# Patient Record
Sex: Male | Born: 1950
Health system: Southern US, Community
[De-identification: ages and names within clinical notes are randomized; demographics above are authoritative.]

## PROBLEM LIST (undated history)

## (undated) DIAGNOSIS — D759 Disease of blood and blood-forming organs, unspecified: Secondary | ICD-10-CM

## (undated) DIAGNOSIS — R200 Anesthesia of skin: Secondary | ICD-10-CM

## (undated) DIAGNOSIS — I2510 Atherosclerotic heart disease of native coronary artery without angina pectoris: Secondary | ICD-10-CM

## (undated) DIAGNOSIS — D696 Thrombocytopenia, unspecified: Secondary | ICD-10-CM

## (undated) DIAGNOSIS — L509 Urticaria, unspecified: Secondary | ICD-10-CM

## (undated) DIAGNOSIS — R339 Retention of urine, unspecified: Secondary | ICD-10-CM

## (undated) DIAGNOSIS — H269 Unspecified cataract: Secondary | ICD-10-CM

## (undated) DIAGNOSIS — R58 Hemorrhage, not elsewhere classified: Secondary | ICD-10-CM

## (undated) DIAGNOSIS — Z87438 Personal history of other diseases of male genital organs: Secondary | ICD-10-CM

## (undated) DIAGNOSIS — E785 Hyperlipidemia, unspecified: Secondary | ICD-10-CM

## (undated) DIAGNOSIS — K635 Polyp of colon: Secondary | ICD-10-CM

## (undated) DIAGNOSIS — F909 Attention-deficit hyperactivity disorder, unspecified type: Secondary | ICD-10-CM

## (undated) DIAGNOSIS — R0600 Dyspnea, unspecified: Secondary | ICD-10-CM

## (undated) DIAGNOSIS — G709 Myoneural disorder, unspecified: Secondary | ICD-10-CM

## (undated) DIAGNOSIS — Z87442 Personal history of urinary calculi: Secondary | ICD-10-CM

## (undated) DIAGNOSIS — N189 Chronic kidney disease, unspecified: Secondary | ICD-10-CM

## (undated) DIAGNOSIS — Z86718 Personal history of other venous thrombosis and embolism: Secondary | ICD-10-CM

## (undated) DIAGNOSIS — H5440 Blindness, one eye, unspecified eye: Secondary | ICD-10-CM

## (undated) DIAGNOSIS — T887XXA Unspecified adverse effect of drug or medicament, initial encounter: Secondary | ICD-10-CM

## (undated) DIAGNOSIS — T4145XA Adverse effect of unspecified anesthetic, initial encounter: Secondary | ICD-10-CM

## (undated) DIAGNOSIS — I10 Essential (primary) hypertension: Secondary | ICD-10-CM

## (undated) DIAGNOSIS — I499 Cardiac arrhythmia, unspecified: Secondary | ICD-10-CM

## (undated) DIAGNOSIS — T8859XA Other complications of anesthesia, initial encounter: Secondary | ICD-10-CM

## (undated) DIAGNOSIS — M1990 Unspecified osteoarthritis, unspecified site: Secondary | ICD-10-CM

## (undated) HISTORY — DX: Polyp of colon: K63.5

## (undated) HISTORY — PX: LAPAROTOMY: SHX154

## (undated) HISTORY — DX: Blindness, one eye, unspecified eye: H54.40

## (undated) HISTORY — DX: Thrombocytopenia, unspecified: D69.6

## (undated) HISTORY — DX: Anesthesia of skin: R20.0

## (undated) HISTORY — DX: Essential (primary) hypertension: I10

## (undated) HISTORY — DX: Urticaria, unspecified: L50.9

## (undated) HISTORY — DX: Unspecified adverse effect of drug or medicament, initial encounter: T88.7XXA

## (undated) HISTORY — DX: Hemorrhage, not elsewhere classified: R58

## (undated) HISTORY — DX: Other complications of anesthesia, initial encounter: T88.59XA

## (undated) HISTORY — PX: OTHER SURGICAL HISTORY: SHX169

## (undated) HISTORY — PX: COLONOSCOPY: SHX174

## (undated) HISTORY — DX: Hyperlipidemia, unspecified: E78.5

## (undated) HISTORY — DX: Personal history of urinary calculi: Z87.442

## (undated) HISTORY — DX: Unspecified osteoarthritis, unspecified site: M19.90

## (undated) HISTORY — DX: Personal history of other diseases of male genital organs: Z87.438

## (undated) HISTORY — DX: Adverse effect of unspecified anesthetic, initial encounter: T41.45XA

## (undated) HISTORY — DX: Myoneural disorder, unspecified: G70.9

## (undated) HISTORY — DX: Retention of urine, unspecified: R33.9

## (undated) HISTORY — DX: Unspecified cataract: H26.9

## (undated) HISTORY — DX: Chronic kidney disease, unspecified: N18.9

---
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
## 1982-03-02 HISTORY — PX: OTHER SURGICAL HISTORY: SHX169

## 2000-12-01 ENCOUNTER — Encounter: Admission: RE | Admit: 2000-12-01 | Discharge: 2000-12-01 | Payer: Self-pay | Admitting: Family Medicine

## 2000-12-01 ENCOUNTER — Encounter: Payer: Self-pay | Admitting: Family Medicine

## 2003-02-07 ENCOUNTER — Encounter: Payer: Self-pay | Admitting: Internal Medicine

## 2005-02-20 ENCOUNTER — Ambulatory Visit: Payer: Self-pay | Admitting: Internal Medicine

## 2005-02-27 ENCOUNTER — Ambulatory Visit: Payer: Self-pay | Admitting: Internal Medicine

## 2005-08-12 ENCOUNTER — Ambulatory Visit: Payer: Self-pay | Admitting: Internal Medicine

## 2005-12-10 ENCOUNTER — Ambulatory Visit: Payer: Self-pay | Admitting: Internal Medicine

## 2005-12-10 LAB — CONVERTED CEMR LAB
ALT: 32 units/L (ref 0–40)
AST: 31 units/L (ref 0–37)
Albumin: 4.2 g/dL (ref 3.5–5.2)
Alkaline Phosphatase: 91 units/L (ref 39–117)
BUN: 10 mg/dL (ref 6–23)
Basophils Absolute: 0 10*3/uL (ref 0.0–0.1)
Basophils Relative: 0.3 % (ref 0.0–1.0)
CO2: 30 meq/L (ref 19–32)
Calcium: 9.5 mg/dL (ref 8.4–10.5)
Chloride: 108 meq/L (ref 96–112)
Chol/HDL Ratio, serum: 4.9
Cholesterol: 177 mg/dL (ref 0–200)
Creatinine, Ser: 1.1 mg/dL (ref 0.4–1.5)
Eosinophil percent: 3.7 % (ref 0.0–5.0)
GFR calc non Af Amer: 74 mL/min
Glomerular Filtration Rate, Af Am: 90 mL/min/{1.73_m2}
Glucose, Bld: 97 mg/dL (ref 70–99)
HCT: 42.2 % (ref 39.0–52.0)
HDL: 35.8 mg/dL — ABNORMAL LOW (ref >39.0)
Hemoglobin: 14.4 g/dL (ref 13.0–17.0)
LDL Cholesterol: 125 mg/dL — ABNORMAL HIGH (ref 0–99)
Lymphocytes Relative: 27.1 % (ref 12.0–46.0)
MCHC: 34.2 g/dL (ref 30.0–36.0)
MCV: 91 fL (ref 78.0–100.0)
Monocytes Absolute: 0.3 10*3/uL (ref 0.2–0.7)
Monocytes Relative: 7.5 % (ref 3.0–11.0)
Neutro Abs: 2.5 10*3/uL (ref 1.4–7.7)
Neutrophils Relative %: 61.4 % (ref 43.0–77.0)
PSA: 0.41 ng/mL (ref 0.10–4.00)
Platelets: 145 10*3/uL — ABNORMAL LOW (ref 150–400)
Potassium: 4.6 meq/L (ref 3.5–5.1)
RBC: 4.63 M/uL (ref 4.22–5.81)
RDW: 12.6 % (ref 11.5–14.6)
Sodium: 143 meq/L (ref 135–145)
TSH: 1.68 microintl units/mL (ref 0.35–5.50)
Total Bilirubin: 0.7 mg/dL (ref 0.3–1.2)
Total Protein: 7.4 g/dL (ref 6.0–8.3)
Triglyceride fasting, serum: 79 mg/dL (ref 0–149)
VLDL: 16 mg/dL (ref 0–40)
WBC: 4.1 10*3/uL — ABNORMAL LOW (ref 4.5–10.5)

## 2005-12-16 ENCOUNTER — Ambulatory Visit: Payer: Self-pay | Admitting: Internal Medicine

## 2005-12-30 ENCOUNTER — Ambulatory Visit: Payer: Self-pay | Admitting: Gastroenterology

## 2006-01-04 ENCOUNTER — Ambulatory Visit: Payer: Self-pay | Admitting: Internal Medicine

## 2006-01-11 ENCOUNTER — Ambulatory Visit: Payer: Self-pay | Admitting: Gastroenterology

## 2006-01-11 ENCOUNTER — Encounter: Payer: Self-pay | Admitting: Internal Medicine

## 2006-02-24 ENCOUNTER — Ambulatory Visit: Payer: Self-pay | Admitting: Internal Medicine

## 2006-02-24 ENCOUNTER — Encounter: Admission: RE | Admit: 2006-02-24 | Discharge: 2006-02-24 | Payer: Self-pay | Admitting: Internal Medicine

## 2007-02-03 ENCOUNTER — Ambulatory Visit: Payer: Self-pay | Admitting: Internal Medicine

## 2007-02-03 DIAGNOSIS — I10 Essential (primary) hypertension: Secondary | ICD-10-CM | POA: Insufficient documentation

## 2007-02-03 DIAGNOSIS — Z87442 Personal history of urinary calculi: Secondary | ICD-10-CM

## 2007-07-04 ENCOUNTER — Ambulatory Visit: Payer: Self-pay | Admitting: Internal Medicine

## 2007-07-04 DIAGNOSIS — T887XXA Unspecified adverse effect of drug or medicament, initial encounter: Secondary | ICD-10-CM | POA: Insufficient documentation

## 2007-07-04 LAB — CONVERTED CEMR LAB
Bilirubin Urine: NEGATIVE
Blood in Urine, dipstick: NEGATIVE
Glucose, Urine, Semiquant: NEGATIVE
Ketones, urine, test strip: NEGATIVE
Nitrite: NEGATIVE
Protein, U semiquant: 30
Specific Gravity, Urine: >=1.03
Urobilinogen, UA: NEGATIVE
WBC Urine, dipstick: NEGATIVE
pH: 5

## 2007-09-14 ENCOUNTER — Ambulatory Visit: Payer: Self-pay | Admitting: Internal Medicine

## 2007-09-14 LAB — CONVERTED CEMR LAB
ALT: 26 units/L (ref 0–53)
AST: 26 units/L (ref 0–37)
Albumin: 3.9 g/dL (ref 3.5–5.2)
Alkaline Phosphatase: 82 units/L (ref 39–117)
BUN: 11 mg/dL (ref 6–23)
Basophils Absolute: 0 10*3/uL (ref 0.0–0.1)
Basophils Relative: 0.5 % (ref 0.0–1.0)
Bilirubin, Direct: 0.1 mg/dL (ref 0.0–0.3)
CO2: 30 meq/L (ref 19–32)
Calcium: 9.3 mg/dL (ref 8.4–10.5)
Chloride: 108 meq/L (ref 96–112)
Cholesterol: 178 mg/dL (ref 0–200)
Creatinine, Ser: 1.1 mg/dL (ref 0.4–1.5)
Eosinophils Absolute: 0.2 10*3/uL (ref 0.0–0.7)
Eosinophils Relative: 4.6 % (ref 0.0–5.0)
GFR calc Af Amer: 89 mL/min
GFR calc non Af Amer: 74 mL/min
Glucose, Bld: 103 mg/dL — ABNORMAL HIGH (ref 70–99)
HCT: 40.1 % (ref 39.0–52.0)
HDL: 34.5 mg/dL — ABNORMAL LOW (ref >39.0)
Hemoglobin: 13.9 g/dL (ref 13.0–17.0)
LDL Cholesterol: 124 mg/dL — ABNORMAL HIGH (ref 0–99)
Lymphocytes Relative: 24.7 % (ref 12.0–46.0)
MCHC: 34.7 g/dL (ref 30.0–36.0)
MCV: 90.8 fL (ref 78.0–100.0)
Monocytes Absolute: 0.4 10*3/uL (ref 0.1–1.0)
Monocytes Relative: 8.1 % (ref 3.0–12.0)
Neutro Abs: 2.9 10*3/uL (ref 1.4–7.7)
Neutrophils Relative %: 62.1 % (ref 43.0–77.0)
PSA: 0.29 ng/mL (ref 0.10–4.00)
Platelets: 139 10*3/uL — ABNORMAL LOW (ref 150–400)
Potassium: 4.3 meq/L (ref 3.5–5.1)
RBC: 4.42 M/uL (ref 4.22–5.81)
RDW: 13 % (ref 11.5–14.6)
Sodium: 144 meq/L (ref 135–145)
TSH: 1.95 microintl units/mL (ref 0.35–5.50)
Total Bilirubin: 1 mg/dL (ref 0.3–1.2)
Total CHOL/HDL Ratio: 5.2
Total Protein: 7.1 g/dL (ref 6.0–8.3)
Triglycerides: 97 mg/dL (ref 0–149)
VLDL: 19 mg/dL (ref 0–40)
WBC: 4.7 10*3/uL (ref 4.5–10.5)

## 2007-09-20 ENCOUNTER — Ambulatory Visit: Payer: Self-pay | Admitting: Internal Medicine

## 2007-09-20 DIAGNOSIS — E785 Hyperlipidemia, unspecified: Secondary | ICD-10-CM

## 2007-09-20 DIAGNOSIS — N400 Enlarged prostate without lower urinary tract symptoms: Secondary | ICD-10-CM

## 2008-03-16 ENCOUNTER — Ambulatory Visit: Payer: Self-pay | Admitting: Internal Medicine

## 2008-04-16 ENCOUNTER — Ambulatory Visit: Payer: Self-pay | Admitting: Internal Medicine

## 2008-04-16 DIAGNOSIS — R079 Chest pain, unspecified: Secondary | ICD-10-CM

## 2008-04-20 ENCOUNTER — Encounter: Payer: Self-pay | Admitting: Internal Medicine

## 2008-04-20 ENCOUNTER — Ambulatory Visit: Payer: Self-pay

## 2008-04-30 ENCOUNTER — Ambulatory Visit: Payer: Self-pay | Admitting: Internal Medicine

## 2008-08-09 ENCOUNTER — Ambulatory Visit: Payer: Self-pay | Admitting: Family Medicine

## 2008-08-09 DIAGNOSIS — R339 Retention of urine, unspecified: Secondary | ICD-10-CM

## 2008-08-09 DIAGNOSIS — L509 Urticaria, unspecified: Secondary | ICD-10-CM | POA: Insufficient documentation

## 2008-08-09 LAB — CONVERTED CEMR LAB
Bilirubin Urine: NEGATIVE
Blood in Urine, dipstick: NEGATIVE
Glucose, Urine, Semiquant: NEGATIVE
Ketones, urine, test strip: NEGATIVE
Nitrite: NEGATIVE
Protein, U semiquant: NEGATIVE
Specific Gravity, Urine: >=1.03
Urobilinogen, UA: 0.2
WBC Urine, dipstick: NEGATIVE
pH: 5

## 2008-08-30 ENCOUNTER — Ambulatory Visit: Payer: Self-pay | Admitting: Internal Medicine

## 2008-08-30 DIAGNOSIS — R109 Unspecified abdominal pain: Secondary | ICD-10-CM | POA: Insufficient documentation

## 2008-10-01 ENCOUNTER — Encounter: Payer: Self-pay | Admitting: Internal Medicine

## 2008-10-02 ENCOUNTER — Ambulatory Visit: Payer: Self-pay | Admitting: Cardiology

## 2008-10-16 ENCOUNTER — Encounter: Payer: Self-pay | Admitting: Cardiology

## 2008-10-16 ENCOUNTER — Encounter: Payer: Self-pay | Admitting: Internal Medicine

## 2008-10-23 ENCOUNTER — Telehealth: Payer: Self-pay | Admitting: Cardiology

## 2009-01-02 ENCOUNTER — Ambulatory Visit: Payer: Self-pay | Admitting: Cardiology

## 2009-01-30 ENCOUNTER — Encounter (INDEPENDENT_AMBULATORY_CARE_PROVIDER_SITE_OTHER): Payer: Self-pay | Admitting: *Deleted

## 2009-01-31 ENCOUNTER — Encounter: Payer: Self-pay | Admitting: Cardiology

## 2009-03-07 ENCOUNTER — Telehealth: Payer: Self-pay | Admitting: Cardiology

## 2009-03-08 ENCOUNTER — Encounter: Payer: Self-pay | Admitting: Cardiology

## 2009-04-18 ENCOUNTER — Telehealth: Payer: Self-pay | Admitting: Cardiology

## 2009-09-17 ENCOUNTER — Encounter: Payer: Self-pay | Admitting: Internal Medicine

## 2009-10-02 ENCOUNTER — Encounter: Payer: Self-pay | Admitting: Internal Medicine

## 2010-01-22 ENCOUNTER — Encounter: Payer: Self-pay | Admitting: Internal Medicine

## 2010-02-05 ENCOUNTER — Encounter: Payer: Self-pay | Admitting: Internal Medicine

## 2010-03-30 LAB — CONVERTED CEMR LAB
CRP, High Sensitivity: 2 (ref 0.00–5.00)
Cholesterol: 188 mg/dL (ref 0–200)
HDL: 41 mg/dL (ref >39.00)
LDL Cholesterol: 127 mg/dL — ABNORMAL HIGH (ref 0–99)
Total CHOL/HDL Ratio: 5
Triglycerides: 100 mg/dL (ref 0.0–149.0)
Troponin I: 0 ng/mL (ref <0.06)
VLDL: 20 mg/dL (ref 0.0–40.0)

## 2010-04-01 NOTE — Letter (Signed)
Summary: [facility]   Imported By: Maryln Gottron 09/24/2009 10:14:57  _____________________________________________________________________  External Attachment:    Type:   Image     Comment:   External Document

## 2010-04-01 NOTE — Progress Notes (Signed)
Summary: side effect from lipitor - rash  Phone Note Call from Patient Call back at Home Phone [phone] Call back at [phone]   Caller: Spouse Reason for Call: Talk to Nurse Details for Reason: Per pt wife calling, hubsand having side effect from medication , rash all over his body. pt on lipitor now. was on zocor before. inform wife i would send an urgent message around to nurse.  Initial call taken by: Lorne Skeens,  April 18, 2009 9:14 AM  Follow-up for Phone Call        I spoke with the pt's wife and the pt started Lipitor and has now developed a rash.  This rash is similar to what the pt developed while taking Zocor.  The pt did also have swelling around his eyes with this rash and the wife has notified the Allergist.  I told the pt's wife to stop Lipitor.    The pt also has follow-up labs (Lipid and Liver) on 05/14/09 and the pt's wife was wondering if he still needs this since he has had another reaction.  I will forward this message to Dr Shirlee Latch for review. Follow-up by: Julieta Gutting, RN, BSN,  April 18, 2009 9:50 AM     Appended Document: side effect from lipitor - rash Stop Lipitor.  Does not need lipids/LFTs now.  Has high lipoprotein(a) so CV risk is higher and would be good to be able to use statin.  Would have him ask his allergist if there is a statin that would be safe to take after rashes with Zocor and Lipitor and would ask if desensitization would be an option. [name]   Appended Document: side effect from lipitor - rash LMVM  Appended Document: side effect from lipitor - rash discussed with wife--pt off Lipitor and rash is improving--pt also stopped Norvasc on his own--pt to restart Norvasc and call back if rash does not continue to improve--pt will discuss other statin treatment with allergist--reviewed with Dr Shirlee Latch   Clinical Lists Changes  Medications: Removed medication of LIPITOR 10 MG TABS (ATORVASTATIN CALCIUM) one tablet daily

## 2010-04-01 NOTE — Progress Notes (Signed)
Summary: rash  Phone Note Call from Patient Call back at Home Phone [phone]   Caller: Patient Reason for Call: Talk to Nurse Summary of Call: pt believes he is having a reaction to meds.... rash on chest, legs and neck Initial call taken by: Migdalia Dk,  March 07, 2009 9:13 AM  Follow-up for Phone Call        spoke with pt, he has developed a rash that started "sunday. he is not taking any new meds or has not started any new soaps.  the rash is raised, red and itching. he states he had the same type of reaction in the past from lisinopril and it took several months for it to occur. will discuss with dr cooper DOD Debra Mathis, RN  March 07, 2009 9:34 AM   Additional Follow-up for Phone Call Additional follow up Details #1::        reviewed chart. pt has been on zocor, norvasc, and ASA for some time and is on no new meds. He will likely need to discontinue one medication at a time to find the culprit. Will forward to Dr McLean for review as this doesn't seem like an urgent issue. Additional Follow-up by: Michael David Cooper, MD,  March 07, 2009 4:14 PM     Appended Document: rash Have him hold Zocor for 5 days to see if the rash goes away.  Please call him after 5 days.  If rash does not resolve will restart Zocor and hold Norvasc  Appended Document: rash Pt. aware and verbalizes understanding.  Appended Document: rash talked with patient--pt feels rash is getting better although it hasn't completely resolved--I will follow-up with patient in 2 days to see if rash continues to improve  Appended Document: rash--Zocor talked with patient--rash almost completely resolved--no Zocor since January 6--pt asking if he should restart Zocor and see what happens--will forward to Dr McLean for review  Appended Document: rash Try taking Lipitor 10 mg daily instead.   Appended Document: rash talked with patient--he will start Lipitor 10mg daily--L/L profile  05-14-09   Clinical Lists Changes  Medications: Added new medication of LIPITOR 10 MG TABS (ATORVASTATIN CALCIUM) one tablet daily - Signed Rx of LIPITOR 10 MG TABS (ATORVASTATIN CALCIUM) one tablet daily;  #30 x 3;  Signed;  Entered by: Anne Lankford, RN, BSN;  Authorized by: Dalton McLean, MD;  Method [id number] [address], [place], [place]  [address], Ph: [phone] or [phone], Fax: [phone]    Prescriptions: LIPITOR 10 MG TABS (ATORVASTATIN CALCIUM) one tablet daily  #30 x 3   Entered by:   Anne Lankford, RN, BSN   Authorized by:   Dalton McLean, MD   Signed by:   Anne Lankford, RN, BSN on 03/18/2009   Method used:   [facility]        [id number]       [address]       [place], [place]  [address]       Ph: [phone] or [phone]       Fax: [phone]   RxID:   [id number]    Appended Document: rash    Clinical Lists Changes  Allergies: Added new allergy or adverse reaction of ZOCOR

## 2010-04-01 NOTE — Letter (Signed)
Summary: [facility]   Imported By: Maryln Gottron 10/21/2009 12:28:15  _____________________________________________________________________  External Attachment:    Type:   Image     Comment:   External Document

## 2010-04-01 NOTE — Letter (Signed)
Summary: [facility]   Imported By: Maryln Gottron 01/31/2010 12:30:39  _____________________________________________________________________  External Attachment:    Type:   Image     Comment:   External Document

## 2010-04-01 NOTE — Miscellaneous (Signed)
  Clinical Lists Changes  Medications: Removed medication of ZOCOR 40 MG TABS (SIMVASTATIN) 1 daily

## 2010-04-01 NOTE — Letter (Signed)
Summary: [facility]   Imported By: Kassie Mends 03/19/2009 08:57:08  _____________________________________________________________________  External Attachment:    Type:   Image     Comment:   External Document

## 2010-04-03 NOTE — Letter (Signed)
Summary: [facility]   Imported By: Lennie Odor 02/18/2010 15:31:58  _____________________________________________________________________  External Attachment:    Type:   Image     Comment:   External Document

## 2010-04-18 ENCOUNTER — Telehealth: Payer: Self-pay | Admitting: Cardiology

## 2010-04-23 NOTE — Progress Notes (Signed)
Summary: lab set up prior to appt  Phone Note Call from Patient Call back at Home Phone [phone]   Caller: [phone] Reason for Call: Talk to Nurse, Lab or Test Results Summary of Call: pt need lab work set up prior to office visit. Initial call taken by: Lorne Skeens,  April 18, 2010 9:24 AM  Follow-up for Phone Call        I discussed with pt-he has not had cholesterol done in the last 6 months--pt will come for lipid profile 05/12/10 prior to appt with Dr Shirlee Latch  05/15/10--pt states he cannot take STATINS

## 2010-05-12 ENCOUNTER — Encounter: Payer: Self-pay | Admitting: Cardiology

## 2010-05-12 ENCOUNTER — Other Ambulatory Visit: Payer: Self-pay | Admitting: Cardiology

## 2010-05-12 ENCOUNTER — Other Ambulatory Visit (INDEPENDENT_AMBULATORY_CARE_PROVIDER_SITE_OTHER): Payer: 59

## 2010-05-12 DIAGNOSIS — I10 Essential (primary) hypertension: Secondary | ICD-10-CM

## 2010-05-12 DIAGNOSIS — E785 Hyperlipidemia, unspecified: Secondary | ICD-10-CM

## 2010-05-12 LAB — LIPID PANEL: Total CHOL/HDL Ratio: 5

## 2010-05-15 ENCOUNTER — Encounter: Payer: Self-pay | Admitting: Cardiology

## 2010-05-15 ENCOUNTER — Ambulatory Visit (INDEPENDENT_AMBULATORY_CARE_PROVIDER_SITE_OTHER): Payer: 59 | Admitting: Cardiology

## 2010-05-15 DIAGNOSIS — I10 Essential (primary) hypertension: Secondary | ICD-10-CM

## 2010-05-15 DIAGNOSIS — E7800 Pure hypercholesterolemia, unspecified: Secondary | ICD-10-CM

## 2010-05-20 NOTE — Assessment & Plan Note (Signed)
Summary: F2y. per pt wife call. gd   Visit Type:  Follow-up Primary Provider:  Dr. Amador Cunas  CC:  Sob.  History of Present Illness: 60 yo with HTN and family history of premature CAD returns for evaluation.  He had a rash associated with both atorvastatin and simvastatin.  He went to see an allergist for the rash and it resolved with a steroid inection.  He was told not to take statins.   In general, he is doing fairly well symptomatically.  No exertional chest pain.  He gets short of breath if he carries a load up a flight of steps but does fine when he climbs steps empty-handed.  No formal exercise but does a lot of yardwork.  BP at home is still running high, usually in the 140s/90s.  Patient also reports a distressed mood.  He has had problems with this for a number of  years.  Finally, he has been undergoing a rheumatological workup that has suggested that he has psoriatic arthritis  Labs (8/10): lipoprotein (a) 65, LLD 127, HDL 41 Labs (3/12): LDL 117, HDL 39  ECG: NSR, borderline LVH  Current Medications (verified): 1)  Norvasc 5 Mg Tabs (Amlodipine Besylate) .[name].. 1 Daily 2)  Aspirin Low Dose 81 Mg Tabs (Aspirin) .... Once Daily 3)  Fish Oil 1000 Mg Caps (Omega-3 Fatty Acids) .... Take 1 Capsule By Mouth Once A Day 4)  Coq10 100 Mg Caps (Coenzyme Q10) .... Take 1 Capsule By Mouth Once A Day 5)  Methotrexate 2.5 Mg Tabs (Methotrexate Sodium) .... 6 Tablets Once A Week 6)  Folic Acid 1 Mg Tabs (Folic Acid) .... Take 1 Tablet By Mouth Once A Day  Allergies: 1)  ! Penicillin G Sodium (Penicillin G Sodium) 2)  ! * Bystolic 3)  ! Lisinopril 4)  ! * Doxazosin 5)  ! Zocor  Past History:  Past Medical History: 1.  Hypertension: rash with nebivolol, depressed/ED with lisinopril 2.  Nephrolithiasis, hx of 1986 3.  blind  right eye, secondary to traumatic cataract 4.  history of  thrombocytopenia 5.  Hyperlipidemia with elevated lipoprotein (a): Rash with simvastatin and  atorvastatin.  6.  BPH 7.  ETT-myoview (2/10): 11'01", EF 58%, no ischemia/infarction 8. Psoriatic arthritis  Family History: Reviewed history from 09/24/2008 and no changes required. father died age 44, status post CABG at age 83.  History tobacco use, and diabetes Mother died age 43, COPD maternal aunt died at 7, CAD one brother  Social History: Reviewed history from 10/02/2008 and no changes required. Married twins age 60 Regular exercise-no Works as Chief Technology Officer.   Review of Systems       All systems reviewed and negative except as per HPI.   Vital Signs:  Patient profile:   60 year old male Height:      72 inches Weight:      230 pounds BMI:     31.31 Pulse rate:   55 / minute Pulse rhythm:   regular Resp:     18 per minute BP sitting:   140 / 96  (left arm) Cuff size:   large  Vitals Entered By: Vikki Ports (May 15, 2010 8:47 AM)  Physical Exam  General:  Well developed, well nourished, in no acute distress. Neck:  Neck supple, no JVD. No masses, thyromegaly or abnormal cervical nodes. Lungs:  Clear bilaterally to auscultation and percussion. Heart:  Non-displaced PMI, chest non-tender; regular rate and rhythm, S1, S2 without murmurs, rubs.  +S4.  Carotid upstroke normal, no bruit.  Pedals normal pulses. No edema, no varicosities. Abdomen:  Bowel sounds positive; abdomen soft and non-tender without masses, organomegaly, or hernias noted. No hepatosplenomegaly. Extremities:  No clubbing or cyanosis. Neurologic:  Alert and oriented x 3. Psych:  Normal affect.   Impression & Recommendations:  Problem # 1:  HYPERTENSION (ICD-401.9) BP still too high.  I will have him increase amlodipine to 10 mg daily.    Problem # 2:  HYPERLIPIDEMIA (ICD-272.4) Given his strong family history of premature CAD and his high lipoprotein(a), I think that we should aim for LDL < 100 in this patient.  He had severe rashes with both Lipitor and Zocor and was told by his  allergist not to take statins anymore.  I will instead start him on Niaspan at 500 mg daily.  If he tolerates this, increase to 1000 mg daily.  Check lipids/LFTs in 2 months.   Patient Instructions: 1)  Your physician has recommended you make the following change in your medication:  2)  Increase Amlodipine to 10mg  daily. 3)  Start Niaspan 500mg  at bedtime 30 minutes after you take aspirin-if you tolerate this dose in  1 week increase to 1000mg  (two 500mg  tablets) at bedtime after you take aspirin.  4)  Your physician recommends that you return for a FASTING lipid profile in 2 months---272.0  401.9 5)  Your physician wants you to follow-up in: 1 year with Dr Shirlee Latch.[facility] 2013)   You will receive a reminder letter in the mail two months in advance. If you don't receive a letter, please call our office to schedule the follow-up appointment. Prescriptions: NIASPAN 500 MG CR-TABS (NIACIN (ANTIHYPERLIPIDEMIC)) one at bedtime  30 minutes after you take aspirin-in 1 week increase to two tablets at bedtime 30 minutes after you take aspirin  #60 x 6   Entered by:   Katina Dung, RN, BSN   Authorized by:   Marca Ancona, MD   Signed by:   Katina Dung, RN, BSN on 05/15/2010   Method used:   [facility]  [place] [address] [facility]       [address]       [place], [place]  [address]       Ph: [phone] or [phone]       Fax: [phone]   RxID:   [id number] AMLODIPINE BESYLATE 10 MG TABS (AMLODIPINE BESYLATE) one daily  #30 x 6   Entered by:   Katina Dung, RN, BSN   Authorized by:   Marca Ancona, MD   Signed by:   Katina Dung, RN, BSN on 05/15/2010   Method used:   [facility]  [place] [address]       [address]       [place], [place]  [address]       Ph: [phone] or [phone]       Fax: [phone]   RxID:   [id number]

## 2010-06-01 ENCOUNTER — Other Ambulatory Visit: Payer: Self-pay | Admitting: Cardiology

## 2010-07-14 ENCOUNTER — Encounter: Payer: Self-pay | Admitting: Cardiology

## 2010-07-14 ENCOUNTER — Encounter: Payer: Self-pay | Admitting: *Deleted

## 2010-07-16 ENCOUNTER — Other Ambulatory Visit (INDEPENDENT_AMBULATORY_CARE_PROVIDER_SITE_OTHER): Payer: 59 | Admitting: *Deleted

## 2010-07-16 DIAGNOSIS — E7800 Pure hypercholesterolemia, unspecified: Secondary | ICD-10-CM

## 2010-07-16 DIAGNOSIS — I10 Essential (primary) hypertension: Secondary | ICD-10-CM

## 2010-07-16 LAB — LIPID PANEL
LDL Cholesterol: 84 mg/dL (ref 0–99)
Total CHOL/HDL Ratio: 3
Triglycerides: 85 mg/dL (ref 0.0–149.0)
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
## 2011-07-13 ENCOUNTER — Telehealth: Payer: Self-pay | Admitting: Cardiology

## 2011-07-13 MED ORDER — AMLODIPINE BESYLATE 10 MG PO TABS: 10.0000 mg | ORAL_TABLET | Freq: Every day | ORAL | Status: DC

## 2011-07-13 NOTE — Telephone Encounter (Signed)
Pt needs refill on amlodipine 10 mg, uses [facility] [place]

## 2011-07-13 NOTE — Telephone Encounter (Signed)
Sent to [facility] [place]--pt needs office visit before additional refills

## 2011-08-18 ENCOUNTER — Encounter: Payer: Self-pay | Admitting: *Deleted

## 2011-08-27 ENCOUNTER — Ambulatory Visit (INDEPENDENT_AMBULATORY_CARE_PROVIDER_SITE_OTHER): Payer: 59 | Admitting: Cardiology

## 2011-08-27 ENCOUNTER — Encounter: Payer: Self-pay | Admitting: Cardiology

## 2011-08-27 VITALS — BP 124/80 | HR 52 | Ht 72.0 in | Wt 228.8 lb

## 2011-08-27 DIAGNOSIS — R5383 Other fatigue: Secondary | ICD-10-CM

## 2011-08-27 DIAGNOSIS — R5381 Other malaise: Secondary | ICD-10-CM

## 2011-08-27 DIAGNOSIS — R0602 Shortness of breath: Secondary | ICD-10-CM

## 2011-08-27 DIAGNOSIS — I10 Essential (primary) hypertension: Secondary | ICD-10-CM

## 2011-08-27 DIAGNOSIS — E785 Hyperlipidemia, unspecified: Secondary | ICD-10-CM

## 2011-08-27 MED ORDER — LISINOPRIL-HYDROCHLOROTHIAZIDE 20-12.5 MG PO TABS: 1.0000 | ORAL_TABLET | Freq: Every day | ORAL | Status: DC

## 2011-08-27 NOTE — Patient Instructions (Signed)
Your physician has requested that you have an exercise stress myoview. For further information please visit [email or web address] Please follow instruction sheet, as given.  Your physician wants you to follow-up in: 1 year with Dr. Shirlee Latch.  You will receive a reminder letter in the mail two months in advance. If you don't  receive a letter, please call our office to schedule the follow-up appointment.  Start taking Red Yeast Rice, can be purchased at [facility].  Stop taking Amlodipine.  Start Lisinopril/HCTZ 20/12.5mg  daily.  Your physician recommends that you return for lab work in: 10 days (09/07/2011) for:  Lipids/bmet/tsh  Thurston Hole will call you in about 1 week to follow up on blood pressure.

## 2011-08-28 DIAGNOSIS — R0609 Other forms of dyspnea: Secondary | ICD-10-CM | POA: Insufficient documentation

## 2011-08-28 DIAGNOSIS — R5383 Other fatigue: Secondary | ICD-10-CM | POA: Insufficient documentation

## 2011-08-28 NOTE — Progress Notes (Signed)
Patient ID: Willie Dominguez, male   DOB: February 01, 1951, 61 y.o.   MRN: [id number] PCP: Dr. Amador Cunas  61 yo with HTN and family history of premature CAD returns for evaluation.  Since I last saw him, he had to stop Niaspan due to an allergic reaction.  He also has not been able to take statins.  BP has been under control on amlodipine but he has developed peripheral edema that is at times painfully tight.  He has generalized fatigue.  He is short of breath walking up hills or up inclines.  His wife is worried that this seems to be becoming worse.  He gets pain under the left armpit on occasion when he is working in his yard.  No chest pain.    Labs (8/10): lipoprotein (a) 65, LLD 127, HDL 41  Labs (3/12): LDL 117, HDL 39  Labs (5/12): LDL 84, HDL 47  Allergies:  1) ! Penicillin G Sodium (Penicillin G Sodium)  2) ! * Bystolic  3) ! Lisinopril  4) ! * Doxazosin  5) ! Zocor   Past Medical History:  1. Hypertension: rash with nebivolol 2. Nephrolithiasis, hx of 1986  3. blind right eye, secondary to traumatic cataract  4. history of thrombocytopenia  5. Hyperlipidemia with elevated lipoprotein (a): Rash with simvastatin and atorvastatin. Unable to tolerate Niaspan.  6. BPH  7. ETT-myoview (2/10): 11'01", EF 58%, no ischemia/infarction  8. Psoriatic arthritis   Family History:  father died age 22, status post CABG at age 67. History tobacco use, and diabetes  Mother died age 37, COPD  maternal aunt died at 29, CAD  one brother   Social History:  Married  twins Regular exercise-no  Works as Chief Technology Officer.   Review of Systems  All systems reviewed and negative except as per HPI.

## 2011-08-28 NOTE — Assessment & Plan Note (Signed)
BP well-controlled with amlodipine but has developed lower extremity edema.  Stop amlodipine, try lisinopril/HCTZ 20/12.5 daily.  Needs BP check and BMET in 2 wks.

## 2011-08-28 NOTE — Assessment & Plan Note (Signed)
Will check lipids.  Patient has not tolerated Niaspan or several statins.  He does have a strong family history of CAD and elevated Lp(a).  He asks today about red yeast rice extract.  I think this would be reasonable to try.

## 2011-08-28 NOTE — Assessment & Plan Note (Signed)
Will additionally get CBC and TSH.

## 2011-08-28 NOTE — Assessment & Plan Note (Signed)
Somewhat increased exertional dyspnea over the last few months.  The patient's wife has noticed and is concerned.  Also, gets pain under left armpit with exertion.  Given his risk factors, I will arrange for ETT-myoview to assess for evidence of significant coronary disease.  He should continue ASA 81 mg daily.

## 2011-09-08 ENCOUNTER — Ambulatory Visit (HOSPITAL_COMMUNITY): Payer: 59 | Attending: Cardiology | Admitting: Radiology

## 2011-09-08 ENCOUNTER — Other Ambulatory Visit (INDEPENDENT_AMBULATORY_CARE_PROVIDER_SITE_OTHER): Payer: 59

## 2011-09-08 VITALS — BP 123/82 | Ht 72.0 in | Wt 225.0 lb

## 2011-09-08 DIAGNOSIS — R0989 Other specified symptoms and signs involving the circulatory and respiratory systems: Secondary | ICD-10-CM | POA: Insufficient documentation

## 2011-09-08 DIAGNOSIS — R5381 Other malaise: Secondary | ICD-10-CM | POA: Insufficient documentation

## 2011-09-08 DIAGNOSIS — Z8249 Family history of ischemic heart disease and other diseases of the circulatory system: Secondary | ICD-10-CM | POA: Insufficient documentation

## 2011-09-08 DIAGNOSIS — R0602 Shortness of breath: Secondary | ICD-10-CM

## 2011-09-08 DIAGNOSIS — M79609 Pain in unspecified limb: Secondary | ICD-10-CM | POA: Insufficient documentation

## 2011-09-08 DIAGNOSIS — E785 Hyperlipidemia, unspecified: Secondary | ICD-10-CM

## 2011-09-08 DIAGNOSIS — I10 Essential (primary) hypertension: Secondary | ICD-10-CM | POA: Insufficient documentation

## 2011-09-08 DIAGNOSIS — R110 Nausea: Secondary | ICD-10-CM | POA: Insufficient documentation

## 2011-09-08 DIAGNOSIS — R079 Chest pain, unspecified: Secondary | ICD-10-CM

## 2011-09-08 DIAGNOSIS — R0609 Other forms of dyspnea: Secondary | ICD-10-CM | POA: Insufficient documentation

## 2011-09-08 DIAGNOSIS — R0789 Other chest pain: Secondary | ICD-10-CM | POA: Insufficient documentation

## 2011-09-08 LAB — BASIC METABOLIC PANEL
BUN: 19 mg/dL (ref 6–23)
GFR: 71.67 mL/min (ref >60.00)
Potassium: 4.9 mEq/L (ref 3.5–5.1)
Sodium: 140 mEq/L (ref 135–145)

## 2011-09-08 LAB — LIPID PANEL
LDL Cholesterol: 116 mg/dL — ABNORMAL HIGH (ref 0–99)
Total CHOL/HDL Ratio: 4
VLDL: 15.8 mg/dL (ref 0.0–40.0)

## 2011-09-08 LAB — TSH: TSH: 2.88 u[IU]/mL (ref 0.35–5.50)

## 2011-09-08 MED ORDER — TECHNETIUM TC 99M TETROFOSMIN IV KIT
30.0000 | PACK | Freq: Once | INTRAVENOUS | Status: AC | PRN
  Administered 2011-09-08: 30 via INTRAVENOUS

## 2011-09-08 MED ORDER — TECHNETIUM TC 99M TETROFOSMIN IV KIT
10.0000 | PACK | Freq: Once | INTRAVENOUS | Status: AC | PRN
  Administered 2011-09-08: 10 via INTRAVENOUS

## 2011-09-08 NOTE — Progress Notes (Addendum)
[facility] [place] SITE 3 NUCLEAR MED [address] [place] [address] [phone]  Cardiology Nuclear Med Study  Willie Dominguez is a 61 y.o. male     MRN : [id number]     DOB: September 09, 1950  Procedure Date: 09/08/2011  Nuclear Med Background Indication for Stress Test:  Evaluation for Ischemia History:  '10 MPS:EF=58%,no ischemia/scar Cardiac Risk Factors: Family History - CAD, Hypertension and Lipids  Symptoms:  Chest Pain/Tightness at Rest/ with Exertion (last date of chest discomfort month ago),Left arm pit pain/under ribs with exertion, DOE, Fatigue and Nausea   Nuclear Pre-Procedure Caffeine/Decaff Intake:  None > 12 hrs NPO After: 10:00pm   Lungs:  clear  IV 0.9% NS with Angio Cath:  20g  IV Site: R Antecubital x 1, tolerated well IV Started by:  Irean Hong, RN  Chest Size (in):  44 Cup Size: n/a  Height: 6' (1.829 m)  Weight:  225 lb (102.059 kg)  BMI:  Body mass index is 30.52 kg/(m^2). Tech Comments:  n/a    Nuclear Med Study 1 or 2 day study: 1 day  Stress Test Type:  Stress  Reading MD: Marca Ancona, MD  Order Authorizing Provider:  Marca Ancona, MD  Resting Radionuclide: Technetium 55m Tetrofosmin  Resting Radionuclide Dose: 11.0 mCi   Stress Radionuclide:  Technetium 51m Tetrofosmin  Stress Radionuclide Dose: 32.4 mCi           Stress Protocol Rest HR: 49 Stress HR: 148  Rest BP: 123/82 Stress BP: 167/66  Exercise Time (min): 10:30 METS: 12.30   Predicted Max HR: 160 bpm % Max HR: 92.5 bpm Rate Pressure Product: 95284   Dose of Adenosine (mg):  n/a Dose of Lexiscan: n/a mg  Dose of Atropine (mg): n/a Dose of Dobutamine: n/a mcg/kg/min (at max HR)  Stress Test Technologist: Cathlyn Parsons, RN  Nuclear Technologist:  Domenic Polite, CNMT     Rest Procedure:  Myocardial perfusion imaging was performed at rest 45 minutes following the intravenous administration of Technetium 79m Tetrofosmin. Rest ECG: Sinus Bradycardia  Stress  Procedure:  The patient performed treadmill exercise using a Bruce  Protocol for 10:30 minutes. The patient stopped due to fatigue and target HR achieved. Patient had feeling in his chest which he described as a sensation;not pain. Patient became dizzy and had tunnel vision in recovery.   There were no significant ST-T wave changes. Patient had occasional PVC's. Technetium 76m Tetrofosmin was injected at peak exercise and myocardial perfusion imaging was performed after a brief delay. Stress ECG: No significant change from baseline ECG  QPS Raw Data Images:  Normal; no motion artifact; normal heart/lung ratio. Stress Images:  Normal homogeneous uptake in all areas of the myocardium. Rest Images:  Normal homogeneous uptake in all areas of the myocardium. Subtraction (SDS):  There is no evidence of scar or ischemia. Transient Ischemic Dilatation (Normal <1.22):  0.84 Lung/Heart Ratio (Normal <0.45):  0.32  Quantitative Gated Spect Images QGS EDV:  111 ml QGS ESV:  46 ml  Impression Exercise Capacity:  Excellent exercise capacity. BP Response:  Normal blood pressure response. Clinical Symptoms:  Mild chest discomfort and dyspnea.  ECG Impression:  No significant changes.  Comparison with Prior Nuclear Study: No significant change from previous study  Overall Impression:  Normal stress nuclear study.  LV Ejection Fraction: 59%.  LV Wall Motion:  NL LV Function; NL Wall Motion  Marca Ancona 09/08/2011  Normal study.  Please inform patient.   Willie Dominguez [employer]  09/10/2011       

## 2011-09-10 ENCOUNTER — Telehealth: Payer: Self-pay | Admitting: *Deleted

## 2011-09-10 NOTE — Progress Notes (Signed)
LMTCB at home # / mobile # not working

## 2011-09-10 NOTE — Telephone Encounter (Signed)
HYPERTENSION - Marca Ancona, MD 08/28/2011 12:09 AM Signed  BP well-controlled with amlodipine but has developed lower extremity edema. Stop amlodipine, try lisinopril/HCTZ 20/12.5 daily. Needs BP check and BMET in 2 wks.    09/10/11 LMTCB home / mobile # not working.

## 2011-09-10 NOTE — Progress Notes (Signed)
LMTCB-home #

## 2011-09-10 NOTE — Telephone Encounter (Signed)
Pt rtn call, pls call [phone]

## 2011-09-10 NOTE — Telephone Encounter (Signed)
LMTCB

## 2011-09-10 NOTE — Telephone Encounter (Signed)
LMTCB-home #

## 2011-09-10 NOTE — Progress Notes (Signed)
LMTCB

## 2011-09-11 ENCOUNTER — Other Ambulatory Visit: Payer: Self-pay | Admitting: *Deleted

## 2011-09-11 NOTE — Telephone Encounter (Signed)
Fu call °Patient returning your call °

## 2011-09-11 NOTE — Progress Notes (Signed)
Pt.notified

## 2011-09-11 NOTE — Telephone Encounter (Signed)
Spoke with pt about recent test results 

## 2011-09-18 ENCOUNTER — Telehealth: Payer: Self-pay | Admitting: *Deleted

## 2011-09-18 NOTE — Telephone Encounter (Signed)
09/18/11 spoke with pt. Recent BP readings: 08/29/11  130/74 54  08/30/11 125/70 53  08/31/11 128/80 53 09/01/11 132/77 55 09/02/11 121/68 59 09/04/11 131/86 56 09/05/11 137/74 53 09/06/11 125/68 62 09/07/11 143/88 53 09/09/11 127/80 65 09/11/11 152/57 57 I will forward to Dr Shirlee Latch for review.   HYPERTENSION - Marca Ancona, MD 08/28/2011 12:09 AM Signed  BP well-controlled with amlodipine but has developed lower extremity edema. Stop amlodipine, try lisinopril/HCTZ 20/12.5 daily. Needs BP check and BMET in 2 wks

## 2011-09-21 NOTE — Telephone Encounter (Signed)
BPs ok 

## 2011-09-23 NOTE — Telephone Encounter (Signed)
Spoke with pt

## 2011-10-11 ENCOUNTER — Other Ambulatory Visit: Payer: Self-pay | Admitting: Cardiology

## 2011-10-12 ENCOUNTER — Other Ambulatory Visit: Payer: Self-pay | Admitting: *Deleted

## 2012-02-11 ENCOUNTER — Ambulatory Visit (INDEPENDENT_AMBULATORY_CARE_PROVIDER_SITE_OTHER): Payer: 59 | Admitting: Internal Medicine

## 2012-02-11 ENCOUNTER — Encounter: Payer: Self-pay | Admitting: Internal Medicine

## 2012-02-11 VITALS — BP 138/80 | HR 68 | Temp 97.8°F | Resp 18 | Ht 71.0 in | Wt 228.0 lb

## 2012-02-11 DIAGNOSIS — Z0000 Encounter for general adult medical examination without abnormal findings: Secondary | ICD-10-CM

## 2012-02-11 MED ORDER — LISINOPRIL-HYDROCHLOROTHIAZIDE 20-12.5 MG PO TABS: 1.0000 | ORAL_TABLET | Freq: Every day | ORAL | Status: DC

## 2012-02-11 MED ORDER — LOSARTAN POTASSIUM 100 MG PO TABS: 100.0000 mg | ORAL_TABLET | Freq: Every day | ORAL | Status: DC

## 2012-02-11 NOTE — Progress Notes (Signed)
Subjective:    Patient ID: Willie Dominguez Roles, male    DOB: Jul 07, 1950, 61 y.o.   MRN: [id number]  HPI  61 year old patient who is seen today for a health maintenance examination. He has been seen by cardiology early this year and did have a negative nuclear stress test performed. He has treated hypertension. He has been on his present medication for about 6 months and over this time he has had some mild the numbness involving the toes. He has seen urology due to history of kidney stones. Today he has done fairly well. He does have a history of mild dyslipidemia but has been intolerant to statins. Generally done well today. He did have a colonoscopy in 2007. He does have a history of psoriatic arthritis and has seen rheumatology recently  Past Medical History  Diagnosis Date  . Unspecified essential hypertension   . Personal history of urinary calculi   . 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 Thrombocytopenia   . Other and unspecified hyperlipidemia      with elevated lipoprotein (a)  . History of BPH   . Arthritis   . H/O benign prostatic hypertrophy     mild  . Unspecified adverse effect of unspecified drug, medicinal and biological substance   . Retention of urine, unspecified   . Urticaria, unspecified     History   Social History  . Marital Status: Married    Spouse Name: N/A    Number of Children: 2  . Years of Education: N/A   Occupational History  . Chief Technology Officer    Social History Main Topics  . Smoking status: Never Smoker   . Smokeless tobacco: Not on file  . Alcohol Use: No  . Drug Use: No  . Sexually Active: Not on file   Other Topics Concern  . Not on file   Social History Narrative   Marriedtwins age 7Regular exercise-no    Past Surgical History  Procedure Date  . Laparotomy   . Inguinal herniorrhaphy  age 56   . Colonoscopy november 2007     Family History  Problem Relation Age of Onset  . COPD Mother   . Coronary artery  disease Maternal Aunt   . Diabetes Father   . Other Father     CABG    Allergies  Allergen Reactions  . Methotrexate Derivatives Other (See Comments)    Mouth ulcers  . Bystolic (Nebivolol Hcl)   . Doxazosin     REACTION: aggrivated prostate, weakend stream, bladder pain and irritation  . Lisinopril     REACTION: Intolerance.  Depressive mood, fatigue  . Penicillins     REACTION: rash  . Simvastatin     REACTION: rash  . Statins     Breaks out in full body rash with itching and heat    Current Outpatient Prescriptions on File Prior to Visit  Medication Sig Dispense Refill  . aspirin 81 MG tablet Take 81 mg by mouth daily.        . Coenzyme Q10 (COQ-10) 100 MG CAPS 1 tab po qd       . lisinopril-hydrochlorothiazide (PRINZIDE,ZESTORETIC) 20-12.5 MG per tablet Take 1 tablet by mouth daily.  30 tablet  11  . Omega-3 Fatty Acids (FISH OIL) 1000 MG CAPS 1 tab po qd       . Red Yeast Rice Extract 600 MG CAPS Two daily        BP 138/80  Pulse  68  Temp 97.8 F (36.6 C) (Oral)  Resp 18  Ht 5\' 11"  (1.803 m)  Wt 228 lb (103.42 kg)  BMI 31.80 kg/m2  SpO2 99%       Review of Systems  Constitutional: Negative for fever, chills, activity change, appetite change and fatigue.  HENT: Negative for hearing loss, ear pain, congestion, rhinorrhea, sneezing, mouth sores, trouble swallowing, neck pain, neck stiffness, dental problem, voice change, sinus pressure and tinnitus.   Eyes: Negative for photophobia, pain, redness and visual disturbance.  Respiratory: Negative for apnea, cough, choking, chest tightness, shortness of breath and wheezing.   Cardiovascular: Negative for chest pain, palpitations and leg swelling.  Gastrointestinal: Negative for nausea, vomiting, abdominal pain, diarrhea, constipation, blood in stool, abdominal distention, anal bleeding and rectal pain.  Genitourinary: Negative for dysuria, urgency, frequency, hematuria, flank pain, decreased urine volume, discharge,  penile swelling, scrotal swelling, difficulty urinating, genital sores and testicular pain.  Musculoskeletal: Negative for myalgias, back pain, joint swelling, arthralgias and gait problem.  Skin: Negative for color change, rash and wound.  Neurological: Positive for numbness. Negative for dizziness, tremors, seizures, syncope, facial asymmetry, speech difficulty, weakness, light-headedness and headaches.  Hematological: Negative for adenopathy. Does not bruise/bleed easily.  Psychiatric/Behavioral: Negative for suicidal ideas, hallucinations, behavioral problems, confusion, sleep disturbance, self-injury, dysphoric mood, decreased concentration and agitation. The patient is not nervous/anxious.        Objective:   Physical Exam  Constitutional: He appears well-developed and well-nourished.  HENT:  Head: Normocephalic and atraumatic.  Right Ear: External ear normal.  Left Ear: External ear normal.  Nose: Nose normal.  Mouth/Throat: Oropharynx is clear and moist.  Eyes: Conjunctivae normal and EOM are normal. Pupils are equal, round, and reactive to light. No scleral icterus.  Neck: Normal range of motion. Neck supple. No JVD present. No thyromegaly present.  Cardiovascular: Regular rhythm, normal heart sounds and intact distal pulses.  Exam reveals no gallop and no friction rub.   No murmur heard. Pulmonary/Chest: Effort normal and breath sounds normal. He exhibits no tenderness.  Abdominal: Soft. Bowel sounds are normal. He exhibits no distension and no mass. There is no tenderness.  Genitourinary: Prostate normal and penis normal.  Musculoskeletal: Normal range of motion. He exhibits no edema and no tenderness.  Lymphadenopathy:    He has no cervical adenopathy.  Neurological: He is alert. He has normal reflexes. No cranial nerve deficit. Coordination normal.  Skin: Skin is warm and dry. No rash noted.  Psychiatric: He has a normal mood and affect. His behavior is normal.           Assessment & Plan:   Preventive health examination Nonspecific paresthesias. Patient feels this is related to his present blood pressure medication. We'll substitute we'll start him for 30 days to see if there is any effect Hypertension well controlled Home blood pressure readings recommended recheck here in one year or as needed

## 2012-02-11 NOTE — Patient Instructions (Signed)

## 2013-02-14 ENCOUNTER — Other Ambulatory Visit: Payer: Self-pay | Admitting: Internal Medicine

## 2013-04-17 ENCOUNTER — Ambulatory Visit (INDEPENDENT_AMBULATORY_CARE_PROVIDER_SITE_OTHER): Payer: 59 | Admitting: Neurology

## 2013-04-17 ENCOUNTER — Encounter: Payer: Self-pay | Admitting: Neurology

## 2013-04-17 VITALS — BP 136/88 | HR 63 | Ht 72.0 in | Wt 259.0 lb

## 2013-04-17 DIAGNOSIS — G609 Hereditary and idiopathic neuropathy, unspecified: Secondary | ICD-10-CM | POA: Insufficient documentation

## 2013-04-17 NOTE — Progress Notes (Signed)
PATIENT: Willie Dominguez DOB: [birth year]  HISTORICAL  Willie Dominguez is a 63 years old right-handed Caucasian male, referred by his primary care physician Dr. Laveda Abbe for evaluation of bilateral feet paresthesia  He had past medical history of hypertension, about a year and half ago, in the summer of 2013, he began to notice bilateral toes numbness tingling, gradually worse over the past 18 months, becomes constant more pronounced numbness, and also very bothersome sometimes, burning pain, wearing socks, shoes, become irritable,  Over the past couple weeks, he also redescribe easily losing his footing, lost balance easily,   he denies significant low back pain, he denie bowel bladder incontinence,  He denies bilateral hands paresthesia, no neck pain,  Reported recent laboratory evaluation showed normal CMP, folic acid low vitamin D, and low vitamin B12, was put on by mouth supplement,  REVIEW OF SYSTEMS: Full 14 system review of systems performed and notable only for right eye blindness weight gain, ringing in ears  ALLERGIES: Allergies  Allergen Reactions  . Methotrexate Derivatives Other (See Comments)    Mouth ulcers  . Bystolic [Nebivolol Hcl]   . Doxazosin     REACTION: aggrivated prostate, weakend stream, bladder pain and irritation  . Lisinopril     REACTION: Intolerance.  Depressive mood, fatigue  . Penicillins     REACTION: rash  . Simvastatin     REACTION: rash  . Statins     Breaks out in full body rash with itching and heat    HOME MEDICATIONS: Current Outpatient Prescriptions on File Prior to Visit  Medication Sig Dispense Refill  . aspirin 81 MG tablet Take 81 mg by mouth daily.        . Coenzyme Q10 (COQ-10) 100 MG CAPS 1 tab po qd       . Omega-3 Fatty Acids (FISH OIL) 1000 MG CAPS 1 tab po qd       . Red Yeast Rice Extract 600 MG CAPS Two daily      . lisinopril-hydrochlorothiazide (PRINZIDE,ZESTORETIC) 20-12.5 MG per tablet Take 1 tablet  by mouth daily.  30 tablet  11   No current facility-administered medications on file prior to visit.    PAST MEDICAL HISTORY: Past Medical History  Diagnosis Date  . Unspecified essential hypertension   . Personal history of urinary calculi   . 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 Thrombocytopenia   . Other and unspecified hyperlipidemia      with elevated lipoprotein (a)  . History of BPH   . Arthritis   . H/O benign prostatic hypertrophy     mild  . Unspecified adverse effect of unspecified drug, medicinal and biological substance   . Retention of urine, unspecified   . Urticaria, unspecified   . High blood pressure   . Numbness of toes     PAST SURGICAL HISTORY: Past Surgical History  Procedure Laterality Date  . Laparotomy    . Inguinal herniorrhaphy  age 22    . Colonoscopy november 2007      FAMILY HISTORY: Family History  Problem Relation Age of Onset  . COPD Mother   . Coronary artery disease Maternal Aunt   . Diabetes Father   . Other Father     CABG  . Lung cancer Mother   . Heart Problems Father     SOCIAL HISTORY:  History   Social History  . Marital Status: Married    Spouse  Name: Willie Dominguez    Number of Children: 2  . Years of Education: college   Occupational History  . Corporate treasurer    Social History Main Topics  . Smoking status: Never Smoker   . Smokeless tobacco: Never Used  . Alcohol Use: No  . Drug Use: No  . Sexual Activity: Not on file   Other Topics Concern  . Not on file   Social History Narrative   Married lives at home with his wife (Willie Dominguez)    self employed.   College education   Right handed   Caffeine three cokes daily                    PHYSICAL EXAM   Filed Vitals:   04/17/13 0832  BP: 136/88  Pulse: 63  Height: 6' (1.829 m)  Weight: 259 lb (117.482 kg)    Not recorded    Body mass index is 35.12 kg/(m^2).   Generalized: In no acute distress  Neck: Supple, no  carotid bruits   Cardiac: Regular rate rhythm  Pulmonary: Clear to auscultation bilaterally  Musculoskeletal: No deformity  Neurological examination  Mentation: Alert oriented to time, place, history taking, and causual conversation  Cranial nerve II-XII: Left pupil was round reactive to light. Right eye was blind since childhood. Extraocular movements were full.  Visual field were full on confrontational test. Bilateral fundi were sharp.  Facial sensation and strength were normal. Hearing was intact to finger rubbing bilaterally. Uvula tongue midline.  Head turning and shoulder shrug and were normal and symmetric.Tongue protrusion into cheek strength was normal.  Motor: Normal tone, bulk and strength.  Sensory: length dependent decreased light touch and pinprick in the midfoot level, preserved vibratory sensation lasting for 3-4 seconds, preserved proprioception  .  Coordination: Normal finger to nose, heel-to-shin bilaterally there was no truncal ataxia  Gait: Rising up from seated position without assistance, normal stance, without trunk ataxia, moderate stride, good arm swing, smooth turning, able to perform tiptoe, and heel walking without difficulty.   Romberg signs: Negative  Deep tendon reflexes: Brachioradialis 2/2, biceps 2/2, triceps 2/2, patellar 2/2, Achilles 2/2, plantar responses were flexor bilaterally.   DIAGNOSTIC DATA (LABS, IMAGING, TESTING) - I reviewed patient records, labs, notes, testing and imaging myself where available.  Lab Results  Component Value Date   WBC 4.7 09/14/2007   HGB 13.9 09/14/2007   HCT 40.1 09/14/2007   MCV 90.8 09/14/2007   PLT 139* 09/14/2007      Component Value Date/Time   NA 140 09/08/2011 0828   K 4.9 09/08/2011 0828   CL 104 09/08/2011 0828   CO2 28 09/08/2011 0828   GLUCOSE 105* 09/08/2011 0828   GLUCOSE 97 12/10/2005 1120   BUN 19 09/08/2011 0828   CREATININE 1.1 09/08/2011 0828   CALCIUM 9.1 09/08/2011 0828   PROT 7.1 09/14/2007 0909    ALBUMIN 3.9 09/14/2007 0909   AST 26 09/14/2007 0909   ALT 26 09/14/2007 0909   ALKPHOS 82 09/14/2007 0909   BILITOT 1.0 09/14/2007 0909   GFRNONAA 74 09/14/2007 0909   GFRAA 89 09/14/2007 0909   Lab Results  Component Value Date   CHOL 174 09/08/2011   HDL 42.10 09/08/2011   LDLCALC 116* 09/08/2011   TRIG 79.0 09/08/2011   CHOLHDL 4 09/08/2011   No results found for this basename: HGBA1C   No results found for this basename: VITAMINB12   Lab Results  Component Value Date   TSH 2.88  09/08/2011    ASSESSMENT AND PLAN  Willie Dominguez is a 63 y.o. male   presenting with 1 and half years history of gradual onset bilateral toes numbness, there is mild length dependent sensory changes, with reported a history of vitamin B12 deficiency,  1.  most consistent with peripheral neuropathy, likely due to B12 deficiency, will also laboratory evaluation to look for possible other etiology 2. EMG nerve conduction study       Willie Dominguez, M.D. Ph.D.  [facility] [address] [place], [place] [address] [phone]

## 2013-04-18 LAB — SPECIMEN STATUS REPORT

## 2013-04-20 LAB — HGB A1C W/O EAG: Hgb A1c MFr Bld: 5.7 % — ABNORMAL HIGH (ref 4.8–5.6)

## 2013-04-20 LAB — COPPER, SERUM: COPPER: 109 ug/dL (ref 72–166)

## 2013-04-20 LAB — IFE AND PE, SERUM
ALPHA2 GLOB SERPL ELPH-MCNC: 0.7 g/dL (ref 0.4–1.2)
Albumin SerPl Elph-Mcnc: 3.8 g/dL (ref 3.2–5.6)
Albumin/Glob SerPl: 1.4 (ref 0.7–2.0)
Alpha 1: 0.2 g/dL (ref 0.1–0.4)
B-Globulin SerPl Elph-Mcnc: 1 g/dL (ref 0.6–1.3)
GAMMA GLOB SERPL ELPH-MCNC: 1 g/dL (ref 0.5–1.6)
GLOBULIN, TOTAL: 2.8 g/dL (ref 2.0–4.5)
IGM (IMMUNOGLOBULIN M), SRM: 182 mg/dL (ref 40–230)
IgA/Immunoglobulin A, Serum: 217 mg/dL (ref 91–414)
IgG (Immunoglobin G), Serum: 1002 mg/dL (ref 700–1600)
Total Protein: 6.6 g/dL (ref 6.0–8.5)

## 2013-04-20 LAB — RPR: RPR: NONREACTIVE

## 2013-04-20 LAB — C-REACTIVE PROTEIN: CRP: 1.6 mg/L (ref 0.0–4.9)

## 2013-04-20 LAB — VITAMIN B12: VITAMIN B 12: 317 pg/mL (ref 211–946)

## 2013-04-20 LAB — THYROID PANEL WITH TSH
FREE THYROXINE INDEX: 1.6 (ref 1.2–4.9)
T3 Uptake Ratio: 25 % (ref 24–39)
T4, Total: 6.2 ug/dL (ref 4.5–12.0)
TSH: 2.94 u[IU]/mL (ref 0.450–4.500)

## 2013-04-20 LAB — METHYLMALONIC ACID, SERUM: METHYLMALONIC ACID: 151 nmol/L (ref 0–378)

## 2013-04-20 LAB — VITAMIN B1

## 2013-04-20 LAB — HOMOCYSTEINE: Homocysteine: 13.1 umol/L (ref 0.0–15.0)

## 2013-04-28 NOTE — Progress Notes (Signed)
Quick Note:  Shared with patient normal lab results per Dr Rhea Belton Findings, he verbalized understanding ______

## 2013-05-02 ENCOUNTER — Ambulatory Visit (INDEPENDENT_AMBULATORY_CARE_PROVIDER_SITE_OTHER): Payer: 59 | Admitting: Neurology

## 2013-05-02 ENCOUNTER — Encounter (INDEPENDENT_AMBULATORY_CARE_PROVIDER_SITE_OTHER): Payer: Self-pay

## 2013-05-02 DIAGNOSIS — R202 Paresthesia of skin: Secondary | ICD-10-CM

## 2013-05-02 DIAGNOSIS — G609 Hereditary and idiopathic neuropathy, unspecified: Secondary | ICD-10-CM

## 2013-05-02 DIAGNOSIS — R209 Unspecified disturbances of skin sensation: Secondary | ICD-10-CM

## 2013-05-02 DIAGNOSIS — Z0289 Encounter for other administrative examinations: Secondary | ICD-10-CM

## 2013-05-05 ENCOUNTER — Encounter: Payer: Self-pay | Admitting: Neurology

## 2013-05-05 ENCOUNTER — Ambulatory Visit (INDEPENDENT_AMBULATORY_CARE_PROVIDER_SITE_OTHER): Payer: 59 | Admitting: Neurology

## 2013-05-05 VITALS — BP 120/71 | HR 65 | Ht 73.0 in | Wt 230.0 lb

## 2013-05-05 DIAGNOSIS — R202 Paresthesia of skin: Secondary | ICD-10-CM

## 2013-05-05 DIAGNOSIS — R209 Unspecified disturbances of skin sensation: Secondary | ICD-10-CM

## 2013-05-05 NOTE — Progress Notes (Signed)
  Patient was in right lateral recombinant position. Sterile technique. 1% lidocaine with epinephrine was used for local anesthesia. Punctuated skin biopsy was performed. 3 mm skin sample were obtained at at left foot, above left extensor digitorum brevis, and left lateral calf, 10 cm above lateral malleolus. Left lateral thigh, 20 cm below left superior iliac spine  Patient tolerated the procedure well.  The wound was covered with neosporin antibiotic cream and bandage.

## 2013-05-08 NOTE — Procedures (Signed)
   NCS (NERVE CONDUCTION STUDY) WITH EMG (ELECTROMYOGRAPHY) REPORT   STUDY DATE: March 9th 2015 PATIENT NAME: Willie Dominguez DOB: 1950/05/14 MRN: [id number]    TECHNOLOGIST: Laretta Alstrom  ELECTROMYOGRAPHER: Marcial Pacas M.D.  CLINICAL INFORMATION:  63 years old male, presenting with bilateral feet paresthesia for 18 months  FINDINGS: NERVE CONDUCTION STUDY: Bilateral peroneal sensory responses  were normal.  Bilateral peroneal to EDB, and tibial motor responses were normal. Bilateral tibial H. reflexes were normal and symmetric    NEEDLE ELECTROMYOGRAPHY: Selected needle examination was performed at right lower extremity muscles, right lumbosacral paraspinal muscles.   Needle examination of right tibialis anterior, tibialis posterior, peroneal longus, medial gastrocnemius, vastus lateralis was normal  There was no spontaneous activity at right lumbosacral paraspinal muscles, right L4, L5, S1  IMPRESSION:   This is a normal study. There is no electrodiagnostic evidence of large fiber peripheral neuropathy, or right lumbosacral radiculopathy    INTERPRETING PHYSICIAN:   Marcial Pacas M.D. Ph.D. [facility] [address], [place] [employer], [place] [address] [phone]

## 2013-07-26 ENCOUNTER — Telehealth: Payer: Self-pay | Admitting: Neurology

## 2013-07-26 NOTE — Telephone Encounter (Signed)
Patient calling to get Biopsy results.

## 2013-07-27 NOTE — Telephone Encounter (Signed)
Called and spoke with patient and he said that someone had called him with the results but was not available at the time, did not see any results from [facility] in [place]. Called  with Janett Billow at [facility] and asked to fax results to our office, stated that she would fax right over. Waiting for fax report. Biopsy results in and will give to Dr Krista Blue to review. Contacted patient to inform.

## 2013-07-27 NOTE — Telephone Encounter (Signed)
I have called Mr. Romney, with out reach him, Butch Penny please call him, skin biopsy is most consistent with small fiber neuropathy.

## 2013-08-03 NOTE — Telephone Encounter (Signed)
I have called him again, explained to the skin biopsy result, there was evidence of length dependent small fiber  neuropathy, most likely related to his abnormal glucose, A1c 5.7,  I have suggested contacting his primary care physician, for better glucose control, also diet, losing weight, exercise, callback office for worsening symptoms,

## 2013-08-03 NOTE — Telephone Encounter (Signed)
Spoke to patient and relayed skin biopsy most consistent with small fiber neuropathy, per Dr.Yan.    The patient would like the doctor to call him for better understanding, also about his side effects with the Gabapentin, and also has some concerns about possible MS dx.  Please call home number [phone].

## 2013-08-07 ENCOUNTER — Encounter: Payer: Self-pay | Admitting: Neurology

## 2013-08-14 ENCOUNTER — Other Ambulatory Visit: Payer: Self-pay | Admitting: Internal Medicine

## 2013-11-17 ENCOUNTER — Telehealth: Payer: Self-pay | Admitting: Internal Medicine

## 2013-11-17 NOTE — Telephone Encounter (Signed)
Pt's wife is aware. She will talk to pt and see if he wants to schedule back with Dr. Raliegh Ip

## 2013-11-17 NOTE — Telephone Encounter (Signed)
Sorry but I am too full to take him, thanks

## 2013-11-17 NOTE — Telephone Encounter (Signed)
ok 

## 2013-11-17 NOTE — Telephone Encounter (Signed)
Pt would like to transfer from Dr. Raliegh Ip to Dr. Sarajane Jews.  Please advise if okay.  Pt's spouse states pt does not want to see Dr. Raliegh Ip anymore.

## 2013-11-20 ENCOUNTER — Encounter: Payer: Self-pay | Admitting: Internal Medicine

## 2013-11-20 ENCOUNTER — Ambulatory Visit (INDEPENDENT_AMBULATORY_CARE_PROVIDER_SITE_OTHER): Payer: 59 | Admitting: Internal Medicine

## 2013-11-20 ENCOUNTER — Telehealth: Payer: Self-pay | Admitting: Internal Medicine

## 2013-11-20 VITALS — BP 130/90 | HR 59 | Temp 97.7°F | Resp 20 | Ht 73.0 in | Wt 240.0 lb

## 2013-11-20 DIAGNOSIS — G609 Hereditary and idiopathic neuropathy, unspecified: Secondary | ICD-10-CM

## 2013-11-20 DIAGNOSIS — Z23 Encounter for immunization: Secondary | ICD-10-CM

## 2013-11-20 DIAGNOSIS — E785 Hyperlipidemia, unspecified: Secondary | ICD-10-CM

## 2013-11-20 DIAGNOSIS — I10 Essential (primary) hypertension: Secondary | ICD-10-CM

## 2013-11-20 LAB — HEMOGLOBIN A1C: Hgb A1c MFr Bld: 5.4 % (ref 4.6–6.5)

## 2013-11-20 MED ORDER — LOSARTAN POTASSIUM 100 MG PO TABS: 100.0000 mg | ORAL_TABLET | Freq: Every day | ORAL | Status: DC

## 2013-11-20 NOTE — Patient Instructions (Addendum)
It is important that you exercise regularly, at least 20 minutes 3 to 4 times per week.  If you develop chest pain or shortness of breath seek  medical attention.  You need to lose weight.  Consider a lower calorie diet and regular exercise.  Return in 6 months for follow-up Diabetes and Exercise Exercising regularly is important. It is not just about losing weight. It has many health benefits, such as:  Improving your overall fitness, flexibility, and endurance.  Increasing your bone density.  Helping with weight control.  Decreasing your body fat.  Increasing your muscle strength.  Reducing stress and tension.  Improving your overall health. People with diabetes who exercise gain additional benefits because exercise:  Reduces appetite.  Improves the body's use of blood sugar (glucose).  Helps lower or control blood glucose.  Decreases blood pressure.  Helps control blood lipids (such as cholesterol and triglycerides).  Improves the body's use of the hormone insulin by:  Increasing the body's insulin sensitivity.  Reducing the body's insulin needs.  Decreases the risk for heart disease because exercising:  Lowers cholesterol and triglycerides levels.  Increases the levels of good cholesterol (such as high-density lipoproteins [HDL]) in the body.  Lowers blood glucose levels. YOUR ACTIVITY PLAN  Choose an activity that you enjoy and set realistic goals. Your health care provider or diabetes educator can help you make an activity plan that works for you. Exercise regularly as directed by your health care provider. This includes:  Performing resistance training twice a week such as push-ups, sit-ups, lifting weights, or using resistance bands.  Performing 150 minutes of cardio exercises each week such as walking, running, or playing sports.  Staying active and spending no more than 90 minutes at one time being inactive. Even short bursts of exercise are good for  you. Three 10-minute sessions spread throughout the day are just as beneficial as a single 30-minute session. Some exercise ideas include:  Taking the dog for a walk.  Taking the stairs instead of the elevator.  Dancing to your favorite song.  Doing an exercise video.  Doing your favorite exercise with a friend. RECOMMENDATIONS FOR EXERCISING WITH TYPE 1 OR TYPE 2 DIABETES   Check your blood glucose before exercising. If blood glucose levels are greater than 240 mg/dL, check for urine ketones. Do not exercise if ketones are present.  Avoid injecting insulin into areas of the body that are going to be exercised. For example, avoid injecting insulin into:  The arms when playing tennis.  The legs when jogging.  Keep a record of:  Food intake before and after you exercise.  Expected peak times of insulin action.  Blood glucose levels before and after you exercise.  The type and amount of exercise you have done.  Review your records with your health care provider. Your health care provider will help you to develop guidelines for adjusting food intake and insulin amounts before and after exercising.  If you take insulin or oral hypoglycemic agents, watch for signs and symptoms of hypoglycemia. They include:  Dizziness.  Shaking.  Sweating.  Chills.  Confusion.  Drink plenty of water while you exercise to prevent dehydration or heat stroke. Body water is lost during exercise and must be replaced.  Talk to your health care provider before starting an exercise program to make sure it is safe for you. Remember, almost any type of activity is better than none. Document Released: 05/09/2003 Document Revised: 07/03/2013 Document Reviewed: 07/26/2012 [employer] Patient Information  2015 [employer]. This information is not intended to replace advice given to you by your health care provider. Make sure you discuss any questions you have with your health care provider. Diabetes  [place] and Food It is important for you to manage your blood sugar (glucose) level. Your blood glucose level can be greatly affected by what you eat. Eating healthier foods in the appropriate amounts throughout the day at about the same time each day will help you control your blood glucose level. It can also help slow or prevent worsening of your diabetes mellitus. Healthy eating may even help you improve the level of your blood pressure and reach or maintain a healthy weight.  HOW CAN FOOD AFFECT ME? Carbohydrates Carbohydrates affect your blood glucose level more than any other type of food. Your dietitian will help you determine how many carbohydrates to eat at each meal and teach you how to count carbohydrates. Counting carbohydrates is important to keep your blood glucose at a healthy level, especially if you are using insulin or taking certain medicines for diabetes mellitus. Alcohol Alcohol can cause sudden decreases in blood glucose (hypoglycemia), especially if you use insulin or take certain medicines for diabetes mellitus. Hypoglycemia can be a life-threatening condition. Symptoms of hypoglycemia (sleepiness, dizziness, and disorientation) are similar to symptoms of having too much alcohol.  If your health care provider has given you approval to drink alcohol, do so in moderation and use the following guidelines:  Women should not have more than one drink per day, and men should not have more than two drinks per day. One drink is equal to:  12 oz of beer.  5 oz of wine.  1 oz of hard liquor.  Do not drink on an empty stomach.  Keep yourself hydrated. Have water, diet soda, or unsweetened iced tea.  Regular soda, juice, and other mixers might contain a lot of carbohydrates and should be counted. WHAT FOODS ARE NOT RECOMMENDED? As you make food choices, it is important to remember that all foods are not the same. Some foods have fewer nutrients per serving than other foods, even  though they might have the same number of calories or carbohydrates. It is difficult to get your body what it needs when you eat foods with fewer nutrients. Examples of foods that you should avoid that are high in calories and carbohydrates but low in nutrients include:  Trans fats (most processed foods list trans fats on the Nutrition Facts label).  Regular soda.  Juice.  Candy.  Sweets, such as cake, pie, doughnuts, and cookies.  Fried foods. WHAT FOODS CAN I EAT? Have nutrient-rich foods, which will nourish your body and keep you healthy. The food you should eat also will depend on several factors, including:  The calories you need.  The medicines you take.  Your weight.  Your blood glucose level.  Your blood pressure level.  Your cholesterol level. You also should eat a variety of foods, including:  Protein, such as meat, poultry, fish, tofu, nuts, and seeds (lean animal proteins are best).  Fruits.  Vegetables.  Dairy products, such as milk, cheese, and yogurt (low fat is best).  Breads, grains, pasta, cereal, rice, and beans.  Fats such as olive oil, trans fat-free margarine, canola oil, avocado, and olives. DOES EVERYONE WITH DIABETES MELLITUS HAVE THE SAME MEAL PLAN? Because every person with diabetes mellitus is different, there is not one meal plan that works for everyone. It is very important that you meet  with a dietitian who will help you create a meal plan that is just right for you. Document Released: 11/13/2004 Document Revised: 02/21/2013 Document Reviewed: 01/13/2013 [facility] Patient Information 2015 [place], [place]. This information is not intended to replace advice given to you by your health care provider. Make sure you discuss any questions you have with your health care provider. Diabetes, Eating Away From Home Sometimes, you might eat in a restaurant or have meals that are prepared by someone else. You can enjoy eating out. However, the portions in  restaurants may be much larger than needed. Listed below are some ideas to help you choose foods that will keep your blood glucose (sugar) in better control.  TIPS FOR EATING OUT  Know your meal plan and how many carbohydrate servings you should have at each meal. You may wish to carry a copy of your meal plan in your purse or wallet. Learn the foods included in each food group.  Make a list of restaurants near you that offer healthy choices. Take a copy of the carry-out menus to see what they offer. Then, you can plan what you will order ahead of time.  Become familiar with serving sizes by practicing them at home using measuring cups and spoons. Once you learn to recognize portion sizes, you will be able to correctly estimate the amount of total carbohydrate you are allowed to eat at the restaurant. Ask for a takeout box if the portion is more than you should have. When your food comes, leave the amount you should have on the plate, and put the rest in the takeout box before you start eating.  Plan ahead if your mealtime will be different from usual. Check with your caregiver to find out how to time meals and medicine if you are taking insulin.  Avoid high-fat foods, such as fried foods, cream sauces, high-fat salad dressings, or any added butter or margarine.  Do not be afraid to ask questions. Ask your server about the portion size, cooking methods, ingredients and if items can be substituted. Restaurants do not list all available items on the menu. You can ask for your main entree to be prepared using skim milk, oil instead of butter or margarine, and without gravy or sauces. Ask your waiter or waitress to serve salad dressings, gravy, sauces, margarine, and sour cream on the side. You can then add the amount your meal plan suggests.  Add more vegetables whenever possible.  Avoid items that are labeled "jumbo," "giant," "deluxe," or "supersized."  You may want to split an entre with someone  and order an extra side salad.  Watch for hidden calories in foods like croutons, bacon, or cheese.  Ask your server to take away the bread basket or chips from your table.  Order a dinner salad as an appetizer. You can eat most foods served in a restaurant. Some foods are better choices than others. Breads and Starches  Recommended: All kinds of bread (wheat, rye, white, oatmeal, [place], [place], raisin), hard or soft dinner rolls, frankfurter or hamburger buns, small bagels, small corn or whole-wheat flour tortillas.  Avoid: Frosted or glazed breads, butter rolls, egg or cheese breads, croissants, sweet rolls, pastries, coffee cake, glazed or frosted doughnuts, muffins. Crackers  Recommended: Animal crackers, graham, rye, saltine, oyster, and matzoth crackers. Bread sticks, melba toast, rusks, pretzels, popcorn (without fat), zwieback toast.  Avoid: High-fat snack crackers or chips. Buttered popcorn. Cereals  Recommended: Hot and cold cereals. Whole grains such as oatmeal  or shredded wheat are good choices.  Avoid: Sugar-coated or granola type cereals. Potatoes/Pasta/Rice/Beans  Recommended: Order baked, boiled, or mashed potatoes, rice or noodles without added fat, whole beans. Order gravies, butter, margarine, or sauces on the side so you can control the amount you add.  Avoid: Hash browns or fried potatoes. Potatoes, pasta, or rice prepared with cream or cheese sauce. Potato or pasta salads prepared with large amounts of dressing. Fried beans or fried rice. Vegetables  Recommended: Order steamed, baked, boiled, or stewed vegetables without sauces or extra fat. Ask that sauce be served on the side. If vegetables are not listed on the menu, ask what is available.  Avoid: Vegetables prepared with cream, butter, or cheese sauce. Fried vegetables. Salad Bars  Recommended: Many of the vegetables at a salad bar are considered "free." Use lemon juice, vinegar, or low-calorie salad  dressing (fewer than 20 calories per serving) as "free" dressings for your salad. Look for salad bar ingredients that have no added fat or sugar such as tomatoes, lettuce, cucumbers, broccoli, carrots, onions, and mushrooms.  Avoid: Prepared salads with large amounts of dressing, such as coleslaw, caesar salad, macaroni salad, bean salad, or carrot salad. Fruit  Recommended: Eat fresh fruit or fresh fruit salad without added dressing. A salad bar often offers fresh fruit choices, but canned fruit at a restaurant is usually packed in sugar or syrup.  Avoid: Sweetened canned or frozen fruits, plain or sweetened fruit juice. Fruit salads with dressing, sour cream, or sugar added to them. Meat and Meat Substitutes  Recommended: Order broiled, baked, roasted, or grilled meat, poultry, or fish. Trim off all visible fat. Do not eat the skin of poultry. The size stated on the menu is the raw weight. Meat shrinks by  in cooking (for example, 4 oz raw equals 3 oz cooked meat).  Avoid: Deep-fat fried meat, poultry, or fish. Breaded meats. Eggs  Recommended: Order soft, hard-cooked, poached, or scrambled eggs. Omelets may be okay, depending on what ingredients are added. Egg substitutes are also a good choice.  Avoid: Fried eggs, eggs prepared with cream or cheese sauce. Milk  Recommended: Order low-fat or fat-free milk according to your meal plan. Plain, nonfat yogurt or flavored yogurt with no sugar added may be used as a substitute for milk. Soy milk may also be used.  Avoid: Milk shakes or sweetened milk beverages. Soups and Combination Foods  Recommended: Clear broth or consomm are "free" foods and may be used as an appetizer. Broth-based soups with fat removed count as a starch serving and are preferred over cream soups. Soups made with beans or split peas may be eaten but count as a starch.  Avoid: Fatty soups, soup made with cream, cheese soup. Combination foods prepared with excessive  amounts of fat or with cream or cheese sauces. Desserts and Sweets  Recommended: Ask for fresh fruit. Sponge or angel food cake without icing, ice milk, no sugar added ice cream, sherbet, or frozen yogurt may fit into your meal plan occasionally.  Avoid: Pastries, puddings, pies, cakes with icing, custard, gelatin desserts. Fats and Oils  Recommended: Choose healthy fats such as olive oil, canola oil, or tub margarine, reduced fat or fat-free sour cream, cream cheese, avocado, or nuts.  Avoid: Any fats in excess of your allowed portion. Deep-fried foods or any food with a large amount of fat. Note: Ask for all fats to be served on the side, and limit your portion sizes according to your meal plan.  Document Released: 02/16/2005 Document Revised: 05/11/2011 Document Reviewed: 05/16/2013 [facility] Patient Information 2015 [place], [place]. This information is not intended to replace advice given to you by your health care provider. Make sure you discuss any questions you have with your health care provider.

## 2013-11-20 NOTE — Telephone Encounter (Signed)
Spoke to pt, told him need to continue Losartan 100 mg and watch diet and exercise. Will recheck blood sugar levels at next visit. Rx sent to pharmacy. Pt verbalized understanding.

## 2013-11-20 NOTE — Progress Notes (Signed)
Pre visit review using our clinic review tool, if applicable. No additional management support is needed unless otherwise documented below in the visit note. 

## 2013-11-20 NOTE — Telephone Encounter (Signed)
Pt needs clarification on which meds he is to continue. Pt thought he is oply to take one BP med and thought dr Raliegh Ip was going to give rx blood sugars well [facility] [address] Pt states his AVS is a little misleading,

## 2013-11-20 NOTE — Progress Notes (Signed)
Subjective:    Patient ID: Willie Dominguez, male    DOB: [birth year], 63 y.o.   MRN: [id number]  HPI 63 year old patient who is seen today in followup.  He has treated hypertension, which has been stable.  Remains on losartan. He has a history of a small vessel peripheral neuropathy, which continues to worsen.  He has been on gabapentin, but self discontinued after a dose increase, side effects. He has a history of mild dyslipidemia. Hemoglobin A1c was minimally elevated at 5 point 7.  He has had some occasional blood sugars greater than 100  Past Medical History  Diagnosis Date  . Unspecified essential hypertension   . Personal history of urinary calculi   . 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 Thrombocytopenia   . Other and unspecified hyperlipidemia      with elevated lipoprotein (a)  . History of BPH   . Arthritis   . H/O benign prostatic hypertrophy     mild  . Unspecified adverse effect of unspecified drug, medicinal and biological substance   . Retention of urine, unspecified   . Urticaria, unspecified   . High blood pressure   . Numbness of toes     History   Social History  . Marital Status: Married    Spouse Name: [name]    Number of Children: 2  . Years of Education: college   Occupational History  . Corporate treasurer    Social History Main Topics  . Smoking status: Never Smoker   . Smokeless tobacco: Never Used  . Alcohol Use: No  . Drug Use: No  . Sexual Activity: Not on file   Other Topics Concern  . Not on file   Social History Narrative   Married lives at home with his wife (carrie)    self employed.   College education   Right handed   Caffeine three cokes daily                   Past Surgical History  Procedure Laterality Date  . Laparotomy    . Inguinal herniorrhaphy  age 49    . Colonoscopy november 2007      Family History  Problem Relation Age of Onset  . COPD Mother   . Coronary artery disease  Maternal Aunt   . Diabetes Father   . Other Father     CABG  . Lung cancer Mother   . Heart Problems Father     Allergies  Allergen Reactions  . Methotrexate Derivatives Other (See Comments)    Mouth ulcers  . Bystolic [Nebivolol Hcl]   . Doxazosin     REACTION: aggrivated prostate, weakend stream, bladder pain and irritation  . Lisinopril     REACTION: Intolerance.  Depressive mood, fatigue  . Penicillins     REACTION: rash  . Simvastatin     REACTION: rash  . Statins     Breaks out in full body rash with itching and heat    Current Outpatient Prescriptions on File Prior to Visit  Medication Sig Dispense Refill  . aspirin 81 MG tablet Take 81 mg by mouth daily.        . Cholecalciferol (VITAMIN D3) 3000 UNITS TABS Take by mouth 2 (two) times daily.      . Coenzyme Q10 (COQ-10) 100 MG CAPS 1 tab po qd       . Cyanocobalamin (B-12 PO) Take by mouth daily.      [name]  gabapentin (NEURONTIN) 300 MG capsule Take 300 mg by mouth 3 (three) times daily.      [name] losartan (COZAAR) 100 MG tablet Take 100 mg by mouth daily.      . Omega-3 Fatty Acids (FISH OIL) 1000 MG CAPS 1 tab po qd       . lisinopril-hydrochlorothiazide (PRINZIDE,ZESTORETIC) 20-12.5 MG per tablet Take 1 tablet by mouth daily.  30 tablet  11   No current facility-administered medications on file prior to visit.    BP 130/90  Pulse 59  Temp(Src) 97.7 F (36.5 C) (Oral)  Resp 20  Ht 6\' 1"  (1.854 m)  Wt 240 lb (108.863 kg)  BMI 31.67 kg/m2  SpO2 96%      Review of Systems  Constitutional: Negative for fever, chills, appetite change and fatigue.  HENT: Negative for congestion, dental problem, ear pain, hearing loss, sore throat, tinnitus, trouble swallowing and voice change.   Eyes: Negative for pain, discharge and visual disturbance.  Respiratory: Negative for cough, chest tightness, wheezing and stridor.   Cardiovascular: Negative for chest pain, palpitations and leg swelling.  Gastrointestinal: Negative  for nausea, vomiting, abdominal pain, diarrhea, constipation, blood in stool and abdominal distention.  Genitourinary: Negative for urgency, hematuria, flank pain, discharge, difficulty urinating and genital sores.  Musculoskeletal: Negative for arthralgias, back pain, gait problem, joint swelling, myalgias and neck stiffness.  Skin: Negative for rash.  Neurological: Positive for numbness. Negative for dizziness, syncope, speech difficulty, weakness and headaches.  Hematological: Negative for adenopathy. Does not bruise/bleed easily.  Psychiatric/Behavioral: Negative for behavioral problems and dysphoric mood. The patient is not nervous/anxious.        Objective:   Physical Exam  Vitals reviewed. Constitutional: He is oriented to person, place, and time. He appears well-developed.  HENT:  Head: Normocephalic.  Right Ear: External ear normal.  Left Ear: External ear normal.  Eyes: Conjunctivae and EOM are normal.  Neck: Normal range of motion.  Cardiovascular: Normal rate and normal heart sounds.   Pulmonary/Chest: Breath sounds normal.  Abdominal: Bowel sounds are normal.  Musculoskeletal: Normal range of motion. He exhibits no edema and no tenderness.  Neurological: He is alert and oriented to person, place, and time.  Psychiatric: He has a normal mood and affect. His behavior is normal.          Assessment & Plan:   Hypertension.  Well controlled.  Repeat blood pressure 130/80 Small vessel peripheral neuropathy.  We'll recheck a hemoglobin A1c.  We'll place on a diet Obesity.  Lean  body weight in the past has been 180.  Presently 240

## 2013-11-23 ENCOUNTER — Ambulatory Visit: Payer: 59 | Admitting: Cardiology

## 2014-01-17 ENCOUNTER — Ambulatory Visit (INDEPENDENT_AMBULATORY_CARE_PROVIDER_SITE_OTHER): Payer: 59 | Admitting: Family Medicine

## 2014-01-17 ENCOUNTER — Ambulatory Visit (INDEPENDENT_AMBULATORY_CARE_PROVIDER_SITE_OTHER)
Admission: RE | Admit: 2014-01-17 | Discharge: 2014-01-17 | Disposition: A | Discharge status: Home / Self Care | Payer: 59 | Source: Ambulatory Visit | Attending: Family Medicine | Admitting: Family Medicine

## 2014-01-17 ENCOUNTER — Encounter: Payer: Self-pay | Admitting: Family Medicine

## 2014-01-17 VITALS — BP 132/80 | HR 63 | Temp 97.8°F | Wt 243.0 lb

## 2014-01-17 DIAGNOSIS — R059 Cough, unspecified: Secondary | ICD-10-CM

## 2014-01-17 DIAGNOSIS — R05 Cough: Secondary | ICD-10-CM

## 2014-01-17 DIAGNOSIS — R509 Fever, unspecified: Secondary | ICD-10-CM

## 2014-01-17 LAB — CBC WITH DIFFERENTIAL/PLATELET
BASOS ABS: 0 10*3/uL (ref 0.0–0.1)
BASOS PCT: 0.4 % (ref 0.0–3.0)
EOS ABS: 0.3 10*3/uL (ref 0.0–0.7)
Eosinophils Relative: 5.1 % — ABNORMAL HIGH (ref 0.0–5.0)
HEMATOCRIT: 40.8 % (ref 39.0–52.0)
HEMOGLOBIN: 13.6 g/dL (ref 13.0–17.0)
LYMPHS ABS: 1.7 10*3/uL (ref 0.7–4.0)
Lymphocytes Relative: 27.3 % (ref 12.0–46.0)
MCHC: 33.3 g/dL (ref 30.0–36.0)
MCV: 89.9 fl (ref 78.0–100.0)
Monocytes Absolute: 0.5 10*3/uL (ref 0.1–1.0)
Monocytes Relative: 8 % (ref 3.0–12.0)
Neutro Abs: 3.6 10*3/uL (ref 1.4–7.7)
Neutrophils Relative %: 59.2 % (ref 43.0–77.0)
Platelets: 182 10*3/uL (ref 150.0–400.0)
RBC: 4.53 Mil/uL (ref 4.22–5.81)
RDW: 14 % (ref 11.5–15.5)
WBC: 6.1 10*3/uL (ref 4.0–10.5)

## 2014-01-17 MED ORDER — LEVOFLOXACIN 500 MG PO TABS: 500.0000 mg | ORAL_TABLET | Freq: Every day | ORAL | Status: DC

## 2014-01-17 NOTE — Patient Instructions (Signed)

## 2014-01-17 NOTE — Progress Notes (Signed)
Pre visit review using our clinic review tool, if applicable. No additional management support is needed unless otherwise documented below in the visit note. 

## 2014-01-17 NOTE — Progress Notes (Signed)
   Subjective:    Patient ID: Willie Dominguez, male    DOB: 1950-03-29, 63 y.o.   MRN: [id number]  HPI Patient seen with almost 4 week history of cough. Initially nonproductive and for the most part has stayed nonproductive. He has if anything done worse over the past several days. Possible low-grade fever, 99 range. He's had some mild shortness of breath and increased fatigue. Started off with body aches and those have resolved but his fatigue has gotten worse. Denies any nausea or vomiting. Intermittent headaches. Never smoked. No history of asthma.  Cough has been fairly severe at times. Not relieved with over-the-counter medications.history of multiple drug allergies including penicillin and these were reviewed.chronic problems are reviewed and include history of kidney stones, hypertension, hyperlipidemia, BPH  Past Medical History  Diagnosis Date  . Unspecified essential hypertension   . Personal history of urinary calculi   . 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 Thrombocytopenia   . Other and unspecified hyperlipidemia      with elevated lipoprotein (a)  . History of BPH   . Arthritis   . H/O benign prostatic hypertrophy     mild  . Unspecified adverse effect of unspecified drug, medicinal and biological substance   . Retention of urine, unspecified   . Urticaria, unspecified   . High blood pressure   . Numbness of toes    Past Surgical History  Procedure Laterality Date  . Laparotomy    . Inguinal herniorrhaphy  age 55    . Colonoscopy november 2007      reports that he has never smoked. He has never used smokeless tobacco. He reports that he does not drink alcohol or use illicit drugs. family history includes COPD in his mother; Coronary artery disease in his maternal aunt; Diabetes in his father; Heart Problems in his father; Lung cancer in his mother; Other in his father. Allergies  Allergen Reactions  . Methotrexate Derivatives Other (See Comments)      Mouth ulcers  . Bystolic [Nebivolol Hcl]   . Doxazosin     REACTION: aggrivated prostate, weakend stream, bladder pain and irritation  . Lisinopril     REACTION: Intolerance.  Depressive mood, fatigue  . Penicillins     REACTION: rash  . Simvastatin     REACTION: rash  . Statins     Breaks out in full body rash with itching and heat      Review of Systems  Constitutional: Positive for fever and fatigue. Negative for chills.  HENT: Negative for congestion.   Respiratory: Positive for cough and shortness of breath.   Cardiovascular: Negative for chest pain, palpitations and leg swelling.  Neurological: Negative for dizziness.       Objective:   Physical Exam  Constitutional: He appears well-developed and well-nourished.  HENT:  Mouth/Throat: Oropharynx is clear and moist.  Neck: Neck supple.  Cardiovascular: Normal rate and regular rhythm.   Pulmonary/Chest: Effort normal and breath sounds normal. He has no wheezes.  Slightly diminished breath sounds right base compared to left. Question some faint rales right base  Musculoskeletal: He exhibits no edema.  Neurological: He is alert.          Assessment & Plan:  Persistent cough. With recent question of low-grade fever, concern is possible developing pneumonia, possibly right lower lobe. Check CBC. Chest x-ray. Consider Levaquin 500 milligrams once daily for 10 days.

## 2015-08-20 ENCOUNTER — Other Ambulatory Visit (INDEPENDENT_AMBULATORY_CARE_PROVIDER_SITE_OTHER): Payer: 59

## 2015-08-20 DIAGNOSIS — Z0000 Encounter for general adult medical examination without abnormal findings: Secondary | ICD-10-CM

## 2015-08-20 LAB — BASIC METABOLIC PANEL
BUN: 19 mg/dL (ref 6–23)
CHLORIDE: 107 meq/L (ref 96–112)
CO2: 26 mEq/L (ref 19–32)
Calcium: 9.2 mg/dL (ref 8.4–10.5)
Creatinine, Ser: 1.14 mg/dL (ref 0.40–1.50)
GFR: 68.61 mL/min (ref >60.00)
Glucose, Bld: 103 mg/dL — ABNORMAL HIGH (ref 70–99)
POTASSIUM: 4.1 meq/L (ref 3.5–5.1)
SODIUM: 140 meq/L (ref 135–145)

## 2015-08-20 LAB — POC URINALSYSI DIPSTICK (AUTOMATED)
BILIRUBIN UA: NEGATIVE
Glucose, UA: NEGATIVE
Ketones, UA: NEGATIVE
Leukocytes, UA: NEGATIVE
NITRITE UA: NEGATIVE
PH UA: 5.5
PROTEIN UA: NEGATIVE
Spec Grav, UA: >=1.03
UROBILINOGEN UA: 1

## 2015-08-20 LAB — CBC WITH DIFFERENTIAL/PLATELET
BASOS PCT: 0.3 % (ref 0.0–3.0)
Basophils Absolute: 0 10*3/uL (ref 0.0–0.1)
EOS PCT: 3 % (ref 0.0–5.0)
Eosinophils Absolute: 0.2 10*3/uL (ref 0.0–0.7)
HCT: 41.9 % (ref 39.0–52.0)
Hemoglobin: 14 g/dL (ref 13.0–17.0)
Lymphocytes Relative: 34.4 % (ref 12.0–46.0)
Lymphs Abs: 1.9 10*3/uL (ref 0.7–4.0)
MCHC: 33.5 g/dL (ref 30.0–36.0)
MCV: 90.5 fl (ref 78.0–100.0)
MONO ABS: 0.4 10*3/uL (ref 0.1–1.0)
MONOS PCT: 7.9 % (ref 3.0–12.0)
Neutro Abs: 3.1 10*3/uL (ref 1.4–7.7)
Neutrophils Relative %: 54.4 % (ref 43.0–77.0)
Platelets: 147 10*3/uL — ABNORMAL LOW (ref 150.0–400.0)
RBC: 4.63 Mil/uL (ref 4.22–5.81)
RDW: 14 % (ref 11.5–15.5)
WBC: 5.6 10*3/uL (ref 4.0–10.5)

## 2015-08-20 LAB — HEPATIC FUNCTION PANEL
ALBUMIN: 4.1 g/dL (ref 3.5–5.2)
ALT: 27 U/L (ref 0–53)
AST: 19 U/L (ref 0–37)
Alkaline Phosphatase: 72 U/L (ref 39–117)
BILIRUBIN TOTAL: 0.7 mg/dL (ref 0.2–1.2)
Bilirubin, Direct: 0.2 mg/dL (ref 0.0–0.3)
Total Protein: 6.8 g/dL (ref 6.0–8.3)

## 2015-08-20 LAB — LIPID PANEL
CHOLESTEROL: 127 mg/dL (ref 0–200)
HDL: 51.1 mg/dL (ref >39.00)
LDL CALC: 62 mg/dL (ref 0–99)
NonHDL: 76.08
Total CHOL/HDL Ratio: 2
Triglycerides: 70 mg/dL (ref 0.0–149.0)
VLDL: 14 mg/dL (ref 0.0–40.0)

## 2015-08-20 LAB — PSA: PSA: 0.3 ng/mL (ref 0.10–4.00)

## 2015-08-20 LAB — TSH: TSH: 3.21 u[IU]/mL (ref 0.35–4.50)

## 2015-08-27 ENCOUNTER — Ambulatory Visit (INDEPENDENT_AMBULATORY_CARE_PROVIDER_SITE_OTHER)
Admission: RE | Admit: 2015-08-27 | Discharge: 2015-08-27 | Disposition: A | Discharge status: Home / Self Care | Payer: 59 | Source: Ambulatory Visit | Attending: Internal Medicine | Admitting: Internal Medicine

## 2015-08-27 ENCOUNTER — Encounter: Payer: Self-pay | Admitting: Internal Medicine

## 2015-08-27 ENCOUNTER — Ambulatory Visit (INDEPENDENT_AMBULATORY_CARE_PROVIDER_SITE_OTHER): Payer: 59 | Admitting: Internal Medicine

## 2015-08-27 VITALS — BP 122/80 | HR 57 | Temp 98.3°F | Resp 20 | Ht 71.0 in | Wt 236.0 lb

## 2015-08-27 DIAGNOSIS — Z0000 Encounter for general adult medical examination without abnormal findings: Secondary | ICD-10-CM

## 2015-08-27 DIAGNOSIS — I10 Essential (primary) hypertension: Secondary | ICD-10-CM

## 2015-08-27 DIAGNOSIS — G609 Hereditary and idiopathic neuropathy, unspecified: Secondary | ICD-10-CM | POA: Diagnosis not present

## 2015-08-27 DIAGNOSIS — R930 Abnormal findings on diagnostic imaging of skull and head, not elsewhere classified: Secondary | ICD-10-CM

## 2015-08-27 DIAGNOSIS — E785 Hyperlipidemia, unspecified: Secondary | ICD-10-CM

## 2015-08-27 MED ORDER — GABAPENTIN 100 MG PO CAPS: 100.0000 mg | ORAL_CAPSULE | Freq: Three times a day (TID) | ORAL | Status: DC

## 2015-08-27 MED ORDER — GABAPENTIN 300 MG PO CAPS: 300.0000 mg | ORAL_CAPSULE | Freq: Three times a day (TID) | ORAL | Status: DC

## 2015-08-27 NOTE — Progress Notes (Signed)
Subjective:    Patient ID: Willie Dominguez, male    DOB: [birth year], 65 y.o.   MRN: [id number]  HPI 36 -year-old patient who is seen today for a health maintenance examination.  He has been seen by cardiology In the past and did have a negative nuclear stress test performed. He has treated hypertension.  He has seen urology due to history of kidney stones. Today he has done fairly well. He does have a history of mild dyslipidemia but has been intolerant to statins In the past.  More recently has been placed on Crestor by cardiology, which he tolerates well.  Generally done well today. He did have a colonoscopy in 2007. He does have a history of psoriatic arthritis and has seen rheumatology.  His main complaint is worsening peripheral neuropathy.  He has been evaluated by neurology in the past.  He does have a history of impaired glucose tolerance.  Remains on high-dose oral B12 supplements.  He describes a neuropathy mainly as numbness.  More recently this has progressed to a burning and occasionally sharp sensation and now also involves the hands, especially the second and third fingers.  Past Medical History  Diagnosis Date  . Unspecified essential hypertension   . Personal history of urinary calculi   . 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 Thrombocytopenia ([place])   . Other and unspecified hyperlipidemia      with elevated lipoprotein (a)  . History of BPH   . Arthritis   . H/O benign prostatic hypertrophy     mild  . Unspecified adverse effect of unspecified drug, medicinal and biological substance   . Retention of urine, unspecified   . Urticaria, unspecified   . High blood pressure   . Numbness of toes     Social History   Social History  . Marital Status: Married    Spouse Name: [name]  . Number of Children: 2  . Years of Education: college   Occupational History  . Corporate treasurer    Social History Main Topics  . Smoking status: Never Smoker    . Smokeless tobacco: Never Used  . Alcohol Use: No  . Drug Use: No  . Sexual Activity: Not on file   Other Topics Concern  . Not on file   Social History Narrative   Married lives at home with his wife (carrie)    self employed.   College education   Right handed   Caffeine three cokes daily                   Past Surgical History  Procedure Laterality Date  . Laparotomy    . Inguinal herniorrhaphy  age 75    . Colonoscopy november 2007      Family History  Problem Relation Age of Onset  . COPD Mother   . Coronary artery disease Maternal Aunt   . Diabetes Father   . Other Father     CABG  . Lung cancer Mother   . Heart Problems Father     Allergies  Allergen Reactions  . Methotrexate Derivatives Other (See Comments)    Mouth ulcers  . Lipitor [Atorvastatin] Rash  . Bystolic [Nebivolol Hcl]   . Doxazosin     REACTION: aggrivated prostate, weakend stream, bladder pain and irritation  . Lisinopril     REACTION: Intolerance.  Depressive mood, fatigue  . Penicillins     REACTION: rash  . Simvastatin  REACTION: rash    Current Outpatient Prescriptions on File Prior to Visit  Medication Sig Dispense Refill  . aspirin 81 MG tablet Take 81 mg by mouth daily.      . Cyanocobalamin (B-12 PO) Take 1,000 mcg by mouth daily.      No current facility-administered medications on file prior to visit.    BP 122/80 mmHg  Pulse 57  Temp(Src) 98.3 F (36.8 C) (Oral)  Resp 20  Ht 5\' 11"  (1.803 m)  Wt 236 lb (107.049 kg)  BMI 32.93 kg/m2  SpO2 97%       Review of Systems  Constitutional: Negative for fever, chills, activity change, appetite change and fatigue.  HENT: Negative for congestion, dental problem, ear pain, hearing loss, mouth sores, rhinorrhea, sinus pressure, sneezing, tinnitus, trouble swallowing and voice change.   Eyes: Negative for photophobia, pain, redness and visual disturbance.  Respiratory: Negative for apnea, cough, choking, chest  tightness, shortness of breath and wheezing.   Cardiovascular: Negative for chest pain, palpitations and leg swelling.  Gastrointestinal: Negative for nausea, vomiting, abdominal pain, diarrhea, constipation, blood in stool, abdominal distention, anal bleeding and rectal pain.  Genitourinary: Negative for dysuria, urgency, frequency, hematuria, flank pain, decreased urine volume, discharge, penile swelling, scrotal swelling, difficulty urinating, genital sores and testicular pain.  Musculoskeletal: Negative for myalgias, back pain, joint swelling, arthralgias, gait problem, neck pain and neck stiffness.  Skin: Negative for color change, rash and wound.  Neurological: Positive for numbness. Negative for dizziness, tremors, seizures, syncope, facial asymmetry, speech difficulty, weakness, light-headedness and headaches.  Hematological: Negative for adenopathy. Does not bruise/bleed easily.  Psychiatric/Behavioral: Negative for suicidal ideas, hallucinations, behavioral problems, confusion, sleep disturbance, self-injury, dysphoric mood, decreased concentration and agitation. The patient is not nervous/anxious.        Objective:   Physical Exam  Constitutional: He appears well-developed and well-nourished.  HENT:  Head: Normocephalic and atraumatic.  Right Ear: External ear normal.  Left Ear: External ear normal.  Nose: Nose normal.  Mouth/Throat: Oropharynx is clear and moist.  Eyes: Conjunctivae and EOM are normal. Pupils are equal, round, and reactive to light. No scleral icterus.  Neck: Normal range of motion. Neck supple. No JVD present. No thyromegaly present.  Cardiovascular: Regular rhythm, normal heart sounds and intact distal pulses.  Exam reveals no gallop and no friction rub.   No murmur heard. Pulmonary/Chest: Effort normal and breath sounds normal. He exhibits no tenderness.  Abdominal: Soft. Bowel sounds are normal. He exhibits no distension and no mass. There is no tenderness.   Genitourinary: Prostate normal and penis normal.  Musculoskeletal: Normal range of motion. He exhibits no edema or tenderness.  Lymphadenopathy:    He has no cervical adenopathy.  Neurological: He is alert. He has normal reflexes. No cranial nerve deficit. Coordination normal.  Absent vibratory sensation of the feet Intact proprioception Decrease monofilament testing  Patellar reflexes normal Achilles reflexes absent  Skin: Skin is warm and dry. No rash noted.  Patient has an approximate 2-3 cm depression involving the left parietal scalp area  Psychiatric: He has a normal mood and affect. His behavior is normal.          Assessment & Plan:   Preventive health examination Worsening peripheral neuropathy.  Will give a trial of Neurontin and titrated to a dose of 300 mg 3 times a day.  Recheck in 2 months.  Consider neurology follow-up at that time.  We'll check a B12 level Bony depression left parietal scalp area.  Will check radiographs Dyslipidemia.  Continue statin therapy

## 2015-08-27 NOTE — Patient Instructions (Addendum)
Slowly titrate Neurontin to 300 mg 3 times daily  Return in 3 months for follow-up  You need to lose weight.  Consider a lower calorie diet and regular exercise.    It is important that you exercise regularly, at least 20 minutes 3 to 4 times per week.  If you develop chest pain or shortness of breath seek  medical attention.    Health Maintenance, Male A healthy lifestyle and preventative care can promote health and wellness.  Maintain regular health, dental, and eye exams.  Eat a healthy diet. Foods like vegetables, fruits, whole grains, low-fat dairy products, and lean protein foods contain the nutrients you need and are low in calories. Decrease your intake of foods high in solid fats, added sugars, and salt. Get information about a proper diet from your health care provider, if necessary.  Regular physical exercise is one of the most important things you can do for your health. Most adults should get at least 150 minutes of moderate-intensity exercise (any activity that increases your heart rate and causes you to sweat) each week. In addition, most adults need muscle-strengthening exercises on 2 or more days a week.   Maintain a healthy weight. The body mass index (BMI) is a screening tool to identify possible weight problems. It provides an estimate of body fat based on height and weight. Your health care provider can find your BMI and can help you achieve or maintain a healthy weight. For males 20 years and older:  A BMI below 18.5 is considered underweight.  A BMI of 18.5 to 24.9 is normal.  A BMI of 25 to 29.9 is considered overweight.  A BMI of 30 and above is considered obese.  Maintain normal blood lipids and cholesterol by exercising and minimizing your intake of saturated fat. Eat a balanced diet with plenty of fruits and vegetables. Blood tests for lipids and cholesterol should begin at age 82 and be repeated every 5 years. If your lipid or cholesterol levels are high,  you are over age 86, or you are at high risk for heart disease, you may need your cholesterol levels checked more frequently.Ongoing high lipid and cholesterol levels should be treated with medicines if diet and exercise are not working.  If you smoke, find out from your health care provider how to quit. If you do not use tobacco, do not start.  Lung cancer screening is recommended for adults aged 83-80 years who are at high risk for developing lung cancer because of a history of smoking. A yearly low-dose CT scan of the lungs is recommended for people who have at least a 30-pack-year history of smoking and are current smokers or have quit within the past 15 years. A pack year of smoking is smoking an average of 1 pack of cigarettes a day for 1 year (for example, a 30-pack-year history of smoking could mean smoking 1 pack a day for 30 years or 2 packs a day for 15 years). Yearly screening should continue until the smoker has stopped smoking for at least 15 years. Yearly screening should be stopped for people who develop a health problem that would prevent them from having lung cancer treatment.  If you choose to drink alcohol, do not have more than 2 drinks per day. One drink is considered to be 12 oz (360 mL) of beer, 5 oz (150 mL) of wine, or 1.5 oz (45 mL) of liquor.  Avoid the use of street drugs. Do not share needles with  anyone. Ask for help if you need support or instructions about stopping the use of drugs.  High blood pressure causes heart disease and increases the risk of stroke. High blood pressure is more likely to develop in:  People who have blood pressure in the end of the normal range (100-139/85-89 mm Hg).  People who are overweight or obese.  People who are African American.  If you are 42-46 years of age, have your blood pressure checked every 3-5 years. If you are 30 years of age or older, have your blood pressure checked every year. You should have your blood pressure measured  twice--once when you are at a hospital or clinic, and once when you are not at a hospital or clinic. Record the average of the two measurements. To check your blood pressure when you are not at a hospital or clinic, you can use:  An automated blood pressure machine at a pharmacy.  A home blood pressure monitor.  If you are 3-37 years old, ask your health care provider if you should take aspirin to prevent heart disease.  Diabetes screening involves taking a blood sample to check your fasting blood sugar level. This should be done once every 3 years after age 62 if you are at a normal weight and without risk factors for diabetes. Testing should be considered at a younger age or be carried out more frequently if you are overweight and have at least 1 risk factor for diabetes.  Colorectal cancer can be detected and often prevented. Most routine colorectal cancer screening begins at the age of 87 and continues through age 75. However, your health care provider may recommend screening at an earlier age if you have risk factors for colon cancer. On a yearly basis, your health care provider may provide home test kits to check for hidden blood in the stool. A small camera at the end of a tube may be used to directly examine the colon (sigmoidoscopy or colonoscopy) to detect the earliest forms of colorectal cancer. Talk to your health care provider about this at age 11 when routine screening begins. A direct exam of the colon should be repeated every 5-10 years through age 60, unless early forms of precancerous polyps or small growths are found.  People who are at an increased risk for hepatitis B should be screened for this virus. You are considered at high risk for hepatitis B if:  You were born in a country where hepatitis B occurs often. Talk with your health care provider about which countries are considered high risk.  Your parents were born in a high-risk country and you have not received a shot to  protect against hepatitis B (hepatitis B vaccine).  You have HIV or AIDS.  You use needles to inject street drugs.  You live with, or have sex with, someone who has hepatitis B.  You are a man who has sex with other men (MSM).  You get hemodialysis treatment.  You take certain medicines for conditions like cancer, organ transplantation, and autoimmune conditions.  Hepatitis C blood testing is recommended for all people born from [birth year] through 1965 and any individual with known risk factors for hepatitis C.  Healthy men should no longer receive prostate-specific antigen (PSA) blood tests as part of routine cancer screening. Talk to your health care provider about prostate cancer screening.  Testicular cancer screening is not recommended for adolescents or adult males who have no symptoms. Screening includes self-exam, a health care provider exam,  and other screening tests. Consult with your health care provider about any symptoms you have or any concerns you have about testicular cancer.  Practice safe sex. Use condoms and avoid high-risk sexual practices to reduce the spread of sexually transmitted infections (STIs).  You should be screened for STIs, including gonorrhea and chlamydia if:  You are sexually active and are younger than 24 years.  You are older than 24 years, and your health care provider tells you that you are at risk for this type of infection.  Your sexual activity has changed since you were last screened, and you are at an increased risk for chlamydia or gonorrhea. Ask your health care provider if you are at risk.  If you are at risk of being infected with HIV, it is recommended that you take a prescription medicine daily to prevent HIV infection. This is called pre-exposure prophylaxis (PrEP). You are considered at risk if:  You are a man who has sex with other men (MSM).  You are a heterosexual man who is sexually active with multiple partners.  You take drugs by  injection.  You are sexually active with a partner who has HIV.  Talk with your health care provider about whether you are at high risk of being infected with HIV. If you choose to begin PrEP, you should first be tested for HIV. You should then be tested every 3 months for as long as you are taking PrEP.  Use sunscreen. Apply sunscreen liberally and repeatedly throughout the day. You should seek shade when your shadow is shorter than you. Protect yourself by wearing long sleeves, pants, a wide-brimmed hat, and sunglasses year round whenever you are outdoors.  Tell your health care provider of new moles or changes in moles, especially if there is a change in shape or color. Also, tell your health care provider if a mole is larger than the size of a pencil eraser.  A one-time screening for abdominal aortic aneurysm (AAA) and surgical repair of large AAAs by ultrasound is recommended for men aged 20-75 years who are current or former smokers.  Stay current with your vaccines (immunizations).   This information is not intended to replace advice given to you by your health care provider. Make sure you discuss any questions you have with your health care provider.   Document Released: 08/15/2007 Document Revised: 03/09/2014 Document Reviewed: 07/14/2010 Elsevier Interactive Patient Education [employer].

## 2015-08-27 NOTE — Progress Notes (Signed)
Pre visit review using our clinic review tool, if applicable. No additional management support is needed unless otherwise documented below in the visit note. 

## 2015-08-28 LAB — VITAMIN B12: Vitamin B-12: 879 pg/mL (ref 211–911)

## 2015-11-11 ENCOUNTER — Encounter: Payer: Self-pay | Admitting: Gastroenterology

## 2015-11-27 ENCOUNTER — Encounter: Payer: Self-pay | Admitting: Internal Medicine

## 2015-11-27 ENCOUNTER — Ambulatory Visit (INDEPENDENT_AMBULATORY_CARE_PROVIDER_SITE_OTHER): Payer: 59 | Admitting: Internal Medicine

## 2015-11-27 ENCOUNTER — Telehealth: Payer: Self-pay | Admitting: Internal Medicine

## 2015-11-27 VITALS — BP 152/86 | HR 58 | Temp 97.9°F | Resp 20 | Ht 71.0 in | Wt 240.4 lb

## 2015-11-27 DIAGNOSIS — G609 Hereditary and idiopathic neuropathy, unspecified: Secondary | ICD-10-CM | POA: Diagnosis not present

## 2015-11-27 DIAGNOSIS — E785 Hyperlipidemia, unspecified: Secondary | ICD-10-CM | POA: Diagnosis not present

## 2015-11-27 DIAGNOSIS — S43422A Sprain of left rotator cuff capsule, initial encounter: Secondary | ICD-10-CM

## 2015-11-27 DIAGNOSIS — Z23 Encounter for immunization: Secondary | ICD-10-CM

## 2015-11-27 DIAGNOSIS — I10 Essential (primary) hypertension: Secondary | ICD-10-CM | POA: Diagnosis not present

## 2015-11-27 MED ORDER — DULOXETINE HCL 20 MG PO CPEP: 40.0000 mg | ORAL_CAPSULE | Freq: Every day | ORAL | Status: DC

## 2015-11-27 NOTE — Patient Instructions (Addendum)
Impingement Syndrome, Rotator Cuff, Bursitis With Rehab °Impingement syndrome is a condition that involves inflammation of the tendons of the rotator cuff and the subacromial bursa, that causes pain in the shoulder. The rotator cuff consists of four tendons and muscles that control much of the shoulder and upper arm function. The subacromial bursa is a fluid filled sac that helps reduce friction between the rotator cuff and one of the bones of the shoulder (acromion). Impingement syndrome is usually an overuse injury that causes swelling of the bursa (bursitis), swelling of the tendon (tendonitis), and/or a tear of the tendon (strain). Strains are classified into three categories. Grade 1 strains cause pain, but the tendon is not lengthened. Grade 2 strains include a lengthened ligament, due to the ligament being stretched or partially ruptured. With grade 2 strains there is still function, although the function may be decreased. Grade 3 strains include a complete tear of the tendon or muscle, and function is usually impaired. °SYMPTOMS  °· Pain around the shoulder, often at the outer portion of the upper arm. °· Pain that gets worse with shoulder function, especially when reaching overhead or lifting. °· Sometimes, aching when not using the arm. °· Pain that wakes you up at night. °· Sometimes, tenderness, swelling, warmth, or redness over the affected area. °· Loss of strength. °· Limited motion of the shoulder, especially reaching behind the back (to the back pocket or to unhook bra) or across your body. °· Crackling sound (crepitation) when moving the arm. °· Biceps tendon pain and inflammation (in the front of the shoulder). Worse when bending the elbow or lifting. °CAUSES  °Impingement syndrome is often an overuse injury, in which chronic (repetitive) motions cause the tendons or bursa to become inflamed. A strain occurs when a force is paced on the tendon or muscle that is greater than it can withstand.  Common mechanisms of injury include: °Stress from sudden increase in duration, frequency, or intensity of training. °· Direct hit (trauma) to the shoulder. °· Aging, erosion of the tendon with normal use. °· Bony bump on shoulder (acromial spur). °RISK INCREASES WITH: °· Contact sports (football, wrestling, boxing). °· Throwing sports (baseball, tennis, volleyball). °· Weightlifting and bodybuilding. °· Heavy labor. °· Previous injury to the rotator cuff, including impingement. °· Poor shoulder strength and flexibility. °· Failure to warm up properly before activity. °· Inadequate protective equipment. °· Old age. °· Bony bump on shoulder (acromial spur). °PREVENTION  °· Warm up and stretch properly before activity. °· Allow for adequate recovery between workouts. °· Maintain physical fitness: °¨ Strength, flexibility, and endurance. °¨ Cardiovascular fitness. °· Learn and use proper exercise technique. °PROGNOSIS  °If treated properly, impingement syndrome usually goes away within 6 weeks. Sometimes surgery is required.  °RELATED COMPLICATIONS  °· Longer healing time if not properly treated, or if not given enough time to heal. °· Recurring symptoms, that result in a chronic condition. °· Shoulder stiffness, frozen shoulder, or loss of motion. °· Rotator cuff tendon tear. °· Recurring symptoms, especially if activity is resumed too soon, with overuse, with a direct blow, or when using poor technique. °TREATMENT  °Treatment first involves the use of ice and medicine, to reduce pain and inflammation. The use of strengthening and stretching exercises may help reduce pain with activity. These exercises may be performed at home or with a therapist. If non-surgical treatment is unsuccessful after more than 6 months, surgery may be advised. After surgery and rehabilitation, activity is usually possible in 3 months.  °  MEDICATION  If pain medicine is needed, nonsteroidal anti-inflammatory medicines (aspirin and  ibuprofen), or other minor pain relievers (acetaminophen), are often advised.  Do not take pain medicine for 7 days before surgery.  Prescription pain relievers may be given, if your caregiver thinks they are needed. Use only as directed and only as much as you need.  Corticosteroid injections may be given by your caregiver. These injections should be reserved for the most serious cases, because they may only be given a certain number of times. HEAT AND COLD  Cold treatment (icing) should be applied for 10 to 15 minutes every 2 to 3 hours for inflammation and pain, and immediately after activity that aggravates your symptoms. Use ice packs or an ice massage.  Heat treatment may be used before performing stretching and strengthening activities prescribed by your caregiver, physical therapist, or athletic trainer. Use a heat pack or a warm water soak. SEEK MEDICAL CARE IF:   Symptoms get worse or do not improve in 4 to 6 weeks, despite treatment.  New, unexplained symptoms develop. (Drugs used in treatment may produce side effects.) EXERCISES  RANGE OF MOTION (ROM) AND STRETCHING EXERCISES - Impingement Syndrome (Rotator Cuff  Tendinitis, Bursitis) These exercises may help you when beginning to rehabilitate your injury. Your symptoms may go away with or without further involvement from your physician, physical therapist or athletic trainer. While completing these exercises, remember:   Restoring tissue flexibility helps normal motion to return to the joints. This allows healthier, less painful movement and activity.  An effective stretch should be held for at least 30 seconds.  A stretch should never be painful. You should only feel a gentle lengthening or release in the stretched tissue. STRETCH - Flexion, Standing  Stand with good posture. With an underhand grip on your right / left hand, and an overhand grip on the opposite hand, grasp a broomstick or cane so that your hands are a  little more than shoulder width apart.  Keeping your right / left elbow straight and shoulder muscles relaxed, push the stick with your opposite hand, to raise your right / left arm in front of your body and then overhead. Raise your arm until you feel a stretch in your right / left shoulder, but before you have increased shoulder pain.  Try to avoid shrugging your right / left shoulder as your arm rises, by keeping your shoulder blade tucked down and toward your mid-back spine. Hold for __________ seconds.  Slowly return to the starting position. Repeat __________ times. Complete this exercise __________ times per day. STRETCH - Abduction, Supine  Lie on your back. With an underhand grip on your right / left hand and an overhand grip on the opposite hand, grasp a broomstick or cane so that your hands are a little more than shoulder width apart.  Keeping your right / left elbow straight and your shoulder muscles relaxed, push the stick with your opposite hand, to raise your right / left arm out to the side of your body and then overhead. Raise your arm until you feel a stretch in your right / left shoulder, but before you have increased shoulder pain.  Try to avoid shrugging your right / left shoulder as your arm rises, by keeping your shoulder blade tucked down and toward your mid-back spine. Hold for __________ seconds.  Slowly return to the starting position. Repeat __________ times. Complete this exercise __________ times per day. ROM - Flexion, Active-Assisted  Lie on your back.  You may bend your knees for comfort.  Grasp a broomstick or cane so your hands are about shoulder width apart. Your right / left hand should grip the end of the stick, so that your hand is positioned "thumbs-up," as if you were about to shake hands.  Using your healthy arm to lead, raise your right / left arm overhead, until you feel a gentle stretch in your shoulder. Hold for __________ seconds.  Use the stick  to assist in returning your right / left arm to its starting position. Repeat __________ times. Complete this exercise __________ times per day.  ROM - Internal Rotation, Supine   Lie on your back on a firm surface. Place your right / left elbow about 60 degrees away from your side. Elevate your elbow with a folded towel, so that the elbow and shoulder are the same height.  Using a broomstick or cane and your strong arm, pull your right / left hand toward your body until you feel a gentle stretch, but no increase in your shoulder pain. Keep your shoulder and elbow in place throughout the exercise.  Hold for __________ seconds. Slowly return to the starting position. Repeat __________ times. Complete this exercise __________ times per day. STRETCH - Internal Rotation  Place your right / left hand behind your back, palm up.  Throw a towel or belt over your opposite shoulder. Grasp the towel with your right / left hand.  While keeping an upright posture, gently pull up on the towel, until you feel a stretch in the front of your right / left shoulder.  Avoid shrugging your right / left shoulder as your arm rises, by keeping your shoulder blade tucked down and toward your mid-back spine.  Hold for __________ seconds. Release the stretch, by lowering your healthy hand. Repeat __________ times. Complete this exercise __________ times per day. ROM - Internal Rotation   Using an underhand grip, grasp a stick behind your back with both hands.  While standing upright with good posture, slide the stick up your back until you feel a mild stretch in the front of your shoulder.  Hold for __________ seconds. Slowly return to your starting position. Repeat __________ times. Complete this exercise __________ times per day.  STRETCH - Posterior Shoulder Capsule   Stand or sit with good posture. Grasp your right / left elbow and draw it across your chest, keeping it at the same height as your  shoulder.  Pull your elbow, so your upper arm comes in closer to your chest. Pull until you feel a gentle stretch in the back of your shoulder.  Hold for __________ seconds. Repeat __________ times. Complete this exercise __________ times per day. STRENGTHENING EXERCISES - Impingement Syndrome (Rotator Cuff Tendinitis, Bursitis) These exercises may help you when beginning to rehabilitate your injury. They may resolve your symptoms with or without further involvement from your physician, physical therapist or athletic trainer. While completing these exercises, remember:  Muscles can gain both the endurance and the strength needed for everyday activities through controlled exercises.  Complete these exercises as instructed by your physician, physical therapist or athletic trainer. Increase the resistance and repetitions only as guided.  You may experience muscle soreness or fatigue, but the pain or discomfort you are trying to eliminate should never worsen during these exercises. If this pain does get worse, stop and make sure you are following the directions exactly. If the pain is still present after adjustments, discontinue the exercise until you can discuss   the trouble with your clinician.  During your recovery, avoid activity or exercises which involve actions that place your injured hand or elbow above your head or behind your back or head. These positions stress the tissues which you are trying to heal. STRENGTH - Scapular Depression and Adduction   With good posture, sit on a firm chair. Support your arms in front of you, with pillows, arm rests, or on a table top. Have your elbows in line with the sides of your body.  Gently draw your shoulder blades down and toward your mid-back spine. Gradually increase the tension, without tensing the muscles along the top of your shoulders and the back of your neck.  Hold for __________ seconds. Slowly release the tension and relax your muscles  completely before starting the next repetition.  After you have practiced this exercise, remove the arm support and complete the exercise in standing as well as sitting position. Repeat __________ times. Complete this exercise __________ times per day.  STRENGTH - Shoulder Abductors, Isometric  With good posture, stand or sit about 4-6 inches from a wall, with your right / left side facing the wall.  Bend your right / left elbow. Gently press your right / left elbow into the wall. Increase the pressure gradually, until you are pressing as hard as you can, without shrugging your shoulder or increasing any shoulder discomfort.  Hold for __________ seconds.  Release the tension slowly. Relax your shoulder muscles completely before you begin the next repetition. Repeat __________ times. Complete this exercise __________ times per day.  STRENGTH - External Rotators, Isometric  Keep your right / left elbow at your side and bend it 90 degrees.  Step into a door frame so that the outside of your right / left wrist can press against the door frame without your upper arm leaving your side.  Gently press your right / left wrist into the door frame, as if you were trying to swing the back of your hand away from your stomach. Gradually increase the tension, until you are pressing as hard as you can, without shrugging your shoulder or increasing any shoulder discomfort.  Hold for __________ seconds.  Release the tension slowly. Relax your shoulder muscles completely before you begin the next repetition. Repeat __________ times. Complete this exercise __________ times per day.  STRENGTH - Supraspinatus   Stand or sit with good posture. Grasp a __________ weight, or an exercise band or tubing, so that your hand is "thumbs-up," like you are shaking hands.  Slowly lift your right / left arm in a "V" away from your thigh, diagonally into the space between your side and straight ahead. Lift your hand to  shoulder height or as far as you can, without increasing any shoulder pain. At first, many people do not lift their hands above shoulder height.  Avoid shrugging your right / left shoulder as your arm rises, by keeping your shoulder blade tucked down and toward your mid-back spine.  Hold for __________ seconds. Control the descent of your hand, as you slowly return to your starting position. Repeat __________ times. Complete this exercise __________ times per day.  STRENGTH - External Rotators  Secure a rubber exercise band or tubing to a fixed object (table, pole) so that it is at the same height as your right / left elbow when you are standing or sitting on a firm surface.  Stand or sit so that the secured exercise band is at your uninjured side.  Bend   your right / left elbow 90 degrees. Place a folded towel or small pillow under your right / left arm, so that your elbow is a few inches away from your side.  Keeping the tension on the exercise band, pull it away from your body, as if pivoting on your elbow. Be sure to keep your body steady, so that the movement is coming only from your rotating shoulder.  Hold for __________ seconds. Release the tension in a controlled manner, as you return to the starting position. Repeat __________ times. Complete this exercise __________ times per day.  STRENGTH - Internal Rotators   Secure a rubber exercise band or tubing to a fixed object (table, pole) so that it is at the same height as your right / left elbow when you are standing or sitting on a firm surface.  Stand or sit so that the secured exercise band is at your right / left side.  Bend your elbow 90 degrees. Place a folded towel or small pillow under your right / left arm so that your elbow is a few inches away from your side.  Keeping the tension on the exercise band, pull it across your body, toward your stomach. Be sure to keep your body steady, so that the movement is coming only from  your rotating shoulder.  Hold for __________ seconds. Release the tension in a controlled manner, as you return to the starting position. Repeat __________ times. Complete this exercise __________ times per day.  STRENGTH - Scapular Protractors, Standing   Stand arms length away from a wall. Place your hands on the wall, keeping your elbows straight.  Begin by dropping your shoulder blades down and toward your mid-back spine.  To strengthen your protractors, keep your shoulder blades down, but slide them forward on your rib cage. It will feel as if you are lifting the back of your rib cage away from the wall. This is a subtle motion and can be challenging to complete. Ask your caregiver for further instruction, if you are not sure you are doing the exercise correctly.  Hold for __________ seconds. Slowly return to the starting position, resting the muscles completely before starting the next repetition. Repeat __________ times. Complete this exercise __________ times per day. STRENGTH - Scapular Protractors, Supine  Lie on your back on a firm surface. Extend your right / left arm straight into the air while holding a __________ weight in your hand.  Keeping your head and back in place, lift your shoulder off the floor.  Hold for __________ seconds. Slowly return to the starting position, and allow your muscles to relax completely before starting the next repetition. Repeat __________ times. Complete this exercise __________ times per day. STRENGTH - Scapular Protractors, Quadruped  Get onto your hands and knees, with your shoulders directly over your hands (or as close as you can be, comfortably).  Keeping your elbows locked, lift the back of your rib cage up into your shoulder blades, so your mid-back rounds out. Keep your neck muscles relaxed.  Hold this position for __________ seconds. Slowly return to the starting position and allow your muscles to relax completely before starting the  next repetition. Repeat __________ times. Complete this exercise __________ times per day.  STRENGTH - Scapular Retractors  Secure a rubber exercise band or tubing to a fixed object (table, pole), so that it is at the height of your shoulders when you are either standing, or sitting on a firm armless chair.  With a   palm down grip, grasp an end of the band in each hand. Straighten your elbows and lift your hands straight in front of you, at shoulder height. Step back, away from the secured end of the band, until it becomes tense.  Squeezing your shoulder blades together, draw your elbows back toward your sides, as you bend them. Keep your upper arms lifted away from your body throughout the exercise.  Hold for __________ seconds. Slowly ease the tension on the band, as you reverse the directions and return to the starting position. Repeat __________ times. Complete this exercise __________ times per day. STRENGTH - Shoulder Extensors   Secure a rubber exercise band or tubing to a fixed object (table, pole) so that it is at the height of your shoulders when you are either standing, or sitting on a firm armless chair.  With a thumbs-up grip, grasp an end of the band in each hand. Straighten your elbows and lift your hands straight in front of you, at shoulder height. Step back, away from the secured end of the band, until it becomes tense.  Squeezing your shoulder blades together, pull your hands down to the sides of your thighs. Do not allow your hands to go behind you.  Hold for __________ seconds. Slowly ease the tension on the band, as you reverse the directions and return to the starting position. Repeat __________ times. Complete this exercise __________ times per day.  STRENGTH - Scapular Retractors and External Rotators   Secure a rubber exercise band or tubing to a fixed object (table, pole) so that it is at the height as your shoulders, when you are either standing, or sitting on a  firm armless chair.  With a palm down grip, grasp an end of the band in each hand. Bend your elbows 90 degrees and lift your elbows to shoulder height, at your sides. Step back, away from the secured end of the band, until it becomes tense.  Squeezing your shoulder blades together, rotate your shoulders so that your upper arms and elbows remain stationary, but your fists travel upward to head height.  Hold for __________ seconds. Slowly ease the tension on the band, as you reverse the directions and return to the starting position. Repeat __________ times. Complete this exercise __________ times per day.  STRENGTH - Scapular Retractors and External Rotators, Rowing   Secure a rubber exercise band or tubing to a fixed object (table, pole) so that it is at the height of your shoulders, when you are either standing, or sitting on a firm armless chair.  With a palm down grip, grasp an end of the band in each hand. Straighten your elbows and lift your hands straight in front of you, at shoulder height. Step back, away from the secured end of the band, until it becomes tense.  Step 1: Squeeze your shoulder blades together. Bending your elbows, draw your hands to your chest, as if you are rowing a boat. At the end of this motion, your hands and elbow should be at shoulder height and your elbows should be out to your sides.  Step 2: Rotate your shoulders, to raise your hands above your head. Your forearms should be vertical and your upper arms should be horizontal.  Hold for __________ seconds. Slowly ease the tension on the band, as you reverse the directions and return to the starting position. Repeat __________ times. Complete this exercise __________ times per day.  STRENGTH - Scapular Depressors  Find a sturdy chair   without wheels, such as a dining room chair.  Keeping your feet on the floor, and your hands on the chair arms, lift your bottom up from the seat, and lock your elbows.  Keeping  your elbows straight, allow gravity to pull your body weight down. Your shoulders will rise toward your ears.  Raise your body against gravity by drawing your shoulder blades down your back, shortening the distance between your shoulders and ears. Although your feet should always maintain contact with the floor, your feet should progressively support less body weight, as you get stronger.  Hold for __________ seconds. In a controlled and slow manner, lower your body weight to begin the next repetition. Repeat __________ times. Complete this exercise __________ times per day.    This information is not intended to replace advice given to you by your health care provider. Make sure you discuss any questions you have with your health care provider.   Document Released: 02/16/2005 Document Revised: 03/09/2014 Document Reviewed: 05/31/2008 Elsevier Interactive Patient Education 2016 [place] follow-up  Neurology follow-up

## 2015-11-27 NOTE — Telephone Encounter (Signed)
° ° °  Pt request refill of the following:     DULoxetine (CYMBALTA) DR capsule 40 mg  Pt sai this was suppose to be called in and he contacted the pharmacy and they said they have not received the rx     Phamacy:   [facility]

## 2015-11-27 NOTE — Progress Notes (Signed)
Pre visit review using our clinic review tool, if applicable. No additional management support is needed unless otherwise documented below in the visit note. 

## 2015-11-27 NOTE — Progress Notes (Signed)
Subjective:    Patient ID: Willie Dominguez, male    DOB: [birth year], 65 y.o.   MRN: [id number]  HPI  65 year old patient who is seen today in follow-up.  He has a history of idiopathic for full neuropathy that has  continued to worsen.  He describes numbness as well as a painful dysesthesia.  His feet have worsened to the point he feels at times uncomfortable driving a car.  Foot pain limits his activities and at times feels that he is walking on hot coals.  He also has developed some hand involvement and at times drops objects.  He has been evaluated by neurology in the past.  He wishes a second toe neurological opinion. He has been on gabapentin.  He has been unable to uptitrate dose beyond 100 mg 3 times a day. He has dyslipidemia and remains on Crestor He has essential hypertension  .  Additionally, over the past few months he has developed progressive left shoulder pain and decreased range of motion  Past Medical History:  Diagnosis Date  . Arthritis   . 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 H/O benign prostatic hypertrophy    mild  . High blood pressure   . History of BPH   . Numbness of toes   . Other and unspecified hyperlipidemia     with elevated lipoprotein (a)  . Personal history of urinary calculi   . Retention of urine, unspecified   . Thrombocytopenia ([place])   . Unspecified adverse effect of unspecified drug, medicinal and biological substance   . Unspecified essential hypertension   . Urticaria, unspecified      Social History   Social History  . Marital status: Married    Spouse name: Willie Dominguez  . Number of children: 2  . Years of education: college   Occupational History  . Pension scheme manager Employed   Social History Main Topics  . Smoking status: Never Smoker  . Smokeless tobacco: Never Used  . Alcohol use No  . Drug use: No  . Sexual activity: Not on file   Other Topics Concern  . Not on file   Social History Narrative    Married lives at home with his wife (Willie Dominguez)    self employed.   College education   Right handed   Caffeine three cokes daily                   Past Surgical History:  Procedure Laterality Date  . colonoscopy November 2007    . Inguinal herniorrhaphy  age 65    . LAPAROTOMY      Family History  Problem Relation Age of Onset  . COPD Mother   . Lung cancer Mother   . Diabetes Father   . Other Father     CABG  . Heart Problems Father   . Coronary artery disease Maternal Aunt     Allergies  Allergen Reactions  . Methotrexate Derivatives Other (See Comments)    Mouth ulcers  . Lipitor [Atorvastatin] Rash  . Bystolic [Nebivolol Hcl]   . Doxazosin     REACTION: aggrivated prostate, weakend stream, bladder pain and irritation  . Lisinopril     REACTION: Intolerance.  Depressive mood, fatigue  . Penicillins     REACTION: rash  . Simvastatin     REACTION: rash    Current Outpatient Prescriptions on File Prior to Visit  Medication Sig Dispense Refill  . aspirin 81  MG tablet Take 81 mg by mouth daily.      . Cholecalciferol (VITAMIN D3) 2000 units TABS Take 2 tablets by mouth daily.    . Coenzyme Q10 (CO Q-10) 200 MG CAPS Take 1 capsule by mouth daily.    . Cyanocobalamin (B-12 PO) Take 1,000 mcg by mouth daily.     [name] gabapentin (NEURONTIN) 100 MG capsule Take 1 capsule (100 mg total) by mouth 3 (three) times daily. 90 capsule 3  . Omega-3 Fatty Acids (FISH OIL) 1200 MG CAPS Take 2 capsules by mouth daily.    . rosuvastatin (CRESTOR) 10 MG tablet Take 10 mg by mouth daily.  2  . valsartan (DIOVAN) 80 MG tablet Take 80 mg by mouth daily.  2   No current facility-administered medications on file prior to visit.     BP (!) 152/86 (BP Location: Left Arm, Patient Position: Sitting, Cuff Size: Normal)   Pulse (!) 58   Temp 97.9 F (36.6 C) (Oral)   Resp 20   Ht 5\' 11"  (1.803 m)   Wt 240 lb 6.1 oz (109 kg)   SpO2 97%   BMI 33.53 kg/m       Review of Systems    Constitutional: Negative for appetite change, chills, fatigue and fever.  HENT: Negative for congestion, dental problem, ear pain, hearing loss, sore throat, tinnitus, trouble swallowing and voice change.   Eyes: Negative for pain, discharge and visual disturbance.  Respiratory: Negative for cough, chest tightness, wheezing and stridor.   Cardiovascular: Negative for chest pain, palpitations and leg swelling.  Gastrointestinal: Negative for abdominal distention, abdominal pain, blood in stool, constipation, diarrhea, nausea and vomiting.  Genitourinary: Negative for difficulty urinating, discharge, flank pain, genital sores, hematuria and urgency.  Musculoskeletal: Positive for arthralgias. Negative for back pain, gait problem, joint swelling, myalgias and neck stiffness.  Skin: Negative for rash.  Neurological: Positive for numbness. Negative for dizziness, syncope, speech difficulty, weakness and headaches.  Hematological: Negative for adenopathy. Does not bruise/bleed easily.  Psychiatric/Behavioral: Negative for behavioral problems and dysphoric mood. The patient is not nervous/anxious.        Objective:   Physical Exam  Constitutional: He is oriented to person, place, and time. He appears well-developed.  HENT:  Head: Normocephalic.  Right Ear: External ear normal.  Left Ear: External ear normal.  Eyes: Conjunctivae and EOM are normal.  Neck: Normal range of motion.  Cardiovascular: Normal rate and normal heart sounds.   Pulmonary/Chest: Breath sounds normal.  Abdominal: Bowel sounds are normal.  Musculoskeletal: He exhibits no edema or tenderness.  Decreased range of motion with discomfort.   Neurological: He is alert and oriented to person, place, and time.  Psychiatric: He has a normal mood and affect. His behavior is normal.          Assessment & Plan:   Worsening idiopathic peripheral neuropathy.  Neurology referral.  Add Cymbalta 30.  Left rotator cuff  tendinopathy.  Will give rehabilitation exercises.  Orthopedic referral Essential hypertension Dyslipidemia.  Continue statin therapy  Follow-up 6 months or as needed  Willie Dominguez

## 2015-11-28 NOTE — Telephone Encounter (Signed)
Left message on voicemail to call office. Need to discuss medication.

## 2015-11-28 NOTE — Telephone Encounter (Signed)
Pt called back, told him need to discuss dosage with Dr.K in the morning was ordered incorrectly and I will get back to him tomorrow. Pt verbalized understanding.

## 2015-11-29 ENCOUNTER — Encounter: Payer: Self-pay | Admitting: Gastroenterology

## 2015-11-29 MED ORDER — DULOXETINE HCL 30 MG PO CPEP: 30.0000 mg | ORAL_CAPSULE | Freq: Every day | ORAL | 1 refills | Status: DC

## 2015-11-29 NOTE — Telephone Encounter (Signed)
Cymbalta 30 mg every morning

## 2015-11-29 NOTE — Telephone Encounter (Signed)
Spoke to pt, told him Rx for Cymbalta 30 mg was sent to pharmacy. Pt verbalized understanding.

## 2015-11-29 NOTE — Telephone Encounter (Signed)
Dr.K wrong Rx was sent in. Please advise dosage of Cymbalta?

## 2015-12-05 ENCOUNTER — Ambulatory Visit (AMBULATORY_SURGERY_CENTER): Payer: Self-pay | Admitting: *Deleted

## 2015-12-05 VITALS — Ht 71.0 in | Wt 235.0 lb

## 2015-12-05 DIAGNOSIS — Z1211 Encounter for screening for malignant neoplasm of colon: Secondary | ICD-10-CM

## 2015-12-05 MED ORDER — NA SULFATE-K SULFATE-MG SULF 17.5-3.13-1.6 GM/177ML PO SOLN: 1.0000 | Freq: Once | ORAL | 0 refills | Status: AC

## 2015-12-05 NOTE — Progress Notes (Signed)
No egg or soy allergy known to patient  No issues with past sedation with any surgeries  or procedures, no intubation problems  No diet pills per patient No home 02 use per patient  No blood thinners per patient  Pt denies issues with constipation  No A fib or A flutter   

## 2016-01-01 ENCOUNTER — Ambulatory Visit (INDEPENDENT_AMBULATORY_CARE_PROVIDER_SITE_OTHER): Payer: 59 | Admitting: Family Medicine

## 2016-01-01 ENCOUNTER — Encounter: Payer: Self-pay | Admitting: Family Medicine

## 2016-01-01 ENCOUNTER — Telehealth: Payer: Self-pay | Admitting: Internal Medicine

## 2016-01-01 VITALS — BP 119/72 | HR 60 | Temp 98.3°F | Ht 71.0 in | Wt 237.0 lb

## 2016-01-01 DIAGNOSIS — N200 Calculus of kidney: Secondary | ICD-10-CM | POA: Diagnosis not present

## 2016-01-01 LAB — POC URINALSYSI DIPSTICK (AUTOMATED)
Bilirubin, UA: NEGATIVE
Glucose, UA: NEGATIVE
KETONES UA: NEGATIVE
Leukocytes, UA: NEGATIVE
Nitrite, UA: NEGATIVE
Urobilinogen, UA: 0.2
pH, UA: 6

## 2016-01-01 MED ORDER — TAMSULOSIN HCL 0.4 MG PO CAPS: 0.4000 mg | ORAL_CAPSULE | Freq: Every day | ORAL | 0 refills | Status: DC

## 2016-01-01 MED ORDER — OXYCODONE-ACETAMINOPHEN 10-325 MG PO TABS: 1.0000 | ORAL_TABLET | Freq: Four times a day (QID) | ORAL | 0 refills | Status: DC | PRN

## 2016-01-01 NOTE — Progress Notes (Signed)
   Subjective:    Patient ID: Willie Dominguez, male    DOB: [birth year], 65 y.o.   MRN: [id number]  HPI Here for the sudden onset yesterday afternoon of a sharp severe pain in the left middle back that he thinks is due to a kidney stone. The pain waxes and wanes, but it has not moved. He has had nausea and vomiting with this. He passes urine but he feels like it has trouble coming out. No blood has been seen. No fever. His BMs are normal. He had a similar episode in the mid 1980s while living in [place], [place]. He had a large kidney stone at that time and he was sent for lithotripsy. This was not successful so he underwent a basket retrieval procedure. He took some Tylenol at home last night with no relief.    Review of Systems  Constitutional: Negative.   Gastrointestinal: Negative.   Genitourinary: Positive for difficulty urinating. Negative for dysuria, flank pain, frequency, hematuria, testicular pain and urgency.  Musculoskeletal: Positive for back pain.       Objective:   Physical Exam  Constitutional: He appears well-developed and well-nourished. No distress.  Cardiovascular: Normal rate, regular rhythm, normal heart sounds and intact distal pulses.   Pulmonary/Chest: Effort normal and breath sounds normal.  Abdominal: Soft. Bowel sounds are normal. He exhibits no distension and no mass. There is no rebound and no guarding.  Mildly tender in the left flank           Assessment & Plan:  Probable kidney stone. Given Percocet for pain and Flomax to help with passage. Set up a CT renal stone study today. Drink plenty of water.  Laurey Morale, MD

## 2016-01-01 NOTE — Telephone Encounter (Signed)
Pt calling to ck the status of the CT or MRI that Dr. Sarajane Jews was going to schedule for him.

## 2016-01-01 NOTE — Addendum Note (Signed)
Addended by: Aggie Hacker A on: 01/01/2016 10:39 AM   Modules accepted: Orders

## 2016-01-01 NOTE — Progress Notes (Signed)
Pre visit review using our clinic review tool, if applicable. No additional management support is needed unless otherwise documented below in the visit note. 

## 2016-01-01 NOTE — Telephone Encounter (Signed)
Dr. Sarajane Jews wanted to check on this, was hoping pt could have it done today?

## 2016-01-02 ENCOUNTER — Ambulatory Visit (INDEPENDENT_AMBULATORY_CARE_PROVIDER_SITE_OTHER)
Admission: RE | Admit: 2016-01-02 | Discharge: 2016-01-02 | Disposition: A | Discharge status: Home / Self Care | Payer: 59 | Source: Ambulatory Visit | Attending: Family Medicine | Admitting: Family Medicine

## 2016-01-02 DIAGNOSIS — N200 Calculus of kidney: Secondary | ICD-10-CM | POA: Diagnosis not present

## 2016-01-10 ENCOUNTER — Encounter: Payer: Self-pay | Admitting: Gastroenterology

## 2016-01-13 ENCOUNTER — Other Ambulatory Visit: Payer: Self-pay | Admitting: Orthopedic Surgery

## 2016-01-13 DIAGNOSIS — M75102 Unspecified rotator cuff tear or rupture of left shoulder, not specified as traumatic: Secondary | ICD-10-CM

## 2016-01-21 ENCOUNTER — Telehealth: Payer: Self-pay | Admitting: Gastroenterology

## 2016-01-21 ENCOUNTER — Encounter: Payer: Self-pay | Admitting: Gastroenterology

## 2016-01-21 ENCOUNTER — Ambulatory Visit (AMBULATORY_SURGERY_CENTER): Payer: 59 | Admitting: Gastroenterology

## 2016-01-21 VITALS — BP 146/78 | HR 60 | Temp 98.6°F | Resp 28 | Ht 71.0 in | Wt 237.0 lb

## 2016-01-21 DIAGNOSIS — Z1211 Encounter for screening for malignant neoplasm of colon: Secondary | ICD-10-CM | POA: Diagnosis present

## 2016-01-21 DIAGNOSIS — D122 Benign neoplasm of ascending colon: Secondary | ICD-10-CM

## 2016-01-21 DIAGNOSIS — Z1212 Encounter for screening for malignant neoplasm of rectum: Secondary | ICD-10-CM

## 2016-01-21 MED ORDER — SODIUM CHLORIDE 0.9 % IV SOLN: 500.0000 mL | INTRAVENOUS | Status: DC

## 2016-01-21 NOTE — Progress Notes (Signed)
Report to PACU, RN, vss, BBS= Clear.  

## 2016-01-21 NOTE — Op Note (Signed)
[facility] Patient Name: Willie Dominguez Procedure Date: 01/21/2016 12:59 PM MRN: [id number] Endoscopist: Mallie Mussel L. Loletha Carrow , MD Age: 65 Referring MD:  Date of Birth: 20-Sep-1950 Gender: Male Account #: [id number] Procedure:                Colonoscopy Indications:              Screening for colorectal malignant neoplasm (no                            polyps on colonoscopy 12/2005) Medicines:                Monitored Anesthesia Care Procedure:                Pre-Anesthesia Assessment:                           - Prior to the procedure, a History and Physical                            was performed, and patient medications and                            allergies were reviewed. The patient's tolerance of                            previous anesthesia was also reviewed. The risks                            and benefits of the procedure and the sedation                            options and risks were discussed with the patient.                            All questions were answered, and informed consent                            was obtained. Anticoagulants: The patient has taken                            aspirin. It was decided not to withhold this                            medication prior to the procedure. ASA Grade                            Assessment: II - A patient with mild systemic                            disease. After reviewing the risks and benefits,                            the patient was deemed in satisfactory condition to  undergo the procedure.                           After obtaining informed consent, the colonoscope                            was passed under direct vision. Throughout the                            procedure, the patient's blood pressure, pulse, and                            oxygen saturations were monitored continuously. The                            Model CF-HQ190L [id number]) scope was introduced                         through the anus and advanced to the the cecum,                            identified by appendiceal orifice and ileocecal                            valve. The colonoscopy was performed with                            difficulty due to significant looping. The quality                            of the bowel preparation was evaluated using the                            BBPS [facility] Bowel Preparation Scale) with scores                            of: Right Colon = 2, Transverse Colon = 2 and Left                            Colon = 2. The total BBPS score equals 6. The                            quality of the bowel preparation was good. The                            ileocecal valve, appendiceal orifice, and rectum                            were photographed. The bowel preparation used was                            SUPREP. Scope In: 1:34:47 PM Scope Out: 1:55:56 PM Scope Withdrawal Time: 0 hours 15 minutes 23 seconds  Total Procedure Duration: 0 hours 21  minutes 9 seconds  Findings:                 The perianal and digital rectal examinations were                            normal.                           A 6 mm polyp was found in the proximal ascending                            colon. The polyp was sessile. The polyp was removed                            with a cold snare. Resection and retrieval were                            complete.                           A 15 mm x 34mm polyp was found in the cecum. The                            polyp was sessile, with most of it on a fold and                            the remainder flat and carpet-like adjacent to the                            fold.. Area was unsuccessfully injected with 12 mL                            saline for lesion assessment, but the lesion could                            not be lifted adequately. It was neither removed                            nor biopsied. Positioning was also challenging  due                            to scope looping from a redundant colon.                           Multiple medium-mouthed diverticula were found in                            the left colon.                           The exam was otherwise without abnormality on                            direct and  retroflexion views. Complications:            No immediate complications. Estimated Blood Loss:     Estimated blood loss: none. Impression:               - One 6 mm polyp in the proximal ascending colon,                            removed with a cold snare. Resected and retrieved.                           - One 15 mm polyp in the cecum. Treatment not                            successful.                           - Diverticulosis in the left colon.                           - The examination was otherwise normal on direct                            and retroflexion views. Recommendation:           - Patient has a contact number available for                            emergencies. The signs and symptoms of potential                            delayed complications were discussed with the                            patient. Return to normal activities tomorrow.                            Written discharge instructions were provided to the                            patient.                           - Resume previous diet.                           - Continue present medications.                           - Await pathology results.                           - Refer to advanced endoscopist at [facility]                            for colonoscopy and polypectomy.                           -  Repeat colonoscopy is recommended for                            surveillance. The colonoscopy date will be                            determined after pathology results from today's                            exam become available for review. Henry L. Loletha Carrow, MD 01/21/2016 2:04:52 PM This report has  been signed electronically.

## 2016-01-21 NOTE — Patient Instructions (Signed)
YOU HAD AN ENDOSCOPIC PROCEDURE TODAY AT [facility]:   Refer to the procedure report that was given to you for any specific questions about what was found during the examination.  If the procedure report does not answer your questions, please call your gastroenterologist to clarify.  If you requested that your care partner not be given the details of your procedure findings, then the procedure report has been included in a sealed envelope for you to review at your convenience later.  YOU SHOULD EXPECT: Some feelings of bloating in the abdomen. Passage of more gas than usual.  Walking can help get rid of the air that was put into your GI tract during the procedure and reduce the bloating. If you had a lower endoscopy (such as a colonoscopy or flexible sigmoidoscopy) you may notice spotting of blood in your stool or on the toilet paper. If you underwent a bowel prep for your procedure, you may not have a normal bowel movement for a few days.  Please Note:  You might notice some irritation and congestion in your nose or some drainage.  This is from the oxygen used during your procedure.  There is no need for concern and it should clear up in a day or so.  SYMPTOMS TO REPORT IMMEDIATELY:   Following lower endoscopy (colonoscopy or flexible sigmoidoscopy):  Excessive amounts of blood in the stool  Significant tenderness or worsening of abdominal pains  Swelling of the abdomen that is new, acute  Fever of 100F or higher    For urgent or emergent issues, a gastroenterologist can be reached at any hour by calling [phone].   DIET:  We do recommend a small meal at first, but then you may proceed to your regular diet.  Drink plenty of fluids but you should avoid alcoholic beverages for 24 hours.  ACTIVITY:  You should plan to take it easy for the rest of today and you should NOT DRIVE or use heavy machinery until tomorrow (because of the sedation medicines used during the test).     FOLLOW UP: Our staff will call the number listed on your records the next business day following your procedure to check on you and address any questions or concerns that you may have regarding the information given to you following your procedure. If we do not reach you, we will leave a message.  However, if you are feeling well and you are not experiencing any problems, there is no need to return our call.  We will assume that you have returned to your regular daily activities without incident.  If any biopsies were taken you will be contacted by phone or by letter within the next 1-3 weeks.  Please call us at [phone] if you have not heard about the biopsies in 3 weeks.    SIGNATURES/CONFIDENTIALITY: You and/or your care partner have signed paperwork which will be entered into your electronic medical record.  These signatures attest to the fact that that the information above on your After Visit Summary has been reviewed and is understood.  Full responsibility of the confidentiality of this discharge information lies with you and/or your care-partner.   Referral to [facility] for colonoscopy and polypectomy  Await pathology results on polyp removed  Information on diverticulosis given to you today

## 2016-01-21 NOTE — Progress Notes (Signed)
Called to room to assist during endoscopic procedure.  Patient ID and intended procedure confirmed with present staff. Received instructions for my participation in the procedure from the performing physician.  

## 2016-01-21 NOTE — Telephone Encounter (Signed)
Willie Dominguez,    Please work on a non-urgent referral to Dr Ivor Messier at [facility] for removal of complex cecal polyp. His assistant Nira Conn can be reached at [phone]  The patient has a copy of his report, but please be sure a copy of it is faxed along with the referral.  The patient is aware this will happen.  Let me know if any questions.

## 2016-01-22 ENCOUNTER — Telehealth: Payer: Self-pay

## 2016-01-22 ENCOUNTER — Ambulatory Visit
Admission: RE | Admit: 2016-01-22 | Discharge: 2016-01-22 | Disposition: A | Discharge status: Home / Self Care | Payer: 59 | Source: Ambulatory Visit | Attending: Orthopedic Surgery | Admitting: Orthopedic Surgery

## 2016-01-22 DIAGNOSIS — M75102 Unspecified rotator cuff tear or rupture of left shoulder, not specified as traumatic: Secondary | ICD-10-CM

## 2016-01-22 NOTE — Telephone Encounter (Signed)
Referral information faxed to [facility] attn: Dr. Ivor Messier. Copy of demographics/colonoscopy report sent.

## 2016-01-22 NOTE — Telephone Encounter (Signed)
  Follow up Call-  Call back number 01/21/2016  Post procedure Call Back phone  # [phone]  Permission to leave phone message Yes  Some recent data might be hidden    Patient was called for follow up after his procedure on 121/2017. I spoke to [facility] wife and she reports that [place] has returned to his normal daily activities without any complications.

## 2016-01-29 ENCOUNTER — Other Ambulatory Visit: Payer: Self-pay | Admitting: Neurology

## 2016-01-29 ENCOUNTER — Encounter: Payer: Self-pay | Admitting: Neurology

## 2016-01-29 ENCOUNTER — Ambulatory Visit (INDEPENDENT_AMBULATORY_CARE_PROVIDER_SITE_OTHER): Payer: 59 | Admitting: Neurology

## 2016-01-29 VITALS — BP 110/70 | HR 79 | Ht 71.0 in | Wt 242.6 lb

## 2016-01-29 DIAGNOSIS — G609 Hereditary and idiopathic neuropathy, unspecified: Secondary | ICD-10-CM | POA: Diagnosis not present

## 2016-01-29 NOTE — Patient Instructions (Addendum)
1.  You can try over the counter lidocaine ointment, such as Aspercream 2.  Check blood work.  We will call you with the results of the testing 3.  If you continue to have difficulty controlling foot pedals, please consider having hand controls installed  Return to clinic as needed

## 2016-01-29 NOTE — Progress Notes (Addendum)
[facility] Clinic Note - Initial Visit   Date: 01/29/16  Willie Dominguez MRN: [id number] DOB: 02-Jan-1951   Dear Dr. Burnice Logan:  Thank you for your kind referral of Willie Dominguez for consultation of neuropathy. Although her history is well known to you, please allow [place] to reiterate it for the purpose of our medical record. The patient was accompanied to the clinic by self.    History of Present Illness: Willie Dominguez is a 65 y.o. right-handed Caucasian male with hyperlipidemia, hypertension, BPH, congenital right eye blindness, and neuropathy presenting for second opinion of worsening neuropathy.    Starting around 2012, he began having numbness involving the toes with occasional sharp pain.  He does not have tingling.  His toes can burn and feel ice cold at the same time.  Over the years, his numbness has progressed to involve more of his feet.  He has difficultly with balance.  He has noticed problems with detecting the pedals while driving and is more cautious.  He denies any weakness and walks independently.  He was evaluated for these symptoms in 2015 by [name] who performed NCS/EMG which was normal and skin biopsy which was consistent with small fiber neuropathy. Laboratory testing was extensive and included vitamin B12, RPR, CRP, TSH, copper, HbA1c, vitamin B1, MMA, and SPEP with IFE which was normal.  He presents today with questions regarding underlying etiology and how to prevent progression of symptoms.  There is no history of diabetes or alcoholism.  No family history of neuropathy.   Out-side paper records, electronic medical record, and images have been reviewed where available and summarized as:  Lab Results  Component Value Date   TSH 3.21 08/20/2015   Lab Results  Component Value Date   [id number] 08/27/2015   Lab Results  Component Value Date   HGBA1C 5.4 11/20/2013   NCS/EMG of the legs 05/02/2013:  Normal Skin biopsy  07/26/2013:  Length dependent small fiber neuropathy   Past Medical History:  Diagnosis Date  . Arthritis   . 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 Cataract    right eye- traumatic cataract from puncture wound as a child  . Chronic kidney disease    kidney stone  . H/O benign prostatic hypertrophy    mild  . High blood pressure   . History of BPH   . Neuromuscular disorder (HCC)    numbness toes  . Numbness of toes   . Other and unspecified hyperlipidemia     with elevated lipoprotein (a)  . Personal history of urinary calculi   . Retention of urine, unspecified   . Thrombocytopenia ([place])   . Unspecified adverse effect of unspecified drug, medicinal and biological substance   . Unspecified essential hypertension   . Urticaria, unspecified     Past Surgical History:  Procedure Laterality Date  . COLONOSCOPY    . colonoscopy November 2007    . Inguinal herniorrhaphy  age 76    . LAPAROTOMY     age 58   . right foot tendon surgery Right 1984     Medications:  Outpatient Encounter Prescriptions as of 01/29/2016  Medication Sig Note  . aspirin 81 MG tablet Take 81 mg by mouth daily.     . Cholecalciferol (VITAMIN D3) 2000 units TABS Take 2 tablets by mouth daily.   . Coenzyme Q10 (CO Q-10) 200 MG CAPS Take 1 capsule by mouth daily.   [name]  Cyanocobalamin (B-12 PO) Take 1,000 mcg by mouth daily.    . DULoxetine (CYMBALTA) 30 MG capsule Take 1 capsule (30 mg total) by mouth daily.   [name] gabapentin (NEURONTIN) 100 MG capsule Take 1 capsule (100 mg total) by mouth 3 (three) times daily.   . meloxicam (MOBIC) 15 MG tablet Take 15 mg by mouth daily.   . Omega-3 Fatty Acids (FISH OIL) 1000 MG CAPS Take by mouth. 01/29/2016: Received from: [facility]: Take 1 capsule by mouth daily.  . Omega-3 Fatty Acids (FISH OIL) 1200 MG CAPS Take 2 capsules by mouth daily.   [name] oxyCODONE-acetaminophen (PERCOCET) 10-325 MG tablet Take 1 tablet by mouth every 6 (six)  hours as needed for pain.   . polyethylene glycol powder (MIRALAX) powder Take 1 Container by mouth once. 7 capfuls in 32 oz gatorade for colon 11-21- 2 day prep   . rosuvastatin (CRESTOR) 10 MG tablet Take 10 mg by mouth daily. 08/27/2015: Received from: External Pharmacy  . tamsulosin (FLOMAX) 0.4 MG CAPS capsule Take 1 capsule (0.4 mg total) by mouth daily.   . valsartan (DIOVAN) 80 MG tablet Take 80 mg by mouth daily. 08/27/2015: Received from: External Pharmacy   Facility-Administered Encounter Medications as of 01/29/2016  Medication  . 0.9 %  sodium chloride infusion     Allergies:  Allergies  Allergen Reactions  . Methotrexate Derivatives Other (See Comments)    Mouth ulcers  . Lipitor [Atorvastatin] Rash  . Bystolic [Nebivolol Hcl]   . Doxazosin     REACTION: aggrivated prostate, weakend stream, bladder pain and irritation  . Lisinopril     REACTION: Intolerance.  Depressive mood, fatigue  . Penicillins     REACTION: rash  . Simvastatin     REACTION: rash    Family History: Family History  Problem Relation Age of Onset  . COPD Mother   . Lung cancer Mother   . Diabetes Father   . Other Father     CABG  . Heart Problems Father   . Coronary artery disease Maternal Aunt   . Colon cancer Neg Hx   . Colon polyps Neg Hx   . Esophageal cancer Neg Hx   . Rectal cancer Neg Hx   . Stomach cancer Neg Hx     Social History: Social History  Substance Use Topics  . Smoking status: Never Smoker  . Smokeless tobacco: Never Used  . Alcohol use No   Social History   Social History Narrative   Married lives at home with his wife (carrie)    self employed.   College education   Right handed   Caffeine three cokes daily                   Review of Systems:  CONSTITUTIONAL: No fevers, chills, night sweats, or weight loss.   EYES: No visual changes or eye pain ENT: No hearing changes.  No history of nose bleeds.   RESPIRATORY: No cough, wheezing and shortness  of breath.   CARDIOVASCULAR: Negative for chest pain, and palpitations.   GI: Negative for abdominal discomfort, blood in stools or black stools.  No recent change in bowel habits.   GU:  No history of incontinence.   MUSCLOSKELETAL: No history of joint pain or swelling.  No myalgias.   SKIN: Negative for lesions, rash, and itching.   HEMATOLOGY/ONCOLOGY: Negative for prolonged bleeding, bruising easily, and swollen nodes.  No history of cancer.   ENDOCRINE: Negative for  cold or heat intolerance, polydipsia or goiter.   PSYCH:  No depression or anxiety symptoms.   NEURO: As Above.   Vital Signs:  BP 110/70   Pulse 79   Ht 5\' 11"  (1.803 m)   Wt 242 lb 9 oz (110 kg)   SpO2 96%   BMI 33.83 kg/m  Pain Scale: 0 on a scale of 0-10   General Medical Exam:   General:  Well appearing, comfortable.   Eyes/ENT: see cranial nerve examination.   Neck: No masses appreciated.  Full range of motion without tenderness.  No carotid bruits. Respiratory:  Clear to auscultation, good air entry bilaterally.   Cardiac:  Regular rate and rhythm, no murmur.   Extremities:  No deformities, edema, or skin discoloration.  Skin:  No rashes or lesions.  Neurological Exam: MENTAL STATUS including orientation to time, place, person, recent and remote memory, attention span and concentration, language, and fund of knowledge is normal.  Speech is not dysarthric.  CRANIAL NERVES: II:  No visual field defects on the left.  Unremarkable fundi on the left.  He is blind in the right eye with perception to light only (congenital).   III-IV-VI: Pupils equal round and reactive to light.  Normal conjugate, extra-ocular eye movements in all directions of gaze.  No nystagmus.  No ptosis.   V:  Normal facial sensation.    VII:  Normal facial symmetry and movements. VIII:  Normal hearing and vestibular function.   IX-X:  Normal palatal movement.   XI:  Normal shoulder shrug and head rotation.   XII:  Normal tongue  strength and range of motion, no deviation or fasciculation.  MOTOR:  No atrophy, fasciculations or abnormal movements.  No pronator drift.  Tone is normal.    Right Upper Extremity:    Left Upper Extremity:    Deltoid  5/5   Deltoid  5/5   Biceps  5/5   Biceps  5/5   Triceps  5/5   Triceps  5/5   Wrist extensors  5/5   Wrist extensors  5/5   Wrist flexors  5/5   Wrist flexors  5/5   Finger extensors  5/5   Finger extensors  5/5   Finger flexors  5/5   Finger flexors  5/5   Dorsal interossei  5/5   Dorsal interossei  5/5   Abductor pollicis  5/5   Abductor pollicis  5/5   Tone (Ashworth scale)  0  Tone (Ashworth scale)  0   Right Lower Extremity:    Left Lower Extremity:    Hip flexors  5/5   Hip flexors  5/5   Hip extensors  5/5   Hip extensors  5/5   Knee flexors  5/5   Knee flexors  5/5   Knee extensors  5/5   Knee extensors  5/5   Dorsiflexors  5/5   Dorsiflexors  5/5   Plantarflexors  5/5   Plantarflexors  5/5   Toe extensors  5/5   Toe extensors  5/5   Toe flexors  5/5   Toe flexors  5/5   Tone (Ashworth scale)  0  Tone (Ashworth scale)  0   MSRs:  Right  Left brachioradialis 2+  brachioradialis 2+  biceps 2+  biceps 2+  triceps 2+  triceps 2+  patellar 2+  patellar 2+  ankle jerk 1+  ankle jerk 1+  Hoffman no  Hoffman no  plantar response down  plantar response down   SENSORY:  Vibration is reduced at ankles and absent at great toe bilaterally.  Temperature is reduced in a gradient manner in the lower legs and feet.  There is hyperesthesia to pin prick in the feet.  Proprioception is normal.  Rhomberg testing is normal.  COORDINATION/GAIT: Normal finger-to- nose-finger and heel-to-shin.  Intact rapid alternating movements bilaterally.  Able to rise from a chair without using arms.  Gait narrow based and stable. Tandem and stressed gait intact.    IMPRESSION: Mr. Honan is a 65 year-old gentleman referred  for second opinion of bilateral feet paresthesias diagnosed as small fiber neuropathy in 2015.  His neurological examination shows a distal predominant small and large fiber peripheral neuropathy. I had extensive discussion with the patient regarding the pathogenesis, etiology, management, and natural course of neuropathy. Neuropathy tends to be slowly progressive, especially if a treatable etiology is not identified.  He had extensive lab testing previously and I will check a few additional labs, but unfortunately, in the vast majority of cases, despite checking for reversible causes, we are unable to find the underlying etiology and management is symptomatic.  He has no family history of neuropathy, no history of alcoholism, or diabetes.  I suspect he has idiopathic peripheral neuropathy initially with small fiber involvement, but now also with signs of large fiber loss.    PLAN/RECOMMENDATIONS:  1.  Check heavy metal screen, 2hr glucose tolerance, SSA/B, vitamin B6 2.  OTC lidocaine ointments can be used for painful paresthesias.  He is unable to tolerate gabapentin >300mg /d due to cognitive side effects. 3.  Driving safety discussed, especially if it is becoming difficult to control foot pedal and hand controls were recommended 4.  Fall precautions discussed  Return to clinic as needed   The duration of this appointment visit was 50 minutes of face-to-face time with the patient.  Greater than 50% of this time was spent in counseling, explanation of diagnosis, planning of further management, and coordination of care.   Thank you for allowing me to participate in patient's care.  If I can answer any additional questions, I would be pleased to do so.    Sincerely,    Nivea Wojdyla K. Posey Pronto, DO

## 2016-01-31 ENCOUNTER — Ambulatory Visit: Payer: 59 | Admitting: Neurology

## 2016-01-31 LAB — VITAMIN B6

## 2016-01-31 LAB — HEAVY METALS, BLOOD
Arsenic: 6 ug/L (ref 2–23)
Lead, Blood: 1 ug/dL (ref 0–19)
Mercury: NOT DETECTED ug/L (ref 0.0–14.9)

## 2016-01-31 LAB — SJOGREN'S SYNDROME ANTIBODS(SSA + SSB)
ENA SSA (RO) Ab: <0.2 AI (ref 0.0–0.9)
ENA SSB (LA) Ab: <0.2 AI (ref 0.0–0.9)

## 2016-02-03 ENCOUNTER — Other Ambulatory Visit: Payer: 59

## 2016-02-03 DIAGNOSIS — G609 Hereditary and idiopathic neuropathy, unspecified: Secondary | ICD-10-CM

## 2016-02-03 DIAGNOSIS — O99810 Abnormal glucose complicating pregnancy: Secondary | ICD-10-CM

## 2016-02-03 LAB — GLUCOSE TOLERANCE, 2 HOURS
GLUCOSE, FASTING: 115 mg/dL — AB (ref 70–99)
Glucose, 1 Hour GTT: 181 mg/dL
Glucose, 2 hour: 148 mg/dL

## 2016-02-03 LAB — HEAVY METALS, BLOOD

## 2016-02-03 LAB — VITAMIN B6: Vitamin B6: 55.8 ug/L — ABNORMAL HIGH (ref 5.3–46.7)

## 2016-02-03 LAB — SJOGREN'S SYNDROME ANTIBODS(SSA + SSB)

## 2016-02-13 ENCOUNTER — Telehealth: Payer: Self-pay

## 2016-02-13 NOTE — Telephone Encounter (Signed)
Patient's colonoscopy and polypectomy is scheduled at [facility] with Dr. Stephanie Acre on 03/06/16 at 1:15.

## 2016-03-13 ENCOUNTER — Telehealth: Payer: Self-pay | Admitting: Gastroenterology

## 2016-03-13 NOTE — Telephone Encounter (Signed)
I rec'd a copy of [facility] (Dr Stephanie Acre) note to this patient dated 03/10/16.  The cecal polyp was removed and is a tubulovillous adenoma without dysplasia.  Dr Stephanie Acre recommends a repeat colonoscopy in 1 year.  The letter indicates Dr Stephanie Acre will recall him, but please put him in for 1 year recall with me in case he would prefer to do it here.

## 2016-03-16 NOTE — Telephone Encounter (Signed)
Recall for one year has been placed in EPIC.

## 2016-08-17 ENCOUNTER — Ambulatory Visit (INDEPENDENT_AMBULATORY_CARE_PROVIDER_SITE_OTHER): Payer: 59 | Admitting: Internal Medicine

## 2016-08-17 ENCOUNTER — Encounter: Payer: Self-pay | Admitting: Internal Medicine

## 2016-08-17 VITALS — BP 120/72 | HR 56 | Temp 97.6°F | Ht 71.0 in | Wt 245.4 lb

## 2016-08-17 DIAGNOSIS — I10 Essential (primary) hypertension: Secondary | ICD-10-CM

## 2016-08-17 DIAGNOSIS — M79604 Pain in right leg: Secondary | ICD-10-CM

## 2016-08-17 DIAGNOSIS — M549 Dorsalgia, unspecified: Secondary | ICD-10-CM

## 2016-08-17 DIAGNOSIS — G609 Hereditary and idiopathic neuropathy, unspecified: Secondary | ICD-10-CM

## 2016-08-17 DIAGNOSIS — W57XXXA Bitten or stung by nonvenomous insect and other nonvenomous arthropods, initial encounter: Secondary | ICD-10-CM

## 2016-08-17 DIAGNOSIS — E785 Hyperlipidemia, unspecified: Secondary | ICD-10-CM

## 2016-08-17 DIAGNOSIS — M79605 Pain in left leg: Secondary | ICD-10-CM

## 2016-08-17 LAB — CBC WITH DIFFERENTIAL/PLATELET
BASOS ABS: 0 10*3/uL (ref 0.0–0.1)
Basophils Relative: 0.4 % (ref 0.0–3.0)
Eosinophils Absolute: 0.1 10*3/uL (ref 0.0–0.7)
Eosinophils Relative: 3.5 % (ref 0.0–5.0)
HCT: 41.2 % (ref 39.0–52.0)
Hemoglobin: 13.9 g/dL (ref 13.0–17.0)
LYMPHS ABS: 1.4 10*3/uL (ref 0.7–4.0)
LYMPHS PCT: 38.2 % (ref 12.0–46.0)
MCHC: 33.6 g/dL (ref 30.0–36.0)
MCV: 91.6 fl (ref 78.0–100.0)
Monocytes Absolute: 0.3 10*3/uL (ref 0.1–1.0)
Monocytes Relative: 9.4 % (ref 3.0–12.0)
NEUTROS ABS: 1.7 10*3/uL (ref 1.4–7.7)
NEUTROS PCT: 48.5 % (ref 43.0–77.0)
PLATELETS: 140 10*3/uL — AB (ref 150.0–400.0)
RBC: 4.5 Mil/uL (ref 4.22–5.81)
RDW: 14.3 % (ref 11.5–15.5)
WBC: 3.6 10*3/uL — ABNORMAL LOW (ref 4.0–10.5)

## 2016-08-17 LAB — COMPREHENSIVE METABOLIC PANEL
ALBUMIN: 4.3 g/dL (ref 3.5–5.2)
ALK PHOS: 75 U/L (ref 39–117)
ALT: 33 U/L (ref 0–53)
AST: 26 U/L (ref 0–37)
BUN: 25 mg/dL — ABNORMAL HIGH (ref 6–23)
CO2: 26 mEq/L (ref 19–32)
Calcium: 9.2 mg/dL (ref 8.4–10.5)
Chloride: 108 mEq/L (ref 96–112)
Creatinine, Ser: 1.05 mg/dL (ref 0.40–1.50)
GFR: 75.21 mL/min (ref >60.00)
GLUCOSE: 115 mg/dL — AB (ref 70–99)
POTASSIUM: 4.1 meq/L (ref 3.5–5.1)
Sodium: 140 mEq/L (ref 135–145)
TOTAL PROTEIN: 6.6 g/dL (ref 6.0–8.3)
Total Bilirubin: 0.4 mg/dL (ref 0.2–1.2)

## 2016-08-17 LAB — SEDIMENTATION RATE: SED RATE: 13 mm/h (ref 0–20)

## 2016-08-17 LAB — TSH: TSH: 2.37 u[IU]/mL (ref 0.35–4.50)

## 2016-08-17 NOTE — Patient Instructions (Addendum)
WE NOW OFFER   [facility] FAST TRACK!!!  SAME DAY Appointments for ACUTE CARE  Such as: Sprains, Injuries, cuts, abrasions, rashes, muscle pain, joint pain, back pain Colds, flu, sore throats, headache, allergies, cough, fever  Ear pain, sinus and eye infections Abdominal pain, nausea, vomiting, diarrhea, upset stomach Animal/insect bites  3 Easy Ways to Schedule: Walk-In Scheduling Call in scheduling Mychart Sign-up: [email or web address]   Follow-up with neurology  Limit your sodium (Salt) intake  Please check your blood pressure on a regular basis.  If it is consistently greater than 150/90, please make an office appointment.  Report any new or worsening symptoms

## 2016-08-17 NOTE — Progress Notes (Signed)
Subjective:    Patient ID: Willie Dominguez, male    DOB: Aug 28, 1950, 66 y.o.   MRN: [id number]  HPI  66 year old patient who is followed by neurology with a history of peripheral neuropathy. For the past month, he has had some low back pain that has been associated with leg discomfort.  He states that his legs feel fairly normal when he first gets up in the morning, but describes a constant ache throughout the day.  He also describes some right hip pain.  He states that there is tenderness over the right hip and anterior thigh areas He complains of some night sweats but otherwise no constitutional complaints.  He has been using Tylenol and Advil He also complains of decrease coordination involving the hands and a tendency to drop objects No major change in bowel or bladder habits, but he does states that he has had decreased urinary flow for some time.  He states that he has diarrhea once per week, but no incontinence  History of tick bite exposure 2 months ago.  Patient concerned about Lyme disease  Past Medical History:  Diagnosis Date  . Arthritis   . 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 Cataract    right eye- traumatic cataract from puncture wound as a child  . Chronic kidney disease    kidney stone  . H/O benign prostatic hypertrophy    mild  . High blood pressure   . History of BPH   . Neuromuscular disorder (HCC)    numbness toes  . Numbness of toes   . Other and unspecified hyperlipidemia     with elevated lipoprotein (a)  . Personal history of urinary calculi   . Retention of urine, unspecified   . Thrombocytopenia ([place])   . Unspecified adverse effect of unspecified drug, medicinal and biological substance   . Unspecified essential hypertension   . Urticaria, unspecified      Social History   Social History  . Marital status: Married    Spouse name: [name]  . Number of children: 2  . Years of education: college   Occupational History  .  Pension scheme manager Employed   Social History Main Topics  . Smoking status: Never Smoker  . Smokeless tobacco: Never Used  . Alcohol use No  . Drug use: No  . Sexual activity: Not on file   Other Topics Concern  . Not on file   Social History Narrative   Married lives at home with his wife (carrie)    self employed.   College education   Right handed   Caffeine three cokes daily                   Past Surgical History:  Procedure Laterality Date  . COLONOSCOPY    . colonoscopy November 2007    . Inguinal herniorrhaphy  age 53    . LAPAROTOMY     age 51   . right foot tendon surgery Right 1984    Family History  Problem Relation Age of Onset  . COPD Mother   . Lung cancer Mother   . Diabetes Father   . Other Father        CABG  . Heart Problems Father   . Coronary artery disease Maternal Aunt   . Heart disease Brother   . Colon cancer Neg Hx   . Colon polyps Neg Hx   . Esophageal cancer Neg Hx   .  Rectal cancer Neg Hx   . Stomach cancer Neg Hx     Allergies  Allergen Reactions  . Methotrexate Derivatives Other (See Comments)    Mouth ulcers  . Lipitor [Atorvastatin] Rash  . Bystolic [Nebivolol Hcl]   . Doxazosin     REACTION: aggrivated prostate, weakend stream, bladder pain and irritation  . Lisinopril     REACTION: Intolerance.  Depressive mood, fatigue  . Penicillins     REACTION: rash  . Simvastatin     REACTION: rash    Current Outpatient Prescriptions on File Prior to Visit  Medication Sig Dispense Refill  . aspirin 81 MG tablet Take 81 mg by mouth daily.      . Cholecalciferol (VITAMIN D3) 2000 units TABS Take 2 tablets by mouth daily.    . Coenzyme Q10 (CO Q-10) 200 MG CAPS Take 1 capsule by mouth daily.    . Cyanocobalamin (B-12 PO) Take 1,000 mcg by mouth daily.     [name] gabapentin (NEURONTIN) 100 MG capsule Take 1 capsule (100 mg total) by mouth 3 (three) times daily. 90 capsule 3  . Omega-3 Fatty Acids (FISH OIL) 1200 MG CAPS  Take 2 capsules by mouth daily.    . rosuvastatin (CRESTOR) 10 MG tablet Take 10 mg by mouth daily.  2  . valsartan (DIOVAN) 80 MG tablet Take 80 mg by mouth daily.  2   Current Facility-Administered Medications on File Prior to Visit  Medication Dose Route Frequency Provider Last Rate Last Dose  . 0.9 %  sodium chloride infusion  500 mL Intravenous Continuous Danis, Estill Cotta III, MD        BP 120/72 (BP Location: Left Arm, Patient Position: Sitting, Cuff Size: Normal)   Pulse (!) 56   Temp 97.6 F (36.4 C) (Oral)   Ht 5\' 11"  (1.803 m)   Wt 245 lb 6.4 oz (111.3 kg)   SpO2 97%   BMI 34.23 kg/m     Review of Systems  Constitutional: Negative for appetite change, chills, fatigue and fever.  HENT: Negative for congestion, dental problem, ear pain, hearing loss, sore throat, tinnitus, trouble swallowing and voice change.   Eyes: Negative for pain, discharge and visual disturbance.  Respiratory: Negative for cough, chest tightness, wheezing and stridor.   Cardiovascular: Negative for chest pain, palpitations and leg swelling.  Gastrointestinal: Negative for abdominal distention, abdominal pain, blood in stool, constipation, diarrhea, nausea and vomiting.  Genitourinary: Negative for difficulty urinating, discharge, flank pain, genital sores, hematuria and urgency.  Musculoskeletal: Positive for back pain. Negative for arthralgias, gait problem, joint swelling, myalgias and neck stiffness.  Skin: Negative for rash.  Neurological: Positive for numbness. Negative for dizziness, syncope, speech difficulty, weakness and headaches.  Hematological: Negative for adenopathy. Does not bruise/bleed easily.  Psychiatric/Behavioral: Negative for behavioral problems and dysphoric mood. The patient is not nervous/anxious.        Objective:   Physical Exam  Constitutional: He is oriented to person, place, and time. He appears well-developed.  HENT:  Head: Normocephalic.  Right Ear: External ear  normal.  Left Ear: External ear normal.  Eyes: Conjunctivae and EOM are normal.  Neck: Normal range of motion.  Cardiovascular: Normal rate and normal heart sounds.   Pulmonary/Chest: Breath sounds normal.  Abdominal: Bowel sounds are normal.  Musculoskeletal: Normal range of motion. He exhibits no edema or tenderness.  Straight leg test negative Straight leg test causes some pain in the anterior thighs.    Neurological: He is alert  and oriented to person, place, and time.  Cranial nerve examination-Anisacoria with the right pupil smaller Blind right eye Suggestion of slight blunting left nasal labial fold Motor examination normal Normal finger to nose testing No drift Gait appears normal.  Able to walk on toes and heels Reflexes somewhat blunted and symmetrical except for an increase right triceps reflex  Psychiatric: He has a normal mood and affect. His behavior is normal.          Assessment & Plan:   One month history of back and leg discomfort History of peripheral neuropathy Essential hypertension Dyslipidemia.  On statin therapy  We'll check screening lab including sedimentation rate Follow-up neurology  [name]

## 2016-08-18 LAB — HEPATITIS C ANTIBODY: HCV Ab: NEGATIVE

## 2016-08-20 LAB — B. BURGDORFI ANTIBODIES

## 2016-09-15 ENCOUNTER — Ambulatory Visit: Payer: 59 | Admitting: Internal Medicine

## 2016-09-25 ENCOUNTER — Ambulatory Visit (INDEPENDENT_AMBULATORY_CARE_PROVIDER_SITE_OTHER): Payer: 59 | Admitting: Neurology

## 2016-09-25 ENCOUNTER — Encounter: Payer: Self-pay | Admitting: Neurology

## 2016-09-25 VITALS — BP 118/80 | HR 61 | Ht 71.0 in | Wt 242.4 lb

## 2016-09-25 DIAGNOSIS — M791 Myalgia, unspecified site: Secondary | ICD-10-CM

## 2016-09-25 DIAGNOSIS — G609 Hereditary and idiopathic neuropathy, unspecified: Secondary | ICD-10-CM

## 2016-09-25 MED ORDER — PREGABALIN 100 MG PO CAPS
100.0000 mg | ORAL_CAPSULE | Freq: Two times a day (BID) | ORAL | 5 refills | Status: DC
Start: 2016-09-25 — End: 2017-01-19

## 2016-09-25 MED ORDER — PREGABALIN 50 MG PO CAPS: 50.0000 mg | ORAL_CAPSULE | Freq: Three times a day (TID) | ORAL | 0 refills | Status: DC

## 2016-09-25 MED ORDER — PREGABALIN 75 MG PO CAPS: 75.0000 mg | ORAL_CAPSULE | Freq: Two times a day (BID) | ORAL | 0 refills | Status: DC

## 2016-09-25 NOTE — Patient Instructions (Addendum)
Start Lyrica as follows:    Morning       Evening  Week 1 50mg   50mg            Week 2 75 mg  75mg   Continue  100mg   100mg    Call with update in 1 month, to determine further increase in medication.  If you develop increased sleepiness, stay at the lower dose.           Discuss with your cardiologist for a drug holiday off crestor to see if your leg pain is due to statin-induced myalgias.  Follow-up with your primary care doctor about your reduced urine output  Return to clinic in 4 months

## 2016-09-25 NOTE — Progress Notes (Signed)
Follow-up Visit   Date: 09/25/16    Willie Dominguez MRN: [id number] DOB: Nov 30, 1950   Interim History: Willie Dominguez is a 66 y.o.  right-handed Caucasian male with hyperlipidemia, hypertension, BPH, congenital right eye blindness, and neuropathy returning to the clinic for follow-up of neuropathy and leg pain.    History of present illness: Starting around 2012, he began having numbness involving the toes with occasional sharp pain.  He does not have tingling.  His toes can burn and feel ice cold at the same time.  Over the years, his numbness has progressed to involve more of his feet.  He has difficultly with balance.  He has noticed problems with detecting the pedals while driving and is more cautious.  He denies any weakness and walks independently.  He was evaluated for these symptoms in 2015 by [name] who performed NCS/EMG which was normal and skin biopsy which was consistent with small fiber neuropathy. Laboratory testing was extensive and included vitamin B12, RPR, CRP, TSH, copper, HbA1c, vitamin B1, MMA, and SPEP with IFE which was normal.  He presents today with questions regarding underlying etiology and how to prevent progression of symptoms.  There is no history of diabetes or alcoholism.  No family history of neuropathy.   UPDATE 09/25/2016:  Over the past 6 months, he has noticed progressive burning pain of the feet.  He takes gabapentin 369m BID and was unable to tolerate higher dose due to side effects. He self-tapered gabapentin because of no benefit.    He also complains of achy bone pain which starts in his low back and involves his legs, like a "toothache".  He also complains of tenderness of the muscles/bones which is present at rest or with activity.  He denies any weakness of the legs. He denies radicular pain or paresthesias. This pain is constant and worse at night.  He tried NSAIDs which helps.  He also has noticed reduced urination and waking up in sweats at  night.  He denies urinary incontinence, but has reduced output.  ESR is normal.    Lab Results  Component Value Date   TSH 2.37 08/17/2016    Medications:  Current Outpatient Prescriptions on File Prior to Visit  Medication Sig Dispense Refill  . aspirin 81 MG tablet Take 81 mg by mouth daily.      . Cholecalciferol (VITAMIN D3) 2000 units TABS Take 2 tablets by mouth daily.    . Coenzyme Q10 (CO Q-10) 200 MG CAPS Take 1 capsule by mouth daily.    . Cyanocobalamin (B-12 PO) Take 1,000 mcg by mouth daily.     . Omega-3 Fatty Acids (FISH OIL) 1200 MG CAPS Take 2 capsules by mouth daily.    . rosuvastatin (CRESTOR) 10 MG tablet Take 10 mg by mouth daily.  2  . valsartan (DIOVAN) 80 MG tablet Take 80 mg by mouth daily.  2  . gabapentin (NEURONTIN) 100 MG capsule Take 1 capsule (100 mg total) by mouth 3 (three) times daily. (Patient not taking: Reported on 09/25/2016) 90 capsule 3   Current Facility-Administered Medications on File Prior to Visit  Medication Dose Route Frequency Provider Last Rate Last Dose  . 0.9 %  sodium chloride infusion  500 mL Intravenous Continuous DDoran Stabler MD        Allergies:  Allergies  Allergen Reactions  . Methotrexate Derivatives Other (See Comments)    Mouth ulcers  . Lipitor [Atorvastatin] Rash  . Bystolic [  Nebivolol Hcl]   . Doxazosin     REACTION: aggrivated prostate, weakend stream, bladder pain and irritation  . Lisinopril     REACTION: Intolerance.  Depressive mood, fatigue  . Penicillins     REACTION: rash  . Simvastatin     REACTION: rash    Review of Systems:  CONSTITUTIONAL: No fevers, chills, night sweats, or weight loss.  EYES: No visual changes or eye pain ENT: No hearing changes.  No history of nose bleeds.   RESPIRATORY: No cough, wheezing and shortness of breath.   CARDIOVASCULAR: Negative for chest pain, and palpitations.   GI: Negative for abdominal discomfort, blood in stools or black stools.  No recent change in  bowel habits.   GU:  No history of incontinence.   MUSCLOSKELETAL: No history of joint pain or swelling.  No myalgias.   SKIN: Negative for lesions, rash, and itching.   ENDOCRINE: Negative for cold or heat intolerance, polydipsia or goiter.   PSYCH:  No depression +anxiety symptoms.   NEURO: As Above.   Vital Signs:  BP 118/80   Pulse 61   Ht _0  (1.803 m)   Wt 242 lb 7 oz (110 kg)   SpO2 96%   BMI 33.81 kg/m   Neurological Exam: MENTAL STATUS including orientation to time, place, person, recent and remote memory, attention span and concentration, language, and fund of knowledge is normal.  Speech is not dysarthric.  CRANIAL NERVES: Pupils equal round and reactive to light.  Normal conjugate, extra-ocular eye movements in all directions of gaze.  No ptosis.  Face is symmetric. Palate elevates symmetrically.  Tongue is midline.  MOTOR:  Motor strength is 5/5 in all extremities, including hip flexion.  There is mild muscle tenderness to deep palpation over the anterior thigh.   No atrophy, fasciculations or abnormal movements.  No pronator drift.  Tone is normal.    MSRs:  Reflexes are 2+/4 throughout, except 1+ at the Achilles bilaterally.  Plantars are down going.   SENSORY:  Vibration is reduced at ankles and trace at great toe bilaterally.  Pin prick and temperature is intact.  COORDINATION/GAIT:   Gait narrow based and stable.   Data: NCS/EMG of the legs 05/02/2013:  Normal Skin biopsy 07/26/2013:  Length dependent small fiber neuropathy  Labs 01/29/2016:  Heavy metal screen neg, SSA/B neg, vitamin B6 55.4*, 2hGTT [phone]*  IMPRESSION/PLAN: Willie Dominguez is a 66 year-old gentleman returning for follow-up of bilateral feet paresthesias diagnosed as small fiber neuropathy in 2015.  At his last visit, he was checked for treatable causes of neuropathy, including glucose tolerance testing which returned abnormal, consistent with impaired glucose tolerance.  I discussed that  maintaining blood sugars is important to minimize progression of symptoms.  He was tried on gabapentin, but did not tolerate therapeutic dose due to sedation, so will be switched to Lyrica.   Regarding his leg pain, this does not have characteristic neuropathic features and is more suggestive of musculoskeletal pain. There is no weakness, radicular pain, or paresthesias.  With him being on Crestor, statin-induced myalgias is possible, but he has been on this for a number of years and is on a low dose.  Nevertheless, it would be reasonable to offer a drug holiday.   Patient had many questions which I answered to the best of my ability.  He is understandably frustrated at the lack of effective treatment for his neuropathy.  PLAN/RECOMMENDATIONS:  1.  Start Lyrica 15m BID x 1 week,  then increase 31m BID x 1 weeks, then 109mBID 2.  Recommend briefly holding Crestor, if okay by his provider, to determine if leg pain is due to statin-induced myalgias. 3.  Follow-up with PCP regarding reduced urine output  Return to clinic in 4 months   The duration of this appointment visit was 30 minutes of face-to-face time with the patient.  Greater than 50% of this time was spent in counseling, explanation of diagnosis, planning of further management, and coordination of care.   Thank you for allowing me to participate in patient's care.  If I can answer any additional questions, I would be pleased to do so.    Sincerely,    Craige Stevens Magwood K. PaPosey ProntoDO

## 2016-09-28 ENCOUNTER — Ambulatory Visit (INDEPENDENT_AMBULATORY_CARE_PROVIDER_SITE_OTHER): Payer: 59 | Admitting: Internal Medicine

## 2016-09-28 ENCOUNTER — Encounter: Payer: Self-pay | Admitting: Internal Medicine

## 2016-09-28 VITALS — BP 120/72 | HR 66 | Temp 98.1°F | Ht 71.0 in | Wt 240.8 lb

## 2016-09-28 DIAGNOSIS — G609 Hereditary and idiopathic neuropathy, unspecified: Secondary | ICD-10-CM | POA: Diagnosis not present

## 2016-09-28 DIAGNOSIS — R0609 Other forms of dyspnea: Secondary | ICD-10-CM

## 2016-09-28 DIAGNOSIS — I10 Essential (primary) hypertension: Secondary | ICD-10-CM

## 2016-09-28 DIAGNOSIS — Z8249 Family history of ischemic heart disease and other diseases of the circulatory system: Secondary | ICD-10-CM | POA: Diagnosis not present

## 2016-09-28 DIAGNOSIS — R7302 Impaired glucose tolerance (oral): Secondary | ICD-10-CM | POA: Diagnosis not present

## 2016-09-28 LAB — POCT GLYCOSYLATED HEMOGLOBIN (HGB A1C): Hemoglobin A1C: 5.3

## 2016-09-28 MED ORDER — METFORMIN HCL ER 500 MG PO TB24: 500.0000 mg | ORAL_TABLET | Freq: Every day | ORAL | 3 refills | Status: DC

## 2016-09-28 MED ORDER — TAMSULOSIN HCL 0.4 MG PO CAPS
0.4000 mg | ORAL_CAPSULE | Freq: Every day | ORAL | 3 refills | Status: DC
Start: 2016-09-28 — End: 2018-02-18

## 2016-09-28 NOTE — Addendum Note (Signed)
Addended by: Wyvonne Lenz on: 09/28/2016 03:40 PM   Modules accepted: Orders

## 2016-09-28 NOTE — Patient Instructions (Addendum)
  Hold Crestor at this time  Cardiac stress testing as discussed  Return in 3 months for follow-up  Start Lyrica as prescribed

## 2016-09-28 NOTE — Progress Notes (Signed)
Subjective:    Patient ID: Willie Dominguez, male    DOB: 1950/08/12, 66 y.o.   MRN: [id number]  HPI  66 year old patient who has a history of a severe peripheral neuropathy. He has been seen by neurology recently and it was suggested he give himself a trial of Lyrica. The patient continues to have some leg pain with exertion.  The patient also went on a hike over the weekend and had some dyspnea on exertion.  This has been a complaint in the past.  He does have a family history of premature coronary artery disease with his father affected.  He has had at least 3 nuclear stress tests over the years.  He denies any exertional chest pain He does have a history of mild impaired glucose tolerance.  Hemoglobin A1c today 5.2  For the past 2 or 3 months.  She continues to have some night sweats fairly predictably at 3:00 in the morning.  Additionally, he complains of some obstructive urinary symptoms.  He does have a history of allergy to doxazosin but has been treated with Flomax in the past for a renal stone which he tolerated well  Past Medical History:  Diagnosis Date  . Arthritis   . 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 Cataract    right eye- traumatic cataract from puncture wound as a child  . Chronic kidney disease    kidney stone  . H/O benign prostatic hypertrophy    mild  . High blood pressure   . History of BPH   . Neuromuscular disorder (HCC)    numbness toes  . Numbness of toes   . Other and unspecified hyperlipidemia     with elevated lipoprotein (a)  . Personal history of urinary calculi   . Retention of urine, unspecified   . Thrombocytopenia ([place])   . Unspecified adverse effect of unspecified drug, medicinal and biological substance   . Unspecified essential hypertension   . Urticaria, unspecified      Social History   Social History  . Marital status: Married    Spouse name: [name]  . Number of children: 2  . Years of education: college    Occupational History  . Pension scheme manager Employed   Social History Main Topics  . Smoking status: Never Smoker  . Smokeless tobacco: Never Used  . Alcohol use No  . Drug use: No  . Sexual activity: Not on file   Other Topics Concern  . Not on file   Social History Narrative   Married lives at home with his wife (carrie)    self employed.   College education   Right handed   Caffeine three cokes daily                   Past Surgical History:  Procedure Laterality Date  . COLONOSCOPY    . colonoscopy November 2007    . Inguinal herniorrhaphy  age 48    . LAPAROTOMY     age 81   . right foot tendon surgery Right 1984    Family History  Problem Relation Age of Onset  . COPD Mother   . Lung cancer Mother   . Diabetes Father   . Other Father        CABG  . Heart Problems Father   . Coronary artery disease Maternal Aunt   . Heart disease Brother   . Colon cancer Neg Hx   .  Colon polyps Neg Hx   . Esophageal cancer Neg Hx   . Rectal cancer Neg Hx   . Stomach cancer Neg Hx     Allergies  Allergen Reactions  . Methotrexate Derivatives Other (See Comments)    Mouth ulcers  . Lipitor [Atorvastatin] Rash  . Bystolic [Nebivolol Hcl]   . Doxazosin     REACTION: aggrivated prostate, weakend stream, bladder pain and irritation  . Lisinopril     REACTION: Intolerance.  Depressive mood, fatigue  . Penicillins     REACTION: rash  . Simvastatin     REACTION: rash    Current Outpatient Prescriptions on File Prior to Visit  Medication Sig Dispense Refill  . aspirin 81 MG tablet Take 81 mg by mouth daily.      . Cholecalciferol (VITAMIN D3) 2000 units TABS Take 2 tablets by mouth daily.    . Coenzyme Q10 (CO Q-10) 200 MG CAPS Take 1 capsule by mouth daily.    . Cyanocobalamin (B-12 PO) Take 1,000 mcg by mouth daily.     . Omega-3 Fatty Acids (FISH OIL) 1200 MG CAPS Take 2 capsules by mouth daily.    . rosuvastatin (CRESTOR) 10 MG tablet Take 10 mg by  mouth daily.  2  . valsartan (DIOVAN) 80 MG tablet Take 80 mg by mouth daily.  2  . pregabalin (LYRICA) 100 MG capsule Take 1 capsule (100 mg total) by mouth 2 (two) times daily. (Patient not taking: Reported on 09/28/2016) 60 capsule 5  . pregabalin (LYRICA) 50 MG capsule Take 1 capsule (50 mg total) by mouth 3 (three) times daily. (Patient not taking: Reported on 09/28/2016) 21 capsule 0  . pregabalin (LYRICA) 75 MG capsule Take 1 capsule (75 mg total) by mouth 2 (two) times daily. (Patient not taking: Reported on 09/28/2016) 14 capsule 0   Current Facility-Administered Medications on File Prior to Visit  Medication Dose Route Frequency Provider Last Rate Last Dose  . 0.9 %  sodium chloride infusion  500 mL Intravenous Continuous Danis, Estill Cotta III, MD        BP 120/72 (BP Location: Left Arm, Patient Position: Sitting, Cuff Size: Normal)   Pulse 66   Temp 98.1 F (36.7 C) (Oral)   Ht 5\' 11"  (1.803 m)   Wt 240 lb 12.8 oz (109.2 kg)   SpO2 98%   BMI 33.58 kg/m     Review of Systems  Constitutional: Positive for diaphoresis. Negative for appetite change, chills, fatigue and fever.  HENT: Negative for congestion, dental problem, ear pain, hearing loss, sore throat, tinnitus, trouble swallowing and voice change.   Eyes: Negative for pain, discharge and visual disturbance.  Respiratory: Positive for shortness of breath. Negative for cough, chest tightness, wheezing and stridor.   Cardiovascular: Negative for chest pain, palpitations and leg swelling.  Gastrointestinal: Negative for abdominal distention, abdominal pain, blood in stool, constipation, diarrhea, nausea and vomiting.  Genitourinary: Positive for decreased urine volume and difficulty urinating. Negative for discharge, flank pain, genital sores, hematuria and urgency.  Musculoskeletal: Positive for myalgias. Negative for arthralgias, back pain, gait problem, joint swelling and neck stiffness.  Skin: Negative for rash.   Neurological: Negative for dizziness, syncope, speech difficulty, weakness, numbness and headaches.  Hematological: Negative for adenopathy. Does not bruise/bleed easily.  Psychiatric/Behavioral: Negative for behavioral problems and dysphoric mood. The patient is not nervous/anxious.        Objective:   Physical Exam  Constitutional: He is oriented to person, place, and time.  He appears well-developed.  HENT:  Head: Normocephalic.  Right Ear: External ear normal.  Left Ear: External ear normal.  Eyes: Conjunctivae and EOM are normal.  Neck: Normal range of motion.  Cardiovascular: Normal rate, normal heart sounds and intact distal pulses.   Pedal pulses are full  Pulmonary/Chest: Breath sounds normal.  Abdominal: Bowel sounds are normal.  Musculoskeletal: Normal range of motion. He exhibits no edema or tenderness.  Neurological: He is alert and oriented to person, place, and time.  Psychiatric: He has a normal mood and affect. His behavior is normal.          Assessment & Plan:   Exertional leg pain.  Sounds more musculoligamentous.  Pedal pulses are full.  Patient's father had CAD, as well as advanced PAD but was a heavy smoker. Mild dyslipidemia.  Will give a trial off Crestor BPH.  Areas symptomatic trial of Flomax Peripheral neuropathy.  Patient encouraged a trial of Lyrica as recommended by neurology Impaired glucose tolerance.  Hemoglobin A1c 5.2.  Will place on submaximal dose metformin therapy Dyspnea on exertion/amily history of premature CAD.  Will schedule a nuclear stress test for risk stratification.  The patient is asked to return in  3  months with lipid profile

## 2016-09-30 ENCOUNTER — Telehealth (HOSPITAL_COMMUNITY): Payer: Self-pay

## 2016-09-30 NOTE — Telephone Encounter (Signed)
Encounter complete. 

## 2016-10-01 ENCOUNTER — Telehealth (HOSPITAL_COMMUNITY): Payer: Self-pay

## 2016-10-01 NOTE — Telephone Encounter (Signed)
Encounter complete. 

## 2016-10-02 ENCOUNTER — Ambulatory Visit (HOSPITAL_COMMUNITY)
Admission: RE | Admit: 2016-10-02 | Discharge: 2016-10-02 | Disposition: A | Discharge status: Home / Self Care | Payer: 59 | Source: Ambulatory Visit | Attending: Cardiovascular Disease | Admitting: Cardiovascular Disease

## 2016-10-02 DIAGNOSIS — Z8249 Family history of ischemic heart disease and other diseases of the circulatory system: Secondary | ICD-10-CM | POA: Diagnosis not present

## 2016-10-02 DIAGNOSIS — E7439 Other disorders of intestinal carbohydrate absorption: Secondary | ICD-10-CM | POA: Diagnosis not present

## 2016-10-02 DIAGNOSIS — R9439 Abnormal result of other cardiovascular function study: Secondary | ICD-10-CM | POA: Insufficient documentation

## 2016-10-02 DIAGNOSIS — Z6833 Body mass index (BMI) 33.0-33.9, adult: Secondary | ICD-10-CM | POA: Diagnosis not present

## 2016-10-02 DIAGNOSIS — R42 Dizziness and giddiness: Secondary | ICD-10-CM | POA: Diagnosis not present

## 2016-10-02 DIAGNOSIS — I119 Hypertensive heart disease without heart failure: Secondary | ICD-10-CM | POA: Diagnosis not present

## 2016-10-02 DIAGNOSIS — E663 Overweight: Secondary | ICD-10-CM | POA: Diagnosis not present

## 2016-10-02 DIAGNOSIS — R0609 Other forms of dyspnea: Secondary | ICD-10-CM | POA: Insufficient documentation

## 2016-10-02 DIAGNOSIS — R61 Generalized hyperhidrosis: Secondary | ICD-10-CM | POA: Diagnosis not present

## 2016-10-02 DIAGNOSIS — R5383 Other fatigue: Secondary | ICD-10-CM | POA: Diagnosis not present

## 2016-10-02 DIAGNOSIS — G629 Polyneuropathy, unspecified: Secondary | ICD-10-CM | POA: Diagnosis not present

## 2016-10-02 LAB — MYOCARDIAL PERFUSION IMAGING
CHL CUP NUCLEAR SRS: 2
CSEPEDS: 30 s
CSEPEW: 9.9 METS
CSEPPHR: 141 {beats}/min
Exercise duration (min): 8 min
LVDIAVOL: 130 mL (ref 62–150)
LVSYSVOL: 62 mL
MPHR: 155 {beats}/min
NUC STRESS TID: 1.03
Percent HR: 90 %
RPE: 18
Rest HR: 62 {beats}/min
SDS: 2
SSS: 4

## 2016-10-02 MED ORDER — TECHNETIUM TC 99M TETROFOSMIN IV KIT
10.3000 | PACK | Freq: Once | INTRAVENOUS | Status: AC | PRN
  Administered 2016-10-02: 10.3 via INTRAVENOUS
  Filled 2016-10-02: qty 11

## 2016-10-02 MED ORDER — TECHNETIUM TC 99M TETROFOSMIN IV KIT
31.8000 | PACK | Freq: Once | INTRAVENOUS | Status: AC | PRN
  Administered 2016-10-02: 31.8 via INTRAVENOUS
  Filled 2016-10-02: qty 32

## 2016-12-29 ENCOUNTER — Encounter: Payer: Self-pay | Admitting: Internal Medicine

## 2016-12-29 ENCOUNTER — Ambulatory Visit (INDEPENDENT_AMBULATORY_CARE_PROVIDER_SITE_OTHER): Payer: 59 | Admitting: Internal Medicine

## 2016-12-29 VITALS — BP 112/68 | HR 65 | Temp 98.0°F | Ht 71.0 in | Wt 241.4 lb

## 2016-12-29 DIAGNOSIS — I10 Essential (primary) hypertension: Secondary | ICD-10-CM | POA: Diagnosis not present

## 2016-12-29 DIAGNOSIS — E785 Hyperlipidemia, unspecified: Secondary | ICD-10-CM

## 2016-12-29 DIAGNOSIS — R7302 Impaired glucose tolerance (oral): Secondary | ICD-10-CM | POA: Diagnosis not present

## 2016-12-29 NOTE — Progress Notes (Signed)
Subjective:    Patient ID: Willie Dominguez, male    DOB: [birth year], 66 y.o.   MRN: [id number]  HPI  66 year old patient who is seen today for follow-up of type 2 diabetes.  He has a history of peripheral neuropathy  As well as essential hypertension.  Doing well.  No concerns or complaints.  Lab Results  Component Value Date   HGBA1C 5.3 09/28/2016    Past Medical History:  Diagnosis Date  . Arthritis   . 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 Cataract    right eye- traumatic cataract from puncture wound as a child  . Chronic kidney disease    kidney stone  . H/O benign prostatic hypertrophy    mild  . High blood pressure   . History of BPH   . Neuromuscular disorder (HCC)    numbness toes  . Numbness of toes   . Other and unspecified hyperlipidemia     with elevated lipoprotein (a)  . Personal history of urinary calculi   . Retention of urine, unspecified   . Thrombocytopenia ([place])   . Unspecified adverse effect of unspecified drug, medicinal and biological substance   . Unspecified essential hypertension   . Urticaria, unspecified      Social History   Social History  . Marital status: Married    Spouse name: [name]  . Number of children: 2  . Years of education: college   Occupational History  . Pension scheme manager Employed   Social History Main Topics  . Smoking status: Never Smoker  . Smokeless tobacco: Never Used  . Alcohol use No  . Drug use: No  . Sexual activity: Not on file   Other Topics Concern  . Not on file   Social History Narrative   Married lives at home with his wife (carrie)    self employed.   College education   Right handed   Caffeine three cokes daily                   Past Surgical History:  Procedure Laterality Date  . COLONOSCOPY    . colonoscopy November 2007    . Inguinal herniorrhaphy  age 24    . LAPAROTOMY     age 19   . right foot tendon surgery Right 1984    Family History    Problem Relation Age of Onset  . COPD Mother   . Lung cancer Mother   . Diabetes Father   . Other Father        CABG  . Heart Problems Father   . Coronary artery disease Maternal Aunt   . Heart disease Brother   . Colon cancer Neg Hx   . Colon polyps Neg Hx   . Esophageal cancer Neg Hx   . Rectal cancer Neg Hx   . Stomach cancer Neg Hx     Allergies  Allergen Reactions  . Methotrexate Derivatives Other (See Comments)    Mouth ulcers  . Lipitor [Atorvastatin] Rash  . Bystolic [Nebivolol Hcl]   . Doxazosin     REACTION: aggrivated prostate, weakend stream, bladder pain and irritation  . Lisinopril     REACTION: Intolerance.  Depressive mood, fatigue  . Penicillins     REACTION: rash  . Simvastatin     REACTION: rash    Current Outpatient Prescriptions on File Prior to Visit  Medication Sig Dispense Refill  . aspirin 81 MG tablet Take  81 mg by mouth daily.      . Cholecalciferol (VITAMIN D3) 2000 units TABS Take 2 tablets by mouth daily.    . Coenzyme Q10 (CO Q-10) 200 MG CAPS Take 1 capsule by mouth daily.    . Cyanocobalamin (B-12 PO) Take 1,000 mcg by mouth daily.     . metFORMIN (GLUCOPHAGE-XR) 500 MG 24 hr tablet Take 1 tablet (500 mg total) by mouth daily with breakfast. 90 tablet 3  . Omega-3 Fatty Acids (FISH OIL) 1200 MG CAPS Take 2 capsules by mouth daily.    . pregabalin (LYRICA) 100 MG capsule Take 1 capsule (100 mg total) by mouth 2 (two) times daily. 60 capsule 5  . rosuvastatin (CRESTOR) 10 MG tablet Take 10 mg by mouth daily.  2  . tamsulosin (FLOMAX) 0.4 MG CAPS capsule Take 1 capsule (0.4 mg total) by mouth daily. 90 capsule 3  . valsartan (DIOVAN) 80 MG tablet Take 80 mg by mouth daily.  2   No current facility-administered medications on file prior to visit.     BP 112/68 (BP Location: Left Arm, Patient Position: Sitting, Cuff Size: Normal)   Pulse 65   Temp 98 F (36.7 C) (Oral)   Ht 5\' 11"  (1.803 m)   Wt 241 lb 6.4 oz (109.5 kg)   SpO2 98%    BMI 33.67 kg/m    Review of Systems  Constitutional: Negative for appetite change, chills, fatigue and fever.  HENT: Negative for congestion, dental problem, ear pain, hearing loss, sore throat, tinnitus, trouble swallowing and voice change.   Eyes: Negative for pain, discharge and visual disturbance.  Respiratory: Negative for cough, chest tightness, wheezing and stridor.   Cardiovascular: Negative for chest pain, palpitations and leg swelling.  Gastrointestinal: Negative for abdominal distention, abdominal pain, blood in stool, constipation, diarrhea, nausea and vomiting.  Genitourinary: Negative for difficulty urinating, discharge, flank pain, genital sores, hematuria and urgency.  Musculoskeletal: Negative for arthralgias, back pain, gait problem, joint swelling, myalgias and neck stiffness.  Skin: Negative for rash.  Neurological: Negative for dizziness, syncope, speech difficulty, weakness, numbness and headaches.  Hematological: Negative for adenopathy. Does not bruise/bleed easily.  Psychiatric/Behavioral: Negative for behavioral problems and dysphoric mood. The patient is not nervous/anxious.        Objective:   Physical Exam  Constitutional: He is oriented to person, place, and time. He appears well-developed.  Blood pressure 112/68  Weight 241  HENT:  Head: Normocephalic.  Right Ear: External ear normal.  Left Ear: External ear normal.  Eyes: Conjunctivae and EOM are normal.  Neck: Normal range of motion.  Cardiovascular: Normal rate and normal heart sounds.   Pulmonary/Chest: Breath sounds normal.  Abdominal: Bowel sounds are normal.  Musculoskeletal: Normal range of motion. He exhibits no edema or tenderness.  Neurological: He is alert and oriented to person, place, and time.  Psychiatric: He has a normal mood and affect. His behavior is normal.          Assessment & Plan:   Diabetes mellitus. Will review a hemoglobin A1c Essential hypertension Peripheral  neuropathy Dyslipidemia.  Continue statin therapy  No change in medical regimen CPX 6 months  Nathanael Krist Pilar Plate

## 2016-12-29 NOTE — Patient Instructions (Signed)
Limit your sodium (Salt) intake    It is important that you exercise regularly, at least 20 minutes 3 to 4 times per week.  If you develop chest pain or shortness of breath seek  medical attention.  Return in 6 months for follow-up  

## 2017-01-18 ENCOUNTER — Telehealth: Payer: Self-pay | Admitting: Internal Medicine

## 2017-01-18 NOTE — Telephone Encounter (Signed)
Copied from [place] #[id number]. Topic: Quick Communication - See Telephone Encounter >> Jan 18, 2017  4:15 PM Burnis Medin, NT wrote: CRM for notification. See Telephone encounter for: Pt is calling to see if he could get a refill on valsartan (DIOVAN) 80 MG tablet, rosuvastatin (CRESTOR) 10 MG tablet and pregabalin (LYRICA) 100 MG capsule. Pt uses [facility] [place] [address]  01/18/17.

## 2017-01-18 NOTE — Telephone Encounter (Signed)
Refill request

## 2017-01-19 ENCOUNTER — Other Ambulatory Visit: Payer: Self-pay | Admitting: Internal Medicine

## 2017-01-19 MED ORDER — ROSUVASTATIN CALCIUM 10 MG PO TABS: 10.0000 mg | ORAL_TABLET | Freq: Every day | ORAL | 2 refills | Status: DC

## 2017-01-19 MED ORDER — VALSARTAN 80 MG PO TABS: 80.0000 mg | ORAL_TABLET | Freq: Every day | ORAL | 2 refills | Status: DC

## 2017-01-19 MED ORDER — PREGABALIN 100 MG PO CAPS: 100.0000 mg | ORAL_CAPSULE | Freq: Two times a day (BID) | ORAL | 5 refills | Status: DC

## 2017-01-19 NOTE — Telephone Encounter (Signed)
Medications were refilled.  

## 2017-04-07 ENCOUNTER — Encounter: Payer: Self-pay | Admitting: Gastroenterology

## 2017-05-26 ENCOUNTER — Telehealth: Payer: Self-pay | Admitting: Gastroenterology

## 2017-05-26 NOTE — Telephone Encounter (Signed)
Routed to Dr. Danis. 

## 2017-05-26 NOTE — Telephone Encounter (Signed)
We both put recalls in so we would make sure he had a follow up colonoscopy. I would be happy to do it here since that it probably easier for him , and I do not expect that we are likely to find another large polyp this time.  If that's what he would like, please set up [facility] nurse visit to plan. If he would prefer to have it done with Dr. Stephanie Acre at [facility], that is fine too.  If so, he just needs to contact them.

## 2017-05-27 NOTE — Telephone Encounter (Signed)
Spoke to patient he prefers to have colonoscopy here, scheduled for pre-visit and colonoscopy at [facility].

## 2017-05-31 ENCOUNTER — Ambulatory Visit (AMBULATORY_SURGERY_CENTER): Payer: Self-pay

## 2017-05-31 VITALS — Ht 72.0 in | Wt 239.0 lb

## 2017-05-31 DIAGNOSIS — Z86010 Personal history of colonic polyps: Secondary | ICD-10-CM

## 2017-05-31 MED ORDER — PLENVU 140 G PO SOLR: 1.0000 | Freq: Once | ORAL | Status: AC

## 2017-05-31 NOTE — Progress Notes (Signed)
Per pt, no allergies to soy or egg products.Pt not taking any weight loss meds or using  O2 at home.  Pt refused emmi video. 

## 2017-06-01 ENCOUNTER — Encounter: Payer: Self-pay | Admitting: Gastroenterology

## 2017-06-04 ENCOUNTER — Telehealth: Payer: Self-pay | Admitting: Gastroenterology

## 2017-06-04 DIAGNOSIS — Z86010 Personal history of colonic polyps: Secondary | ICD-10-CM

## 2017-06-04 MED ORDER — PEG-KCL-NACL-NASULF-NA ASC-C 140 G PO SOLR: 1.0000 | Freq: Once | ORAL | 0 refills | Status: AC

## 2017-06-04 NOTE — Progress Notes (Signed)
Called pt and left a message I was sending prescription to pharmacy and call back if it was not the right pharmacy.Prescription for Plenvu [facility] to [facility] [place].

## 2017-06-11 ENCOUNTER — Other Ambulatory Visit: Payer: Self-pay

## 2017-06-11 ENCOUNTER — Encounter: Payer: Self-pay | Admitting: Gastroenterology

## 2017-06-11 ENCOUNTER — Ambulatory Visit (AMBULATORY_SURGERY_CENTER): Payer: 59 | Admitting: Gastroenterology

## 2017-06-11 VITALS — BP 100/52 | HR 51 | Temp 97.7°F | Resp 11 | Ht 72.0 in | Wt 239.0 lb

## 2017-06-11 DIAGNOSIS — Z860100 Personal history of colon polyps, unspecified: Secondary | ICD-10-CM

## 2017-06-11 DIAGNOSIS — D120 Benign neoplasm of cecum: Secondary | ICD-10-CM | POA: Diagnosis not present

## 2017-06-11 DIAGNOSIS — D122 Benign neoplasm of ascending colon: Secondary | ICD-10-CM | POA: Diagnosis not present

## 2017-06-11 DIAGNOSIS — K635 Polyp of colon: Secondary | ICD-10-CM

## 2017-06-11 MED ORDER — SODIUM CHLORIDE 0.9 % IV SOLN
500.0000 mL | Freq: Once | INTRAVENOUS | Status: DC
Start: 2017-06-11 — End: 2018-02-18

## 2017-06-11 NOTE — Patient Instructions (Signed)
Impression/Recommendations:  Polyp handout given to patient. Diverticulosis handout given to patient.  Resume previous diet. Continue present medications.  Repeat colonoscopy in 1 year for surveillance.  YOU HAD AN ENDOSCOPIC PROCEDURE TODAY AT THE [facility]:   Refer to the procedure report that was given to you for any specific questions about what was found during the examination.  If the procedure report does not answer your questions, please call your gastroenterologist to clarify.  If you requested that your care partner not be given the details of your procedure findings, then the procedure report has been included in a sealed envelope for you to review at your convenience later.  YOU SHOULD EXPECT: Some feelings of bloating in the abdomen. Passage of more gas than usual.  Walking can help get rid of the air that was put into your GI tract during the procedure and reduce the bloating. If you had a lower endoscopy (such as a colonoscopy or flexible sigmoidoscopy) you may notice spotting of blood in your stool or on the toilet paper. If you underwent a bowel prep for your procedure, you may not have a normal bowel movement for a few days.  Please Note:  You might notice some irritation and congestion in your nose or some drainage.  This is from the oxygen used during your procedure.  There is no need for concern and it should clear up in a day or so.  SYMPTOMS TO REPORT IMMEDIATELY:   Following lower endoscopy (colonoscopy or flexible sigmoidoscopy):  Excessive amounts of blood in the stool  Significant tenderness or worsening of abdominal pains  Swelling of the abdomen that is new, acute  Fever of 100F or higher For urgent or emergent issues, a gastroenterologist can be reached at any hour by calling [phone].   DIET:  We do recommend a small meal at first, but then you may proceed to your regular diet.  Drink plenty of fluids but you should avoid alcoholic  beverages for 24 hours.  ACTIVITY:  You should plan to take it easy for the rest of today and you should NOT DRIVE or use heavy machinery until tomorrow (because of the sedation medicines used during the test).    FOLLOW UP: Our staff will call the number listed on your records the next business day following your procedure to check on you and address any questions or concerns that you may have regarding the information given to you following your procedure. If we do not reach you, we will leave a message.  However, if you are feeling well and you are not experiencing any problems, there is no need to return our call.  We will assume that you have returned to your regular daily activities without incident.  If any biopsies were taken you will be contacted by phone or by letter within the next 1-3 weeks.  Please call us at [phone] if you have not heard about the biopsies in 3 weeks.    SIGNATURES/CONFIDENTIALITY: You and/or your care partner have signed paperwork which will be entered into your electronic medical record.  These signatures attest to the fact that that the information above on your After Visit Summary has been reviewed and is understood.  Full responsibility of the confidentiality of this discharge information lies with you and/or your care-partner. 

## 2017-06-11 NOTE — Progress Notes (Signed)
A and O x3. Report to RN. Tolerated MAC anesthesia well.

## 2017-06-11 NOTE — Op Note (Signed)
[facility] Patient Name: Willie Dominguez Procedure Date: 06/11/2017 9:57 AM MRN: [id number] Endoscopist: Mallie Mussel L. Loletha Carrow , MD Age: 67 Referring MD:  Date of Birth: Jul 15, 1950 Gender: Male Account #: [id number] Procedure:                Colonoscopy Indications:              Surveillance: Piecemeal removal of large sessile                            adenoma last colonoscopy (< 3 yrs) (85mm cecal                            aneoma discovered 01/2016, removed at [facility] via EMR                            03/2016) Medicines:                Monitored Anesthesia Care Procedure:                Pre-Anesthesia Assessment:                           - Prior to the procedure, a History and Physical                            was performed, and patient medications and                            allergies were reviewed. The patient's tolerance of                            previous anesthesia was also reviewed. The risks                            and benefits of the procedure and the sedation                            options and risks were discussed with the patient.                            All questions were answered, and informed consent                            was obtained. Prior Anticoagulants: The patient has                            taken no previous anticoagulant or antiplatelet                            agents. ASA Grade Assessment: II - A patient with                            mild systemic disease. After reviewing the risks  and benefits, the patient was deemed in                            satisfactory condition to undergo the procedure.                           After obtaining informed consent, the colonoscope                            was passed under direct vision. Throughout the                            procedure, the patient's blood pressure, pulse, and                            oxygen saturations were monitored continuously. The                        Colonoscope was introduced through the anus and                            advanced to the the cecum, identified by                            appendiceal orifice and ileocecal valve. The                            colonoscopy was performed with difficulty due to a                            redundant colon. Successful completion of the                            procedure was aided by using manual pressure. The                            patient tolerated the procedure well. The quality                            of the bowel preparation was good. The ileocecal                            valve, appendiceal orifice, and rectum were                            photographed. The quality of the bowel preparation                            was evaluated using the BBPS [facility] Bowel                            Preparation Scale) with scores of: Right Colon = 2,  Transverse Colon = 2 and Left Colon = 2. The total                            BBPS score equals 6. , after lavage. (The patient                            vomited after AM prep dose and then took miralax)                            The bowel preparation used was Plenvu. Scope In: 10:13:03 AM Scope Out: 10:53:42 AM Scope Withdrawal Time: 0 hours 33 minutes 53 seconds  Total Procedure Duration: 0 hours 40 minutes 39 seconds  Findings:                 The perianal and digital rectal examinations were                            normal.                           Two sessile polyps were found in the distal                            ascending colon. The polyps were 4 to 6 mm in size.                            These polyps were removed with a cold snare.                            Resection and retrieval were complete. (Jar 1)                           A 12 mm polyp was found in the cecum at the site of                            prior polypectomy. The polyp was sessile. Area was                             successfully injected with 10 mL saline for a lift                            polypectomy. The polyp was removed with a piecemeal                            technique using a hot snare. Resection and                            retrieval were near complete, then a few remaining                            edge pieces ablated with cautery.(Jar 2)  A 10 mm polyp was found in the proximal ascending                            colon. The polyp was sessile. The polyp was removed                            with a hot snare. Resection and retrieval were                            complete.                           Multiple medium-mouthed diverticula were found in                            the left colon.                           The exam was otherwise without abnormality on                            direct and retroflexion views. Complications:            No immediate complications. Estimated Blood Loss:     Estimated blood loss was minimal. Impression:               - Two 4 to 6 mm polyps in the distal ascending                            colon, removed with a cold snare. Resected and                            retrieved.                           - One 12 mm polyp in the cecum, removed piecemeal                            using a hot snare. Resected and retrieved. Injected.                           - One 10 mm polyp in the proximal ascending colon,                            removed with a hot snare. Resected and retrieved.                           - Diverticulosis in the left colon.                           - The examination was otherwise normal on direct                            and retroflexion views. Recommendation:           -  Patient has a contact number available for                            emergencies. The signs and symptoms of potential                            delayed complications were discussed with the                             patient. Return to normal activities tomorrow.                            Written discharge instructions were provided to the                            patient.                           - Resume previous diet.                           - Continue present medications.                           - Await pathology results.                           - Repeat colonoscopy in 1 year for surveillance. Henry L. Loletha Carrow, MD 06/11/2017 11:05:18 AM This report has been signed electronically.

## 2017-06-11 NOTE — Progress Notes (Signed)
Called to room to assist during endoscopic procedure.  Patient ID and intended procedure confirmed with present staff. Received instructions for my participation in the procedure from the performing physician.  

## 2017-06-14 ENCOUNTER — Telehealth: Payer: Self-pay

## 2017-06-14 NOTE — Telephone Encounter (Signed)
  Follow up Call-  Call back number 06/11/2017 01/21/2016  Post procedure Call Back phone  # [phone]  Permission to leave phone message Yes Yes  Some recent data might be hidden     Patient questions:  Do you have a fever, pain , or abdominal swelling? No. Pain Score  0 *  Have you tolerated food without any problems? Yes.    Have you been able to return to your normal activities? Yes.    Do you have any questions about your discharge instructions: Diet   No. Medications  No. Follow up visit  No.  Do you have questions or concerns about your Care? No.  Actions: * If pain score is 4 or above: No action needed, pain <4.

## 2017-06-24 ENCOUNTER — Encounter: Payer: Self-pay | Admitting: Gastroenterology

## 2017-10-01 ENCOUNTER — Other Ambulatory Visit: Payer: Self-pay | Admitting: Internal Medicine

## 2017-10-01 NOTE — Telephone Encounter (Signed)
Left message to return phone call.

## 2017-10-01 NOTE — Telephone Encounter (Signed)
Patient need to schedule an ov for more refills. 

## 2018-01-30 DIAGNOSIS — Z86718 Personal history of other venous thrombosis and embolism: Secondary | ICD-10-CM

## 2018-01-30 HISTORY — DX: Personal history of other venous thrombosis and embolism: Z86.718

## 2018-02-16 ENCOUNTER — Encounter (HOSPITAL_COMMUNITY): Payer: Self-pay | Admitting: Radiology

## 2018-02-16 ENCOUNTER — Other Ambulatory Visit: Payer: Self-pay

## 2018-02-16 ENCOUNTER — Ambulatory Visit (HOSPITAL_COMMUNITY)
Admission: RE | Admit: 2018-02-16 | Discharge: 2018-02-16 | Disposition: A | Discharge status: Home / Self Care | Payer: 59 | Source: Ambulatory Visit | Attending: Family Medicine | Admitting: Family Medicine

## 2018-02-16 ENCOUNTER — Telehealth: Payer: Self-pay | Admitting: Family Medicine

## 2018-02-16 ENCOUNTER — Encounter (HOSPITAL_COMMUNITY): Payer: Self-pay

## 2018-02-16 ENCOUNTER — Telehealth: Payer: Self-pay

## 2018-02-16 ENCOUNTER — Inpatient Hospital Stay (HOSPITAL_COMMUNITY)
Admission: EM | Admit: 2018-02-16 | Discharge: 2018-02-18 | DRG: 176 | Disposition: A | Discharge status: Home / Self Care | Payer: 59 | Attending: Internal Medicine | Admitting: Internal Medicine

## 2018-02-16 ENCOUNTER — Telehealth: Payer: Self-pay | Admitting: General Practice

## 2018-02-16 ENCOUNTER — Encounter: Payer: Self-pay | Admitting: Family Medicine

## 2018-02-16 ENCOUNTER — Ambulatory Visit (HOSPITAL_BASED_OUTPATIENT_CLINIC_OR_DEPARTMENT_OTHER)
Admission: RE | Admit: 2018-02-16 | Discharge: 2018-02-16 | Disposition: A | Discharge status: Home / Self Care | Payer: 59 | Source: Ambulatory Visit | Attending: Family Medicine | Admitting: Family Medicine

## 2018-02-16 ENCOUNTER — Ambulatory Visit: Payer: 59 | Admitting: Family Medicine

## 2018-02-16 VITALS — BP 130/84 | HR 74 | Temp 97.8°F | Resp 16 | Ht 72.0 in | Wt 234.2 lb

## 2018-02-16 DIAGNOSIS — I82432 Acute embolism and thrombosis of left popliteal vein: Secondary | ICD-10-CM | POA: Diagnosis present

## 2018-02-16 DIAGNOSIS — Z86711 Personal history of pulmonary embolism: Secondary | ICD-10-CM | POA: Insufficient documentation

## 2018-02-16 DIAGNOSIS — M7989 Other specified soft tissue disorders: Secondary | ICD-10-CM

## 2018-02-16 DIAGNOSIS — Z7984 Long term (current) use of oral hypoglycemic drugs: Secondary | ICD-10-CM

## 2018-02-16 DIAGNOSIS — Z888 Allergy status to other drugs, medicaments and biological substances status: Secondary | ICD-10-CM

## 2018-02-16 DIAGNOSIS — Z87442 Personal history of urinary calculi: Secondary | ICD-10-CM

## 2018-02-16 DIAGNOSIS — I824Y3 Acute embolism and thrombosis of unspecified deep veins of proximal lower extremity, bilateral: Secondary | ICD-10-CM | POA: Diagnosis not present

## 2018-02-16 DIAGNOSIS — R0609 Other forms of dyspnea: Secondary | ICD-10-CM

## 2018-02-16 DIAGNOSIS — Z23 Encounter for immunization: Secondary | ICD-10-CM

## 2018-02-16 DIAGNOSIS — Z825 Family history of asthma and other chronic lower respiratory diseases: Secondary | ICD-10-CM

## 2018-02-16 DIAGNOSIS — I82493 Acute embolism and thrombosis of other specified deep vein of lower extremity, bilateral: Secondary | ICD-10-CM

## 2018-02-16 DIAGNOSIS — I82452 Acute embolism and thrombosis of left peroneal vein: Secondary | ICD-10-CM | POA: Diagnosis present

## 2018-02-16 DIAGNOSIS — H5461 Unqualified visual loss, right eye, normal vision left eye: Secondary | ICD-10-CM | POA: Diagnosis present

## 2018-02-16 DIAGNOSIS — I2699 Other pulmonary embolism without acute cor pulmonale: Secondary | ICD-10-CM | POA: Diagnosis present

## 2018-02-16 DIAGNOSIS — I82442 Acute embolism and thrombosis of left tibial vein: Secondary | ICD-10-CM | POA: Diagnosis present

## 2018-02-16 DIAGNOSIS — E785 Hyperlipidemia, unspecified: Secondary | ICD-10-CM | POA: Diagnosis present

## 2018-02-16 DIAGNOSIS — N400 Enlarged prostate without lower urinary tract symptoms: Secondary | ICD-10-CM | POA: Diagnosis present

## 2018-02-16 DIAGNOSIS — I10 Essential (primary) hypertension: Secondary | ICD-10-CM | POA: Diagnosis not present

## 2018-02-16 DIAGNOSIS — I82412 Acute embolism and thrombosis of left femoral vein: Secondary | ICD-10-CM | POA: Diagnosis present

## 2018-02-16 DIAGNOSIS — I82451 Acute embolism and thrombosis of right peroneal vein: Secondary | ICD-10-CM | POA: Diagnosis present

## 2018-02-16 DIAGNOSIS — I2694 Multiple subsegmental pulmonary emboli without acute cor pulmonale: Secondary | ICD-10-CM | POA: Diagnosis not present

## 2018-02-16 DIAGNOSIS — I82462 Acute embolism and thrombosis of left calf muscular vein: Secondary | ICD-10-CM | POA: Diagnosis present

## 2018-02-16 DIAGNOSIS — Z8249 Family history of ischemic heart disease and other diseases of the circulatory system: Secondary | ICD-10-CM

## 2018-02-16 DIAGNOSIS — Z833 Family history of diabetes mellitus: Secondary | ICD-10-CM

## 2018-02-16 DIAGNOSIS — Z7901 Long term (current) use of anticoagulants: Secondary | ICD-10-CM

## 2018-02-16 DIAGNOSIS — E7841 Elevated Lipoprotein(a): Secondary | ICD-10-CM | POA: Diagnosis present

## 2018-02-16 DIAGNOSIS — M1990 Unspecified osteoarthritis, unspecified site: Secondary | ICD-10-CM | POA: Diagnosis present

## 2018-02-16 DIAGNOSIS — M79662 Pain in left lower leg: Secondary | ICD-10-CM | POA: Insufficient documentation

## 2018-02-16 DIAGNOSIS — Z801 Family history of malignant neoplasm of trachea, bronchus and lung: Secondary | ICD-10-CM

## 2018-02-16 DIAGNOSIS — D696 Thrombocytopenia, unspecified: Secondary | ICD-10-CM

## 2018-02-16 DIAGNOSIS — Z880 Allergy status to penicillin: Secondary | ICD-10-CM

## 2018-02-16 DIAGNOSIS — Z79899 Other long term (current) drug therapy: Secondary | ICD-10-CM

## 2018-02-16 DIAGNOSIS — G629 Polyneuropathy, unspecified: Secondary | ICD-10-CM | POA: Diagnosis present

## 2018-02-16 LAB — COMPREHENSIVE METABOLIC PANEL
ALT: 17 U/L (ref 0–53)
AST: 16 U/L (ref 0–37)
Albumin: 4.3 g/dL (ref 3.5–5.2)
Alkaline Phosphatase: 103 U/L (ref 39–117)
BUN: 20 mg/dL (ref 6–23)
CHLORIDE: 104 meq/L (ref 96–112)
CO2: 29 meq/L (ref 19–32)
Calcium: 9.7 mg/dL (ref 8.4–10.5)
Creatinine, Ser: 1.16 mg/dL (ref 0.40–1.50)
GFR: 66.73 mL/min (ref >60.00)
Glucose, Bld: 114 mg/dL — ABNORMAL HIGH (ref 70–99)
POTASSIUM: 5 meq/L (ref 3.5–5.1)
Sodium: 140 mEq/L (ref 135–145)
Total Bilirubin: 0.7 mg/dL (ref 0.2–1.2)
Total Protein: 7.2 g/dL (ref 6.0–8.3)

## 2018-02-16 LAB — CBC WITH DIFFERENTIAL/PLATELET
Abs Immature Granulocytes: 0.05 10*3/uL (ref 0.00–0.07)
Basophils Absolute: 0 10*3/uL (ref 0.0–0.1)
Basophils Relative: 0 %
Eosinophils Absolute: 0.2 10*3/uL (ref 0.0–0.5)
Eosinophils Relative: 3 %
HCT: 40.7 % (ref 39.0–52.0)
HEMOGLOBIN: 12.8 g/dL — AB (ref 13.0–17.0)
Immature Granulocytes: 1 %
LYMPHS PCT: 22 %
Lymphs Abs: 1.4 10*3/uL (ref 0.7–4.0)
MCH: 30.5 pg (ref 26.0–34.0)
MCHC: 31.4 g/dL (ref 30.0–36.0)
MCV: 96.9 fL (ref 80.0–100.0)
MONO ABS: 0.7 10*3/uL (ref 0.1–1.0)
Monocytes Relative: 10 %
Neutro Abs: 4.2 10*3/uL (ref 1.7–7.7)
Neutrophils Relative %: 64 %
Platelets: 120 10*3/uL — ABNORMAL LOW (ref 150–400)
RBC: 4.2 MIL/uL — ABNORMAL LOW (ref 4.22–5.81)
RDW: 13.2 % (ref 11.5–15.5)
WBC: 6.6 10*3/uL (ref 4.0–10.5)
nRBC: 0 % (ref 0.0–0.2)

## 2018-02-16 LAB — PROTIME-INR
INR: 1.7
Prothrombin Time: 19.8 seconds — ABNORMAL HIGH (ref 11.4–15.2)

## 2018-02-16 LAB — BASIC METABOLIC PANEL
Anion gap: 9 (ref 5–15)
BUN: 18 mg/dL (ref 8–23)
CO2: 26 mmol/L (ref 22–32)
Calcium: 9 mg/dL (ref 8.9–10.3)
Chloride: 105 mmol/L (ref 98–111)
Creatinine, Ser: 1.13 mg/dL (ref 0.61–1.24)
GFR calc Af Amer: >60 mL/min (ref >60)
GFR calc non Af Amer: >60 mL/min (ref >60)
Glucose, Bld: 102 mg/dL — ABNORMAL HIGH (ref 70–99)
Potassium: 4.3 mmol/L (ref 3.5–5.1)
Sodium: 140 mmol/L (ref 135–145)

## 2018-02-16 LAB — CBC
HEMATOCRIT: 42.2 % (ref 39.0–52.0)
HEMOGLOBIN: 14.3 g/dL (ref 13.0–17.0)
MCHC: 34 g/dL (ref 30.0–36.0)
MCV: 91.3 fl (ref 78.0–100.0)
PLATELETS: 131 10*3/uL — AB (ref 150.0–400.0)
RBC: 4.62 Mil/uL (ref 4.22–5.81)
RDW: 13.4 % (ref 11.5–15.5)
WBC: 6.8 10*3/uL (ref 4.0–10.5)

## 2018-02-16 LAB — HEPARIN LEVEL (UNFRACTIONATED): Heparin Unfractionated: >2.2 IU/mL — ABNORMAL HIGH (ref 0.30–0.70)

## 2018-02-16 LAB — APTT: aPTT: 37 seconds — ABNORMAL HIGH (ref 24–36)

## 2018-02-16 LAB — D-DIMER, QUANTITATIVE: D-Dimer, Quant: >35 mcg/mL FEU — ABNORMAL HIGH (ref <0.50)

## 2018-02-16 MED ORDER — SODIUM CHLORIDE 0.9% FLUSH
3.0000 mL | Freq: Two times a day (BID) | INTRAVENOUS | Status: DC
  Administered 2018-02-17 – 2018-02-18 (×2): 3 mL via INTRAVENOUS

## 2018-02-16 MED ORDER — MORPHINE SULFATE (PF) 4 MG/ML IV SOLN
4.0000 mg | Freq: Once | INTRAVENOUS | Status: AC
  Administered 2018-02-16: 4 mg via INTRAVENOUS
  Filled 2018-02-16: qty 1

## 2018-02-16 MED ORDER — IOPAMIDOL (ISOVUE-370) INJECTION 76%
100.0000 mL | Freq: Once | INTRAVENOUS | Status: AC | PRN
  Administered 2018-02-16: 100 mL via INTRAVENOUS

## 2018-02-16 MED ORDER — ROSUVASTATIN CALCIUM 10 MG PO TABS: 10.0000 mg | ORAL_TABLET | Freq: Every day | ORAL | 0 refills | Status: DC

## 2018-02-16 MED ORDER — HEPARIN SODIUM (PORCINE) 5000 UNIT/ML IJ SOLN: 4000.0000 [IU] | Freq: Once | INTRAMUSCULAR | Status: DC

## 2018-02-16 MED ORDER — VALSARTAN 80 MG PO TABS: 80.0000 mg | ORAL_TABLET | Freq: Every day | ORAL | 0 refills | Status: DC

## 2018-02-16 MED ORDER — ACETAMINOPHEN 650 MG RE SUPP: 650.0000 mg | Freq: Four times a day (QID) | RECTAL | Status: DC | PRN

## 2018-02-16 MED ORDER — ONDANSETRON HCL 4 MG/2ML IJ SOLN: 4.0000 mg | Freq: Four times a day (QID) | INTRAMUSCULAR | Status: DC | PRN

## 2018-02-16 MED ORDER — ACETAMINOPHEN 325 MG PO TABS
650.0000 mg | ORAL_TABLET | Freq: Four times a day (QID) | ORAL | Status: DC | PRN
  Administered 2018-02-17 – 2018-02-18 (×3): 650 mg via ORAL
  Filled 2018-02-16 (×3): qty 2

## 2018-02-16 MED ORDER — IOPAMIDOL (ISOVUE-370) INJECTION 76%
INTRAVENOUS | Status: AC
  Filled 2018-02-16: qty 100

## 2018-02-16 MED ORDER — HEPARIN (PORCINE) 25000 UT/250ML-% IV SOLN
1700.0000 [IU]/h | INTRAVENOUS | Status: DC
  Administered 2018-02-16 – 2018-02-17 (×2): 1700 [IU]/h via INTRAVENOUS
  Filled 2018-02-16 (×2): qty 250

## 2018-02-16 MED ORDER — ONDANSETRON HCL 4 MG PO TABS: 4.0000 mg | ORAL_TABLET | Freq: Four times a day (QID) | ORAL | Status: DC | PRN

## 2018-02-16 MED ORDER — HEPARIN (PORCINE) 25000 UT/250ML-% IV SOLN: 10.0000 [IU]/kg/h | INTRAVENOUS | Status: DC

## 2018-02-16 MED ORDER — OXYCODONE HCL 5 MG PO TABS
5.0000 mg | ORAL_TABLET | ORAL | Status: DC | PRN
  Administered 2018-02-17: 5 mg via ORAL
  Filled 2018-02-16: qty 1

## 2018-02-16 MED ORDER — SODIUM CHLORIDE (PF) 0.9 % IJ SOLN
INTRAMUSCULAR | Status: AC
  Filled 2018-02-16: qty 50

## 2018-02-16 MED ORDER — IRBESARTAN 150 MG PO TABS
75.0000 mg | ORAL_TABLET | Freq: Every day | ORAL | Status: DC
  Administered 2018-02-17 – 2018-02-18 (×2): 75 mg via ORAL
  Filled 2018-02-16 (×3): qty 1

## 2018-02-16 NOTE — Progress Notes (Unsigned)
Spoke with Sheena around 3:20 pm regarding Willie Dominguez preliminary report of an acute thrombus noted in the right calf vein (peroneal) and the left gastrocneumis veins, popliteal veins and calf veins (PTVs and peroneal veins). There is also acute thrombus noted behind the venous valves in the left proximal femoral vein. Patient has a one year history of SOB but worsening since 02/12/2018. Patient was instructed to wait in the lobby at [facility] to see where he will be sent for a CTA per [facility].

## 2018-02-16 NOTE — Telephone Encounter (Signed)
He is already on Xarelto, started today 15 mg twice daily. If chest CTA is positive, result can be run with on-call provider. If chest CTA is negative, patient can go home and follow with PCP in within a week.  Cornie Mccomber [name], MD

## 2018-02-16 NOTE — ED Notes (Signed)
Wife: [name] [phone]  Call with updates

## 2018-02-16 NOTE — Telephone Encounter (Signed)
Spoke with Dr Anselm Pancoast about chest CTA results. Sub-massive PE with signs of right heart stress.  He already had LE venous US,positive for bilateral PE.  He is still in [place]. I spoke with [place], who agrees to convey information to patient (still there and hemodynamically stable) and send him to the ER right now. [place] voices understanding.  [name], MD

## 2018-02-16 NOTE — ED Notes (Signed)
Bed: WA07 Expected date:  Expected time:  Means of arrival:  Comments: Hold for triage 1 
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
## 2018-02-16 NOTE — Patient Instructions (Addendum)
A few things to remember from today's visit:   Pain and swelling of left lower leg - Plan: VAS [place] LOWER EXTREMITY VENOUS (DVT), D-dimer, Quantitative, CBC  Essential hypertension - Plan: Comprehensive metabolic panel  Dyslipidemia - Plan: Comprehensive metabolic panel  Today I refilled your medications as requested. You need to arrange an appointment with your doctor before your annual prescription.   Please be sure medication list is accurate. If a new problem present, please set up appointment sooner than planned today.
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
## 2018-02-16 NOTE — Telephone Encounter (Signed)
FYI Mr. Willie Dominguez CT could not do CT at their office per Erline Levine they did not have a [facility] so I scheduled with [facility]  Pt scheduled for 02-16-2018 at [facility] long - ( Now ) I spoke with pt wife connie I  informed her per [place] scheduler marleda -patient need to  go on over to [facility] 3:30 pm   and it will be a  Long wait -  I give the call report number of [phone] to call Dr [name] of the results they will  hold the pt until they speak with the Dr [name] .

## 2018-02-16 NOTE — Telephone Encounter (Signed)
CALL REPORT from [facility] at vascular  DVT STUDY: POSITIVE for bilateral DVT's: L - gastroc to calf; R - calf They are recommending PE study d/t pt's results and c/o increased ShOB.   Dr. [name] advised and she agrees with PE study. Order placed as STAT. Pt already placed on Xarelto today at [place].

## 2018-02-16 NOTE — ED Triage Notes (Signed)
Pt arrives from our CT dept. Pt had [place] of left leg today. DX with DVT, took 15mg  Xarelto. Pt sent to CT to r/o PE. Per CT: Pt has bilateral PE's. Pt reports shortness of breath. Denies chest pain.
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
## 2018-02-17 ENCOUNTER — Observation Stay (HOSPITAL_COMMUNITY): Payer: 59

## 2018-02-17 ENCOUNTER — Other Ambulatory Visit: Payer: Self-pay

## 2018-02-17 DIAGNOSIS — I2694 Multiple subsegmental pulmonary emboli without acute cor pulmonale: Principal | ICD-10-CM

## 2018-02-17 DIAGNOSIS — G629 Polyneuropathy, unspecified: Secondary | ICD-10-CM | POA: Diagnosis present

## 2018-02-17 DIAGNOSIS — I82432 Acute embolism and thrombosis of left popliteal vein: Secondary | ICD-10-CM | POA: Diagnosis present

## 2018-02-17 DIAGNOSIS — I82452 Acute embolism and thrombosis of left peroneal vein: Secondary | ICD-10-CM | POA: Diagnosis present

## 2018-02-17 DIAGNOSIS — I82442 Acute embolism and thrombosis of left tibial vein: Secondary | ICD-10-CM | POA: Diagnosis present

## 2018-02-17 DIAGNOSIS — Z880 Allergy status to penicillin: Secondary | ICD-10-CM | POA: Diagnosis not present

## 2018-02-17 DIAGNOSIS — M1990 Unspecified osteoarthritis, unspecified site: Secondary | ICD-10-CM | POA: Diagnosis present

## 2018-02-17 DIAGNOSIS — I82412 Acute embolism and thrombosis of left femoral vein: Secondary | ICD-10-CM | POA: Diagnosis present

## 2018-02-17 DIAGNOSIS — D696 Thrombocytopenia, unspecified: Secondary | ICD-10-CM | POA: Diagnosis present

## 2018-02-17 DIAGNOSIS — I10 Essential (primary) hypertension: Secondary | ICD-10-CM

## 2018-02-17 DIAGNOSIS — Z7901 Long term (current) use of anticoagulants: Secondary | ICD-10-CM | POA: Diagnosis not present

## 2018-02-17 DIAGNOSIS — I824Y3 Acute embolism and thrombosis of unspecified deep veins of proximal lower extremity, bilateral: Secondary | ICD-10-CM | POA: Diagnosis present

## 2018-02-17 DIAGNOSIS — I82462 Acute embolism and thrombosis of left calf muscular vein: Secondary | ICD-10-CM | POA: Diagnosis present

## 2018-02-17 DIAGNOSIS — Z7984 Long term (current) use of oral hypoglycemic drugs: Secondary | ICD-10-CM | POA: Diagnosis not present

## 2018-02-17 DIAGNOSIS — I82451 Acute embolism and thrombosis of right peroneal vein: Secondary | ICD-10-CM | POA: Diagnosis present

## 2018-02-17 DIAGNOSIS — N400 Enlarged prostate without lower urinary tract symptoms: Secondary | ICD-10-CM | POA: Diagnosis present

## 2018-02-17 DIAGNOSIS — Z79899 Other long term (current) drug therapy: Secondary | ICD-10-CM | POA: Diagnosis not present

## 2018-02-17 DIAGNOSIS — I2699 Other pulmonary embolism without acute cor pulmonale: Secondary | ICD-10-CM | POA: Diagnosis not present

## 2018-02-17 DIAGNOSIS — E785 Hyperlipidemia, unspecified: Secondary | ICD-10-CM

## 2018-02-17 DIAGNOSIS — H5461 Unqualified visual loss, right eye, normal vision left eye: Secondary | ICD-10-CM | POA: Diagnosis present

## 2018-02-17 DIAGNOSIS — Z87442 Personal history of urinary calculi: Secondary | ICD-10-CM | POA: Diagnosis not present

## 2018-02-17 DIAGNOSIS — Z801 Family history of malignant neoplasm of trachea, bronchus and lung: Secondary | ICD-10-CM | POA: Diagnosis not present

## 2018-02-17 DIAGNOSIS — Z825 Family history of asthma and other chronic lower respiratory diseases: Secondary | ICD-10-CM | POA: Diagnosis not present

## 2018-02-17 DIAGNOSIS — Z888 Allergy status to other drugs, medicaments and biological substances status: Secondary | ICD-10-CM | POA: Diagnosis not present

## 2018-02-17 DIAGNOSIS — Z23 Encounter for immunization: Secondary | ICD-10-CM | POA: Diagnosis not present

## 2018-02-17 DIAGNOSIS — Z833 Family history of diabetes mellitus: Secondary | ICD-10-CM | POA: Diagnosis not present

## 2018-02-17 DIAGNOSIS — Z8249 Family history of ischemic heart disease and other diseases of the circulatory system: Secondary | ICD-10-CM | POA: Diagnosis not present

## 2018-02-17 LAB — CBC
HCT: 38.4 % — ABNORMAL LOW (ref 39.0–52.0)
Hemoglobin: 12.2 g/dL — ABNORMAL LOW (ref 13.0–17.0)
MCH: 30.3 pg (ref 26.0–34.0)
MCHC: 31.8 g/dL (ref 30.0–36.0)
MCV: 95.3 fL (ref 80.0–100.0)
Platelets: 111 10*3/uL — ABNORMAL LOW (ref 150–400)
RBC: 4.03 MIL/uL — ABNORMAL LOW (ref 4.22–5.81)
RDW: 13.2 % (ref 11.5–15.5)
WBC: 5.5 10*3/uL (ref 4.0–10.5)
nRBC: 0 % (ref 0.0–0.2)

## 2018-02-17 LAB — ECHOCARDIOGRAM COMPLETE
Height: 72 in
Weight: 3749.58 oz

## 2018-02-17 LAB — HEPARIN LEVEL (UNFRACTIONATED)
Heparin Unfractionated: 1.32 IU/mL — ABNORMAL HIGH (ref 0.30–0.70)
Heparin Unfractionated: 0.79 IU/mL — ABNORMAL HIGH (ref 0.30–0.70)
Heparin Unfractionated: 0.65 IU/mL (ref 0.30–0.70)

## 2018-02-17 LAB — BASIC METABOLIC PANEL
Anion gap: 7 (ref 5–15)
BUN: 18 mg/dL (ref 8–23)
CO2: 26 mmol/L (ref 22–32)
Calcium: 8.7 mg/dL — ABNORMAL LOW (ref 8.9–10.3)
Chloride: 105 mmol/L (ref 98–111)
Creatinine, Ser: 1.13 mg/dL (ref 0.61–1.24)
GFR calc Af Amer: >60 mL/min (ref >60)
GFR calc non Af Amer: >60 mL/min (ref >60)
GLUCOSE: 111 mg/dL — AB (ref 70–99)
Potassium: 3.8 mmol/L (ref 3.5–5.1)
Sodium: 138 mmol/L (ref 135–145)

## 2018-02-17 LAB — APTT
APTT: 87 s — AB (ref 24–36)
aPTT: 104 seconds — ABNORMAL HIGH (ref 24–36)
aPTT: 81 seconds — ABNORMAL HIGH (ref 24–36)

## 2018-02-17 LAB — HIV ANTIBODY (ROUTINE TESTING W REFLEX): HIV Screen 4th Generation wRfx: NONREACTIVE

## 2018-02-17 MED ORDER — HEPARIN (PORCINE) 25000 UT/250ML-% IV SOLN
1600.0000 [IU]/h | INTRAVENOUS | Status: DC
  Administered 2018-02-17 – 2018-02-18 (×2): 1600 [IU]/h via INTRAVENOUS
  Filled 2018-02-17: qty 250

## 2018-02-17 NOTE — ED Notes (Signed)
Pt placed on inpatient hospital bed for comfort

## 2018-02-17 NOTE — Care Management Note (Signed)
Case Management Note  CM saw note from admission RN that pt had concerns about flu and pneumonia vaccines.  CM contacted [facility] and confirmed that fluzone high dose and pneumovax23 are covered at 100% under preventative care.  CM spoke with pt who advised he was agreeable to receiving the vaccines now that he knows they are covered.  Updated Dr. Wyline Copas.  Lasondra Hodgkins, Benjaman Lobe, RN 02/17/2018, 11:32 AM

## 2018-02-17 NOTE — Progress Notes (Signed)
ANTICOAGULATION CONSULT NOTE - Follow Up Consult  Pharmacy Consult for Heparin Indication: pulmonary embolus  Allergies  Allergen Reactions  . Methotrexate Derivatives Other (See Comments)    Mouth ulcers  . Doxazosin     REACTION: aggrivated prostate, weakend stream, bladder pain and irritation Other reaction(s): Other (See Comments) Difficulty urinating  . Lipitor [Atorvastatin] Rash  . Lisinopril     REACTION: Intolerance.  Depressive mood, fatigue  . Penicillins Rash    REACTION: rash DID THE REACTION INVOLVE: Swelling of the face/tongue/throat, SOB, or low BP? Y Sudden or severe rash/hives, skin peeling, or the inside of the mouth or nose? Y Did it require medical treatment? N When did it last happen? 1970 If all above answers are "NO", may proceed with cephalosporin use.   [name] Bystolic [Nebivolol Hcl]     rash  . Simvastatin     REACTION: rash    Patient Measurements: Weight: 234 lb 5.6 oz (106.3 kg) Heparin Dosing Weight:   Vital Signs: BP: 127/73 (12/19 0600) Pulse Rate: 66 (12/19 0600)  Labs: Recent Labs    02/16/18 1232 02/16/18 1838 02/17/18 0607  HGB 14.3 12.8* 12.2*  HCT 42.2 40.7 38.4*  PLT 131.0* 120* 111*  APTT  --  37* 87*  LABPROT  --  19.8*  --   INR  --  1.70  --   HEPARINUNFRC  --  >2.20*  --   CREATININE 1.16 1.13 1.13    Estimated Creatinine Clearance: 79.9 mL/min (by C-G formula based on SCr of 1.13 mg/dL).   Medications:  Infusions:  . sodium chloride    . heparin 1,700 Units/hr (02/17/18 0538)    Assessment: Patient on heparin for PE with PTT at goal.  Heparin level still not reported yet, but feel this is due to expected high level.  PTT ordered with Heparin level until both correlate due to possible drug-lab interaction between oral anticoagulant (rivaroxaban, edoxaban, or apixaban) and anti-Xa level (aka heparin level)    Goal of Therapy:  Heparin level 0.3-0.7 units/ml aPTT 66-102 seconds Monitor platelets by  anticoagulation protocol: Yes   Plan:  Continue heparin drip at current rate Recheck level and PTT at 1400  [address] [place] 02/17/2018,7:07 AM

## 2018-02-17 NOTE — Progress Notes (Signed)
  Echocardiogram 2D Echocardiogram has been performed.  Darlina Sicilian M 02/17/2018, 3:15 PM
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
## 2018-02-17 NOTE — Progress Notes (Signed)
PROGRESS NOTE    Willie Dominguez  DTO:[id number] DOB: [birth year] DOA: 02/16/2018 PCP: [name], [name], MD    Brief Narrative:  67 y.o. male with medical history significant for HTN, HLD, thrombocytopenia, and BPH who presents to the ED from his PCP office for DVT.  Patient reports developing left lower extremity pain behind his knee 5 days ago.  He developed some shortness of breath and nonproductive cough.  He noticed swelling in his left leg today and went to his PCP office for further evaluation.  D-dimer was obtained and >35.00.  He was given a sample of Xarelto which she took around 12 PM and had bilateral lower extremity Dopplers completed which showed acute DVT at the right peroneal vein and acute DVT at the left proximal femoral vein, left gastrocnemius vein, left popliteal vein, left posterior tibial vein, and left peroneal vein.  Given his symptoms of shortness of breath CTA chest was ordered which she had completed at [facility].  CTA was positive for acute bilateral pulmonary emboli with evidence of right heart strain consistent with at least submassive PE.  He was subsequently sent to the ED.  He denies any chest pain, palpitations, lightheadedness, dizziness, abdominal pain.  He denies any obvious bleeding including epistaxis, hemoptysis, hematemesis, hematuria, hematochezia, or melena.  He reports driving about 4 hours total daily and sits at a desk during work.  He denies any personal or family history of blood clots as far as he knows.  He denies any history of tobacco use.  He denies any recent airplane travel or surgery.  ED Course:  Initial vitals in the ED showed BP 140/89, pulse 73, RR 16, temp 98.8 Fahrenheit, SPO2 97% on room air.  Labs are notable for WBC 6.6, hemoglobin 12.8, platelets 120 (140 one year ago).  Chemistry panel was largely unremarkable.  He is started on IV heparin and the hospitalist service was consulted for admission.  Assessment & Plan:     Principal Problem:   Pulmonary embolism, bilateral (HCC) Active Problems:   Dyslipidemia   Essential hypertension   Thrombocytopenia (HCC)  Acute bilateral pulmonary emboli with CT evidence of prior strain/bilateral lower extremity DVTs Suspect underlying sedentary lifestyle (daily long distance driving, working at desk).  He denies any chest pain.  He is currently hemodynamically stable and oxygenating well on room air. -2d echo performed, pending results -Monitor for signs/symptoms of bleeding -Continued on heparin gtt x <24hrs thus far. Would continue heparin gtt x at least 24 hrs and if stable afterwards, consider transition to xarelto  Thrombocytopenia: This is been noted to have mild thrombocytopenia in the past.  He denies any bleeding.  Platelets 120k on admission. -Repeat CBC in AM  Hypertension: -Remains stable at this time -Resume home valsartan (irbesartan on formulary)  Hyperlipidemia: -Prescribed Crestor, however has not taken recently.   -Reports intolerance to atorvastatin in the past. -Stable currently   DVT prophylaxis: Heparin gtt Code Status: Full Family Communication: Pt in room, family at bedside Disposition Plan: Possible home in 24-48hrs  Consultants:     Procedures:     Antimicrobials: Anti-infectives (From admission, onward)   None       Subjective: Denies sob. Still with BLE swelling L>R  Objective: Vitals:   02/17/18 1400 02/17/18 1430 02/17/18 1524 02/17/18 1534  BP: 125/75 124/79 (!) 156/93   Pulse: (!) 56 (!) 54 61   Resp: 14 14 18    Temp:   97.9 F (36.6 C)   TempSrc:  Oral   SpO2: 95% 92% 95%   Weight:    104 kg  Height:    6' (1.829 m)    Intake/Output Summary (Last 24 hours) at 02/17/2018 1557 Last data filed at 02/17/2018 1138 Gross per 24 hour  Intake 256.2 ml  Output -  Net 256.2 ml   Filed Weights   02/16/18 1751 02/17/18 1534  Weight: 106.3 kg 104 kg    Examination:  General exam: Appears calm  and comfortable  Respiratory system: Clear to auscultation. Respiratory effort normal. Cardiovascular system: S1 & S2 heard, RRR Gastrointestinal system: Abdomen is nondistended, soft and nontender. No organomegaly or masses felt. Normal bowel sounds heard. Central nervous system: Alert and oriented. No focal neurological deficits. Extremities: Symmetric 5 x 5 power. BLE edema, L>R Skin: No rashes, lesions Psychiatry: Judgement and insight appear normal. Mood & affect appropriate.   Data Reviewed: I have personally reviewed following labs and imaging studies  CBC: Recent Labs  Lab 02/16/18 1232 02/16/18 1838 02/17/18 0607  WBC 6.8 6.6 5.5  NEUTROABS  --  4.2  --   HGB 14.3 12.8* 12.2*  HCT 42.2 40.7 38.4*  MCV 91.3 96.9 95.3  PLT 131.0* 120* 458*   Basic Metabolic Panel: Recent Labs  Lab 02/16/18 1232 02/16/18 1838 02/17/18 0607  NA 140 140 138  K 5.0 4.3 3.8  CL 104 105 105  CO2 29 26 26   GLUCOSE 114* 102* 111*  BUN 20 18 18   CREATININE 1.16 1.13 1.13  CALCIUM 9.7 9.0 8.7*   GFR: Estimated Creatinine Clearance: 79.1 mL/min (by C-G formula based on SCr of 1.13 mg/dL). Liver Function Tests: Recent Labs  Lab 02/16/18 1232  AST 16  ALT 17  ALKPHOS 103  BILITOT 0.7  PROT 7.2  ALBUMIN 4.3   No results for input(s): LIPASE, AMYLASE in the last 168 hours. No results for input(s): AMMONIA in the last 168 hours. Coagulation Profile: Recent Labs  Lab 02/16/18 1838  INR 1.70   Cardiac Enzymes: No results for input(s): CKTOTAL, CKMB, CKMBINDEX, TROPONINI in the last 168 hours. BNP (last 3 results) No results for input(s): PROBNP in the last 8760 hours. HbA1C: No results for input(s): HGBA1C in the last 72 hours. CBG: No results for input(s): GLUCAP in the last 168 hours. Lipid Profile: No results for input(s): CHOL, HDL, LDLCALC, TRIG, CHOLHDL, LDLDIRECT in the last 72 hours. Thyroid Function Tests: No results for input(s): TSH, T4TOTAL, FREET4, T3FREE,  THYROIDAB in the last 72 hours. Anemia Panel: No results for input(s): VITAMINB12, FOLATE, FERRITIN, TIBC, IRON, RETICCTPCT in the last 72 hours. Sepsis Labs: No results for input(s): PROCALCITON, LATICACIDVEN in the last 168 hours.  No results found for this or any previous visit (from the past 240 hour(s)).   Radiology Studies: Ct Angio Chest W/cm &/or Wo Cm  Addendum Date: 02/16/2018   ADDENDUM REPORT: 02/16/2018 17:25 ADDENDUM: These results were called by telephone at the time of interpretation on 02/16/2018 at 5:22 pm to Dr. [name] , who verbally acknowledged these results. Electronically Signed   By: Markus Daft M.D.   On: 02/16/2018 17:25   Result Date: 02/16/2018 CLINICAL DATA:  Recently diagnosed DVT. Dry cough and shortness of breath. EXAM: CT ANGIOGRAPHY CHEST WITH CONTRAST TECHNIQUE: Multidetector CT imaging of the chest was performed using the standard protocol during bolus administration of intravenous contrast. Multiplanar CT image reconstructions and MIPs were obtained to evaluate the vascular anatomy. CONTRAST:  164mL ISOVUE-370 IOPAMIDOL (ISOVUE-370) INJECTION 76% COMPARISON:  Chest radiograph 01/17/2014 FINDINGS: Cardiovascular: Positive for bilateral pulmonary emboli. There is clot in the distal right pulmonary artery extending into the right upper lobe, right middle lobe and right lower lobe branches. Clot extending into the segmental branches of the left lower lobe. Small amount of thrombus in the left upper lobe and the lingular region. There is enlargement of the right ventricle and there may be some bowing of the interventricular septum. The RV : LV ratio is roughly 1.3. No significant pericardial fluid. Mediastinum/Nodes: No mediastinal or hilar lymph node enlargement. Esophagus is unremarkable. Lungs/Pleura: Trachea and mainstem bronchi are patent. No large pleural effusions. 8 mm calcified nodule in the medial left upper lobe. No significant airspace disease or lung  consolidation. Upper Abdomen: Images of the upper abdomen are unremarkable. Musculoskeletal: No acute bone abnormality. Review of the MIP images confirms the above findings. IMPRESSION: Positive for bilateral pulmonary emboli. Positive for acute PE with CT evidence of right heart strain (RV/LV Ratio = 1.3) consistent with at least submassive (intermediate risk) PE. The presence of right heart strain has been associated with an increased risk of morbidity and mortality. Electronically Signed: By: Markus Daft M.D. On: 02/16/2018 17:18   Vas [place] Lower Extremity Venous (dvt)  Result Date: 02/16/2018  Lower Venous Study Indications: Pain posterior left knee that started 5 days ago. Patient states the pain has progressed and is now in his left calf. The left lower leg is also warm to the touch. He has a one year history of SOB but he states it has gotten worse since 02/12/2018.  Anticoagulation: Xarelto.  Examination Guidelines: A complete evaluation includes B-mode imaging, spectral Doppler, color Doppler, and power Doppler as needed of all accessible portions of each vessel. Bilateral testing is considered an integral part of a complete examination. Limited examinations for reoccurring indications may be performed as noted.  Right Venous Findings: +---------+---------------+---------+-----------+----------------+-------+          CompressibilityPhasicitySpontaneityProperties      Summary +---------+---------------+---------+-----------+----------------+-------+ CFV      Full           Yes      Yes                                +---------+---------------+---------+-----------+----------------+-------+ SFJ      Full           Yes      Yes                                +---------+---------------+---------+-----------+----------------+-------+ FV Prox  Full           Yes      Yes                                +---------+---------------+---------+-----------+----------------+-------+ FV Mid    Full           Yes      Yes                                +---------+---------------+---------+-----------+----------------+-------+ FV DistalFull           Yes      Yes                                +---------+---------------+---------+-----------+----------------+-------+  PFV      Full           Yes      Yes                                +---------+---------------+---------+-----------+----------------+-------+ POP      Full           Yes      Yes                                +---------+---------------+---------+-----------+----------------+-------+ PTV      Full           Yes      Yes                                +---------+---------------+---------+-----------+----------------+-------+ PERO     None           No       No         softly echogenicAcute   +---------+---------------+---------+-----------+----------------+-------+ Gastroc  Full                                                       +---------+---------------+---------+-----------+----------------+-------+ GSV      Full           Yes      Yes                                +---------+---------------+---------+-----------+----------------+-------+ There is no compression and absent of flow noted in the mid peroneal veins.  Left Venous Findings: +---------+---------------+---------+-----------+----------------------+-------+          CompressibilityPhasicitySpontaneityProperties            Summary +---------+---------------+---------+-----------+----------------------+-------+ CFV      Full           Yes      Yes                                      +---------+---------------+---------+-----------+----------------------+-------+ SFJ      Full           Yes      Yes                                      +---------+---------------+---------+-----------+----------------------+-------+ FV Prox  Full           Yes      Yes        softly echogenic      Acute                                                behind the venous  valves                        +---------+---------------+---------+-----------+----------------------+-------+ FV Mid   Full           Yes      Yes                                      +---------+---------------+---------+-----------+----------------------+-------+ FV DistalFull           Yes      Yes                                      +---------+---------------+---------+-----------+----------------------+-------+ PFV      Full           Yes      Yes                                      +---------+---------------+---------+-----------+----------------------+-------+ POP      None           No       No         softly echogenic      Acute   +---------+---------------+---------+-----------+----------------------+-------+ PTV      None           No       No         softly echogenic      Acute   +---------+---------------+---------+-----------+----------------------+-------+ PERO     Full           No       No         softly echogenic      Acute   +---------+---------------+---------+-----------+----------------------+-------+ Gastroc  None           No       No         softly echogenic      Acute   +---------+---------------+---------+-----------+----------------------+-------+ GSV      Full           Yes      Yes                                      +---------+---------------+---------+-----------+----------------------+-------+ There is acute thrombus accumulating behind the venous valves in the proximal femoral vein; full compression noted with normal flow. There is no compression and absent of flow noted in the tibioperoneal trunk, gastrocnemius veins, popliteal vein, proximal and mid PTVs and proximal and mid peroneal veins. Distal peroneal veins not visualized.  Findings reported to [facility] at 3:20 pm. Patient was instructed to wait in  the lobby here at [facility] to see where he will be going for a CTA.  Summary: Right: Findings consistent with acute deep vein thrombosis involving the right peroneal vein. Left: Findings consistent with acute deep vein thrombosis involving the left proximal femoral vein, left gastrocnemius vein, left popliteal vein, left posterior tibial vein, and left peroneal vein.  *See table(s) above for measurements and observations. Electronically signed by Jenkins Rouge MD on 02/16/2018 at 4:44:46 PM.    Final     Scheduled Meds: . irbesartan  75 mg Oral Daily  .  sodium chloride flush  3 mL Intravenous Q12H   Continuous Infusions: . heparin 1,700 Units/hr (02/17/18 1138)     LOS: 0 days   Marylu Lund, MD [facility] Pager On Amion  If 7PM-7AM, please contact night-coverage 02/17/2018, 3:57 PM

## 2018-02-18 LAB — CBC
HCT: 39.7 % (ref 39.0–52.0)
Hemoglobin: 12.8 g/dL — ABNORMAL LOW (ref 13.0–17.0)
MCH: 30.3 pg (ref 26.0–34.0)
MCHC: 32.2 g/dL (ref 30.0–36.0)
MCV: 94.1 fL (ref 80.0–100.0)
Platelets: 121 10*3/uL — ABNORMAL LOW (ref 150–400)
RBC: 4.22 MIL/uL (ref 4.22–5.81)
RDW: 12.7 % (ref 11.5–15.5)
WBC: 5 10*3/uL (ref 4.0–10.5)
nRBC: 0 % (ref 0.0–0.2)

## 2018-02-18 LAB — HEPARIN LEVEL (UNFRACTIONATED): Heparin Unfractionated: 0.56 IU/mL (ref 0.30–0.70)

## 2018-02-18 MED ORDER — RIVAROXABAN 15 MG PO TABS
15.0000 mg | ORAL_TABLET | Freq: Two times a day (BID) | ORAL | Status: DC
  Administered 2018-02-18: 15 mg via ORAL
  Filled 2018-02-18: qty 1

## 2018-02-18 MED ORDER — PNEUMOCOCCAL VAC POLYVALENT 25 MCG/0.5ML IJ INJ
0.5000 mL | INJECTION | INTRAMUSCULAR | Status: AC
  Administered 2018-02-18: 0.5 mL via INTRAMUSCULAR
  Filled 2018-02-18: qty 0.5

## 2018-02-18 MED ORDER — RIVAROXABAN (XARELTO) VTE STARTER PACK (15 & 20 MG): ORAL_TABLET | ORAL | 0 refills | Status: DC

## 2018-02-18 MED ORDER — INFLUENZA VAC SPLIT HIGH-DOSE 0.5 ML IM SUSY: 0.5000 mL | PREFILLED_SYRINGE | INTRAMUSCULAR | Status: DC

## 2018-02-18 MED ORDER — INFLUENZA VAC SPLIT HIGH-DOSE 0.5 ML IM SUSY
0.5000 mL | PREFILLED_SYRINGE | INTRAMUSCULAR | Status: AC
  Administered 2018-02-18: 0.5 mL via INTRAMUSCULAR
  Filled 2018-02-18: qty 0.5

## 2018-02-18 MED ORDER — RIVAROXABAN 20 MG PO TABS: 20.0000 mg | ORAL_TABLET | Freq: Every day | ORAL | Status: DC

## 2018-02-18 MED ORDER — PNEUMOCOCCAL VAC POLYVALENT 25 MCG/0.5ML IJ INJ: 0.5000 mL | INJECTION | INTRAMUSCULAR | Status: DC

## 2018-02-18 NOTE — Progress Notes (Signed)
ANTICOAGULATION CONSULT NOTE - Follow Up Consult  Pharmacy Consult for Heparin Indication: pulmonary embolus and DVT  Allergies  Allergen Reactions  . Methotrexate Derivatives Other (See Comments)    Mouth ulcers  . Doxazosin     REACTION: aggrivated prostate, weakend stream, bladder pain and irritation Other reaction(s): Other (See Comments) Difficulty urinating  . Lipitor [Atorvastatin] Rash  . Lisinopril     REACTION: Intolerance.  Depressive mood, fatigue  . Penicillins Rash    REACTION: rash DID THE REACTION INVOLVE: Swelling of the face/tongue/throat, SOB, or low BP? Y Sudden or severe rash/hives, skin peeling, or the inside of the mouth or nose? Y Did it require medical treatment? N When did it last happen? 1970 If all above answers are "NO", may proceed with cephalosporin use.   [name] Bystolic [Nebivolol Hcl]     rash  . Simvastatin     REACTION: rash    Patient Measurements: Height: 6' (182.9 cm) Weight: 229 lb 4.8 oz (104 kg) IBW/kg (Calculated) : 77.6 Heparin Dosing Weight:   Vital Signs: Temp: 97.7 F (36.5 C) (12/19 2011) Temp Source: Oral (12/19 2011) BP: 122/77 (12/19 2011) Pulse Rate: 62 (12/19 2011)  Labs: Recent Labs    02/16/18 1232  02/16/18 1838 02/17/18 0607 02/17/18 1400 02/17/18 2204  HGB 14.3  --  12.8* 12.2*  --   --   HCT 42.2  --  40.7 38.4*  --   --   PLT 131.0*  --  120* 111*  --   --   APTT  --    < > 37* 87* 104* 81*  LABPROT  --   --  19.8*  --   --   --   INR  --   --  1.70  --   --   --   HEPARINUNFRC  --    < > >2.20* 1.32* 0.79* 0.65  CREATININE 1.16  --  1.13 1.13  --   --    < > = values in this interval not displayed.    Estimated Creatinine Clearance: 79.1 mL/min (by C-G formula based on SCr of 1.13 mg/dL).   Medications:  Infusions:  . heparin 1,600 Units/hr (02/18/18 0239)    Assessment: Patient with PTT and Heparin level both at goal.  PTT ordered with Heparin level until both correlate due to possible  drug-lab interaction between oral anticoagulant (rivaroxaban, edoxaban, or apixaban) and anti-Xa level (aka heparin level).  Appears the two labs are correlating at this time, therefore will only use heparin levels going forward.  Goal of Therapy:  Heparin level 0.3-0.7 units/ml Monitor platelets by anticoagulation protocol: Yes   Plan:  Continue heparin drip at current rate Recheck level at 0800  Tyler Deis, Oneida Crowford 02/18/2018,4:29 AM

## 2018-02-18 NOTE — Plan of Care (Signed)
  Problem: Education: Goal: Knowledge of General Education information will improve Description Including pain rating scale, medication(s)/side effects and non-pharmacologic comfort measures Outcome: Completed/Met   Problem: Health Behavior/Discharge Planning: Goal: Ability to manage health-related needs will improve Outcome: Completed/Met   Problem: Clinical Measurements: Goal: Will remain free from infection Outcome: Completed/Met Goal: Respiratory complications will improve Outcome: Completed/Met   Problem: Activity: Goal: Risk for activity intolerance will decrease Outcome: Completed/Met   Problem: Nutrition: Goal: Adequate nutrition will be maintained Outcome: Completed/Met

## 2018-02-18 NOTE — Care Management Note (Signed)
Case Management Note  Patient Details  Name: Willie Dominguez MRN: [id number] Date of Birth: [birth year]  Subjective/Objective: Benefit checked-xarelto $100,no prior auth needed. Patient voiced understanding.No further CM needs.                   Action/Plan:dc home.   Expected Discharge Date:  02/18/18               Expected Discharge Plan:  Home/Self Care  In-House Referral:     Discharge planning Services  CM Consult, Other - See comment(Benefit check xarelto)  Post Acute Care Choice:    Choice offered to:     DME Arranged:    DME Agency:     HH Arranged:    HH Agency:     Status of Service:  Completed, signed off  If discussed at [employer] of Stay Meetings, dates discussed:    Additional Comments:  Dessa Phi, RN 02/18/2018, 11:49 AM

## 2018-02-18 NOTE — Discharge Instructions (Signed)
Information on my medicine - XARELTO (rivaroxaban)  This medication education was reviewed with me or my healthcare representative as part of my discharge preparation.  The pharmacist that spoke with me during my hospital stay was:  Biagio Borg, [place]? Xarelto was prescribed to treat blood clots that may have been found in the veins of your legs (deep vein thrombosis) or in your lungs (pulmonary embolism) and to reduce the risk of them occurring again.  What do you need to know about Xarelto? The starting dose is one 15 mg tablet taken TWICE daily with food for the FIRST 21 DAYS then on 03/11/18  the dose is changed to one 20 mg tablet taken ONCE A DAY with your evening meal.  DO NOT stop taking Xarelto without talking to the health care provider who prescribed the medication.  Refill your prescription for 20 mg tablets before you run out.  After discharge, you should have regular check-up appointments with your healthcare provider that is prescribing your Xarelto.  In the future your dose may need to be changed if your kidney function changes by a significant amount.  What do you do if you miss a dose? If you are taking Xarelto TWICE DAILY and you miss a dose, take it as soon as you remember. You may take two 15 mg tablets (total 30 mg) at the same time then resume your regularly scheduled 15 mg twice daily the next day.  If you are taking Xarelto ONCE DAILY and you miss a dose, take it as soon as you remember on the same day then continue your regularly scheduled once daily regimen the next day. Do not take two doses of Xarelto at the same time.   Important Safety Information Xarelto is a blood thinner medicine that can cause bleeding. You should call your healthcare provider right away if you experience any of the following: ? Bleeding from an injury or your nose that does not stop. ? Unusual colored urine (red or dark brown) or  unusual colored stools (red or black). ? Unusual bruising for unknown reasons. ? A serious fall or if you hit your head (even if there is no bleeding).  Some medicines may interact with Xarelto and might increase your risk of bleeding while on Xarelto. To help avoid this, consult your healthcare provider or pharmacist prior to using any new prescription or non-prescription medications, including herbals, vitamins, non-steroidal anti-inflammatory drugs (NSAIDs) and supplements.  This website has more information on Xarelto: [email or web address]

## 2018-02-18 NOTE — Care Management Note (Signed)
Case Management Note  Patient Details  Name: KSHAWN CANAL MRN: [id number] Date of Birth: 1950/12/26  Subjective/Objective:                    Action/Plan:   Expected Discharge Date:  02/18/18               Expected Discharge Plan:  Home/Self Care  In-House Referral:     Discharge planning Services  CM Consult, Other - See comment(Benefit check xarelto)  Post Acute Care Choice:    Choice offered to:     DME Arranged:    DME Agency:     HH Arranged:    HH Agency:     Status of Service:  Completed, signed off  If discussed at [employer] of Stay Meetings, dates discussed:    Additional Comments: Per [facility] p#[id number] Xalexto Is covered co pay $100.00 Rivaroxaban not on formulary no prior auth needed  Kerin Salen 02/18/2018, 10:08 AM

## 2018-02-18 NOTE — Care Management Note (Signed)
Case Management Note  Patient Details  Name: NIJAH ORLICH MRN: [id number] Date of Birth: 1951-01-09  Subjective/Objective: PE. From home. Benefit check for xarelto-await response.                    Action/Plan:dc home.   Expected Discharge Date:  (unknown)               Expected Discharge Plan:  Home/Self Care  In-House Referral:     Discharge planning Services  CM Consult, Other - See comment(Benefit check xarelto)  Post Acute Care Choice:    Choice offered to:     DME Arranged:    DME Agency:     HH Arranged:    HH Agency:     Status of Service:  Completed, signed off  If discussed at [employer] of Stay Meetings, dates discussed:    Additional Comments:  Dessa Phi, RN 02/18/2018, 9:24 AM

## 2018-02-18 NOTE — Discharge Summary (Signed)
Physician Discharge Summary  Willie Dominguez ZJI:[id number] DOB: [birth year] DOA: 02/16/2018  PCP: [name], [name], MD  Admit date: 02/16/2018 Discharge date: 02/18/2018  Admitted From: Home Disposition:  Home  Recommendations for Outpatient Follow-up:  1. Follow up with PCP in 1-2 weeks  Discharge Condition:Stable CODE STATUS:Full Diet recommendation: Regular   Brief/Interim Summary: 67 y.o.malewith medical history significant forHTN, HLD, thrombocytopenia, and BPHwho presents to the ED from his PCP office for DVT. Patient reports developing left lower extremity pain behind his knee 5 days ago. He developed some shortness of breath and nonproductive cough. He noticed swelling in his left leg today and went to his PCP office for further evaluation. D-dimer was obtained and >35.00.He was given a sample of Xarelto which she took around 12 PM and had bilateral lower extremity Dopplers completedwhich showed acute DVT at the right peroneal vein and acute DVT at the left proximal femoral vein, left gastrocnemius vein, left popliteal vein, left posterior tibial vein, and left peroneal vein. Given his symptoms of shortness of breath CTA chest was ordered which she had completed at [facility]. CTA was positive for acute bilateral pulmonary emboli with evidence of right heart strain consistent with at least submassive PE. He was subsequently sent to the ED. He denies any chest pain, palpitations, lightheadedness, dizziness, abdominal pain. He denies any obvious bleeding including epistaxis, hemoptysis, hematemesis, hematuria, hematochezia, or melena.  Discharge Diagnoses:  Principal Problem:   Pulmonary embolism, bilateral ([place]) Active Problems:   Dyslipidemia   Essential hypertension   Thrombocytopenia (HCC)   Bilateral pulmonary embolism (HCC)  Acute bilateral pulmonary emboli with CT evidence of prior strain/bilateral lower extremity DVTs Suspect underlying sedentary  lifestyle(daily long distance driving, working at desk). He denies any chest pain. He is currently hemodynamically stable and oxygenating well on room air. -2d echo performed, pending results -Monitor for signs/symptoms of bleeding -Pt was continued on heparin gtt, tolerated regimen -transitioned to PO xarelto on discharge  Thrombocytopenia: This is been noted to have mild thrombocytopenia in the past. He denies any bleeding. Platelets 120kon admission. -no evidence of bleeding  Hypertension: -Remains stable at this time -Resumed home valsartan (irbesartan on formulary)  Hyperlipidemia: -Prescribed Crestor, however has not taken recently.  -Reports intolerance to atorvastatin in the past. -Stable currently  Discharge Instructions   Allergies as of 02/18/2018      Reactions   Methotrexate Derivatives Other (See Comments)   Mouth ulcers   Doxazosin    REACTION: aggrivated prostate, weakend stream, bladder pain and irritation Other reaction(s): Other (See Comments) Difficulty urinating   Lipitor [atorvastatin] Rash   Lisinopril    REACTION: Intolerance.  Depressive mood, fatigue   Penicillins Rash   REACTION: rash DID THE REACTION INVOLVE: Swelling of the face/tongue/throat, SOB, or low BP? Y Sudden or severe rash/hives, skin peeling, or the inside of the mouth or nose? Y Did it require medical treatment? N When did it last happen? 1970 If all above answers are "NO", may proceed with cephalosporin use.   Bystolic [nebivolol Hcl]    rash   Simvastatin    REACTION: rash      Medication List    STOP taking these medications   metFORMIN 500 MG 24 hr tablet Commonly known as:  GLUCOPHAGE-XR   Rivaroxaban 15 MG Tabs tablet Commonly known as:  XARELTO Replaced by:  Rivaroxaban 15 & 20 MG Tbpk   rosuvastatin 10 MG tablet Commonly known as:  CRESTOR   tamsulosin 0.4 MG Caps capsule Commonly  known as:  FLOMAX     TAKE these medications   Rivaroxaban 15 &  20 MG Tbpk Take as directed on package: Start with one 15mg  tablet by mouth twice a day with food. On Day 22, switch to one 20mg  tablet once a day with food. Replaces:  Rivaroxaban 15 MG Tabs tablet   valsartan 80 MG tablet Commonly known as:  DIOVAN Take 1 tablet (80 mg total) by mouth daily.      Follow-up Information    [name], [name], MD. Schedule an appointment as soon as possible for a visit in 1 week(s).   Specialty:  Family Medicine Contact information: [address] [place] [address] [phone]          Allergies  Allergen Reactions  . Methotrexate Derivatives Other (See Comments)    Mouth ulcers  . Doxazosin     REACTION: aggrivated prostate, weakend stream, bladder pain and irritation Other reaction(s): Other (See Comments) Difficulty urinating  . Lipitor [Atorvastatin] Rash  . Lisinopril     REACTION: Intolerance.  Depressive mood, fatigue  . Penicillins Rash    REACTION: rash DID THE REACTION INVOLVE: Swelling of the face/tongue/throat, SOB, or low BP? Y Sudden or severe rash/hives, skin peeling, or the inside of the mouth or nose? Y Did it require medical treatment? N When did it last happen? 1970 If all above answers are "NO", may proceed with cephalosporin use.   [name] Bystolic [Nebivolol Hcl]     rash  . Simvastatin     REACTION: rash    Procedures/Studies: Ct Angio Chest W/cm &/or Wo Cm  Addendum Date: 02/16/2018   ADDENDUM REPORT: 02/16/2018 17:25 ADDENDUM: These results were called by telephone at the time of interpretation on 02/16/2018 at 5:22 pm to Dr. [name] , who verbally acknowledged these results. Electronically Signed   By: Markus Daft M.D.   On: 02/16/2018 17:25   Result Date: 02/16/2018 CLINICAL DATA:  Recently diagnosed DVT. Dry cough and shortness of breath. EXAM: CT ANGIOGRAPHY CHEST WITH CONTRAST TECHNIQUE: Multidetector CT imaging of the chest was performed using the standard protocol during bolus  administration of intravenous contrast. Multiplanar CT image reconstructions and MIPs were obtained to evaluate the vascular anatomy. CONTRAST:  110mL ISOVUE-370 IOPAMIDOL (ISOVUE-370) INJECTION 76% COMPARISON:  Chest radiograph 01/17/2014 FINDINGS: Cardiovascular: Positive for bilateral pulmonary emboli. There is clot in the distal right pulmonary artery extending into the right upper lobe, right middle lobe and right lower lobe branches. Clot extending into the segmental branches of the left lower lobe. Small amount of thrombus in the left upper lobe and the lingular region. There is enlargement of the right ventricle and there may be some bowing of the interventricular septum. The RV : LV ratio is roughly 1.3. No significant pericardial fluid. Mediastinum/Nodes: No mediastinal or hilar lymph node enlargement. Esophagus is unremarkable. Lungs/Pleura: Trachea and mainstem bronchi are patent. No large pleural effusions. 8 mm calcified nodule in the medial left upper lobe. No significant airspace disease or lung consolidation. Upper Abdomen: Images of the upper abdomen are unremarkable. Musculoskeletal: No acute bone abnormality. Review of the MIP images confirms the above findings. IMPRESSION: Positive for bilateral pulmonary emboli. Positive for acute PE with CT evidence of right heart strain (RV/LV Ratio = 1.3) consistent with at least submassive (intermediate risk) PE. The presence of right heart strain has been associated with an increased risk of morbidity and mortality. Electronically Signed: By: Markus Daft M.D. On: 02/16/2018 17:18  Vas [place] Lower Extremity Venous (dvt)  Result Date: 02/16/2018  Lower Venous Study Indications: Pain posterior left knee that started 5 days ago. Patient states the pain has progressed and is now in his left calf. The left lower leg is also warm to the touch. He has a one year history of SOB but he states it has gotten worse since 02/12/2018.  Anticoagulation: Xarelto.   Examination Guidelines: A complete evaluation includes B-mode imaging, spectral Doppler, color Doppler, and power Doppler as needed of all accessible portions of each vessel. Bilateral testing is considered an integral part of a complete examination. Limited examinations for reoccurring indications may be performed as noted.  Right Venous Findings: +---------+---------------+---------+-----------+----------------+-------+          CompressibilityPhasicitySpontaneityProperties      Summary +---------+---------------+---------+-----------+----------------+-------+ CFV      Full           Yes      Yes                                +---------+---------------+---------+-----------+----------------+-------+ SFJ      Full           Yes      Yes                                +---------+---------------+---------+-----------+----------------+-------+ FV Prox  Full           Yes      Yes                                +---------+---------------+---------+-----------+----------------+-------+ FV Mid   Full           Yes      Yes                                +---------+---------------+---------+-----------+----------------+-------+ FV DistalFull           Yes      Yes                                +---------+---------------+---------+-----------+----------------+-------+ PFV      Full           Yes      Yes                                +---------+---------------+---------+-----------+----------------+-------+ POP      Full           Yes      Yes                                +---------+---------------+---------+-----------+----------------+-------+ PTV      Full           Yes      Yes                                +---------+---------------+---------+-----------+----------------+-------+ PERO     None           No       No  softly echogenicAcute   +---------+---------------+---------+-----------+----------------+-------+ Gastroc  Full                                                        +---------+---------------+---------+-----------+----------------+-------+ GSV      Full           Yes      Yes                                +---------+---------------+---------+-----------+----------------+-------+ There is no compression and absent of flow noted in the mid peroneal veins.  Left Venous Findings: +---------+---------------+---------+-----------+----------------------+-------+          CompressibilityPhasicitySpontaneityProperties            Summary +---------+---------------+---------+-----------+----------------------+-------+ CFV      Full           Yes      Yes                                      +---------+---------------+---------+-----------+----------------------+-------+ SFJ      Full           Yes      Yes                                      +---------+---------------+---------+-----------+----------------------+-------+ FV Prox  Full           Yes      Yes        softly echogenic      Acute                                               behind the venous                                                         valves                        +---------+---------------+---------+-----------+----------------------+-------+ FV Mid   Full           Yes      Yes                                      +---------+---------------+---------+-----------+----------------------+-------+ FV DistalFull           Yes      Yes                                      +---------+---------------+---------+-----------+----------------------+-------+ PFV      Full           Yes      Yes                                      +---------+---------------+---------+-----------+----------------------+-------+  POP      None           No       No         softly echogenic      Acute   +---------+---------------+---------+-----------+----------------------+-------+ PTV      None           No        No         softly echogenic      Acute   +---------+---------------+---------+-----------+----------------------+-------+ PERO     Full           No       No         softly echogenic      Acute   +---------+---------------+---------+-----------+----------------------+-------+ Gastroc  None           No       No         softly echogenic      Acute   +---------+---------------+---------+-----------+----------------------+-------+ GSV      Full           Yes      Yes                                      +---------+---------------+---------+-----------+----------------------+-------+ There is acute thrombus accumulating behind the venous valves in the proximal femoral vein; full compression noted with normal flow. There is no compression and absent of flow noted in the tibioperoneal trunk, gastrocnemius veins, popliteal vein, proximal and mid PTVs and proximal and mid peroneal veins. Distal peroneal veins not visualized.  Findings reported to [facility] at 3:20 pm. Patient was instructed to wait in the lobby here at [facility] to see where he will be going for a CTA.  Summary: Right: Findings consistent with acute deep vein thrombosis involving the right peroneal vein. Left: Findings consistent with acute deep vein thrombosis involving the left proximal femoral vein, left gastrocnemius vein, left popliteal vein, left posterior tibial vein, and left peroneal vein.  *See table(s) above for measurements and observations. Electronically signed by Jenkins Rouge MD on 02/16/2018 at 4:44:46 PM.    Final      Subjective: Eager to go home  Discharge Exam: Vitals:   02/17/18 2011 02/18/18 0531  BP: 122/77 132/79  Pulse: 62 (!) 59  Resp: 18 18  Temp: 97.7 F (36.5 C) 97.7 F (36.5 C)  SpO2: 96% 95%   Vitals:   02/17/18 1534 02/17/18 2011 02/18/18 0531 02/18/18 0649  BP:  122/77 132/79   Pulse:  62 (!) 59   Resp:  18 18   Temp:  97.7 F (36.5 C) 97.7 F (36.5 C)   TempSrc:  Oral Oral    SpO2:  96% 95%   Weight: 104 kg   103.8 kg  Height: 6' (1.829 m)       General: Pt is alert, awake, not in acute distress Cardiovascular: RRR, S1/S2 +, no rubs, no gallops Respiratory: CTA bilaterally, no wheezing, no rhonchi Abdominal: Soft, NT, ND, bowel sounds + Extremities: no edema, no cyanosis   The results of significant diagnostics from this hospitalization (including imaging, microbiology, ancillary and laboratory) are listed below for reference.     Microbiology: No results found for this or any previous visit (from the past 240 hour(s)).   Labs: BNP (last 3 results) No results for input(s): BNP in the last 8760 hours. Basic Metabolic Panel:  Recent Labs  Lab 02/16/18 1232 02/16/18 1838 02/17/18 0607  NA 140 140 138  K 5.0 4.3 3.8  CL 104 105 105  CO2 29 26 26   GLUCOSE 114* 102* 111*  BUN 20 18 18   CREATININE 1.16 1.13 1.13  CALCIUM 9.7 9.0 8.7*   Liver Function Tests: Recent Labs  Lab 02/16/18 1232  AST 16  ALT 17  ALKPHOS 103  BILITOT 0.7  PROT 7.2  ALBUMIN 4.3   No results for input(s): LIPASE, AMYLASE in the last 168 hours. No results for input(s): AMMONIA in the last 168 hours. CBC: Recent Labs  Lab 02/16/18 1232 02/16/18 1838 02/17/18 0607 02/18/18 0803  WBC 6.8 6.6 5.5 5.0  NEUTROABS  --  4.2  --   --   HGB 14.3 12.8* 12.2* 12.8*  HCT 42.2 40.7 38.4* 39.7  MCV 91.3 96.9 95.3 94.1  PLT 131.0* 120* 111* 121*   Cardiac Enzymes: No results for input(s): CKTOTAL, CKMB, CKMBINDEX, TROPONINI in the last 168 hours. BNP: Invalid input(s): POCBNP CBG: No results for input(s): GLUCAP in the last 168 hours. D-Dimer Recent Labs    02/16/18 1232  DDIMER >35.00*   Hgb A1c No results for input(s): HGBA1C in the last 72 hours. Lipid Profile No results for input(s): CHOL, HDL, LDLCALC, TRIG, CHOLHDL, LDLDIRECT in the last 72 hours. Thyroid function studies No results for input(s): TSH, T4TOTAL, T3FREE, THYROIDAB in the last 72  hours.  Invalid input(s): FREET3 Anemia work up No results for input(s): VITAMINB12, FOLATE, FERRITIN, TIBC, IRON, RETICCTPCT in the last 72 hours. Urinalysis    Component Value Date/Time   COLORURINE yellow 08/09/2008 0933   APPEARANCEUR Clear 08/09/2008 0933   LABSPEC >=1.030 08/09/2008 0933   PHURINE 5.0 08/09/2008 0933   HGBUR negative 08/09/2008 0933   BILIRUBINUR neg 01/01/2016 1038   PROTEINUR + 01/01/2016 1038   UROBILINOGEN 0.2 01/01/2016 1038   UROBILINOGEN 0.2 08/09/2008 0933   NITRITE neg 01/01/2016 1038   NITRITE negative 08/09/2008 0933   LEUKOCYTESUR Negative 01/01/2016 1038   Sepsis Labs Invalid input(s): PROCALCITONIN,  WBC,  LACTICIDVEN Microbiology No results found for this or any previous visit (from the past 240 hour(s)).  Time spent: 30 min  SIGNED:   Marylu Lund, MD  [facility] 02/18/2018, 9:34 AM  If 7PM-7AM, please contact night-coverage

## 2018-02-21 ENCOUNTER — Telehealth: Payer: Self-pay | Admitting: *Deleted

## 2018-02-21 NOTE — Telephone Encounter (Signed)
Transition Care Management Follow-up Telephone Call  PCP: [name], [name], MD  Admit date: 02/16/2018 Discharge date: 02/18/2018  Admitted From: Home Disposition:  Home  Recommendations for Outpatient Follow-up:  1. Follow up with PCP in 1-2 weeks  Discharge Condition:Stable CODE STATUS:Full Diet recommendation: Regular     How have you been since you were released from the hospital? Some improvement    Do you understand why you were in the hospital? yes   Do you understand the discharge instructions? yes   Where were you discharged to? Home    Items Reviewed:  Medications reviewed: yes  Allergies reviewed: yes  Dietary changes reviewed: yes  Referrals reviewed: yes   Functional Questionnaire:   Activities of Daily Living (ADLs):   He states they are independent in the following: none States they require assistance with the following: none   Any transportation issues/concerns?: no   Any patient concerns? no   Confirmed importance and date/time of follow-up visits scheduled yes  Provider Appointment booked with Dr [name] 02/28/18 at 10 am  Confirmed with patient if condition begins to worsen call PCP or go to the ER.  Patient was given the office number and encouraged to call back with question or concerns.  : yes

## 2018-02-28 ENCOUNTER — Ambulatory Visit (INDEPENDENT_AMBULATORY_CARE_PROVIDER_SITE_OTHER): Payer: 59

## 2018-02-28 ENCOUNTER — Telehealth: Payer: Self-pay | Admitting: Family Medicine

## 2018-02-28 ENCOUNTER — Ambulatory Visit: Payer: 59 | Admitting: Family Medicine

## 2018-02-28 ENCOUNTER — Other Ambulatory Visit: Payer: Self-pay | Admitting: Family Medicine

## 2018-02-28 ENCOUNTER — Encounter: Payer: Self-pay | Admitting: Family Medicine

## 2018-02-28 VITALS — BP 126/84 | HR 70 | Temp 98.0°F | Resp 16 | Ht 72.0 in | Wt 236.1 lb

## 2018-02-28 DIAGNOSIS — R0609 Other forms of dyspnea: Secondary | ICD-10-CM

## 2018-02-28 DIAGNOSIS — R05 Cough: Secondary | ICD-10-CM

## 2018-02-28 DIAGNOSIS — E785 Hyperlipidemia, unspecified: Secondary | ICD-10-CM | POA: Diagnosis not present

## 2018-02-28 DIAGNOSIS — I2699 Other pulmonary embolism without acute cor pulmonale: Secondary | ICD-10-CM | POA: Diagnosis not present

## 2018-02-28 DIAGNOSIS — D696 Thrombocytopenia, unspecified: Secondary | ICD-10-CM

## 2018-02-28 DIAGNOSIS — I10 Essential (primary) hypertension: Secondary | ICD-10-CM | POA: Diagnosis not present

## 2018-02-28 DIAGNOSIS — R059 Cough, unspecified: Secondary | ICD-10-CM

## 2018-02-28 LAB — CBC WITH DIFFERENTIAL/PLATELET
BASOS ABS: 0 10*3/uL (ref 0.0–0.1)
Basophils Relative: 0.4 % (ref 0.0–3.0)
Eosinophils Absolute: 0.2 10*3/uL (ref 0.0–0.7)
Eosinophils Relative: 3.1 % (ref 0.0–5.0)
HCT: 42.5 % (ref 39.0–52.0)
HEMOGLOBIN: 14.5 g/dL (ref 13.0–17.0)
Lymphocytes Relative: 28.8 % (ref 12.0–46.0)
Lymphs Abs: 1.8 10*3/uL (ref 0.7–4.0)
MCHC: 34.1 g/dL (ref 30.0–36.0)
MCV: 90.8 fl (ref 78.0–100.0)
Monocytes Absolute: 0.4 10*3/uL (ref 0.1–1.0)
Monocytes Relative: 6.8 % (ref 3.0–12.0)
Neutro Abs: 3.8 10*3/uL (ref 1.4–7.7)
Neutrophils Relative %: 60.9 % (ref 43.0–77.0)
Platelets: 256 10*3/uL (ref 150.0–400.0)
RBC: 4.68 Mil/uL (ref 4.22–5.81)
RDW: 13.6 % (ref 11.5–15.5)
WBC: 6.2 10*3/uL (ref 4.0–10.5)

## 2018-02-28 LAB — LIPID PANEL
Cholesterol: 193 mg/dL (ref 0–200)
HDL: 44.1 mg/dL (ref >39.00)
LDL Cholesterol: 123 mg/dL — ABNORMAL HIGH (ref 0–99)
NonHDL: 148.96
Total CHOL/HDL Ratio: 4
Triglycerides: 131 mg/dL (ref 0.0–149.0)
VLDL: 26.2 mg/dL (ref 0.0–40.0)

## 2018-02-28 MED ORDER — RIVAROXABAN 20 MG PO TABS: 20.0000 mg | ORAL_TABLET | Freq: Every day | ORAL | 0 refills | Status: DC

## 2018-02-28 NOTE — Assessment & Plan Note (Signed)
For now he will continue nonpharmacologic treatment. Further recommendation will be given according to lipid panel results as well as 10 years CVD risk.

## 2018-02-28 NOTE — Telephone Encounter (Signed)
Copied from [place] [id number]. Topic: Quick Communication - Rx Refill/Question >> Feb 28, 2018 11:23 AM Windy Kalata wrote: Medication: rivaroxaban (XARELTO) 20 MG TABS tablet  Has the patient contacted their pharmacy? Yes.   (Agent: If no, request that the patient contact the pharmacy for the refill.) (Agent: If yes, when and what did the pharmacy advise?)    Tammy at [facility] has a duplicate prescription and does not know which one needs to be filled, there is another one for a different provider, please advise. Call back is [phone].  Preferred Pharmacy (with phone number or street name):   Agent: Please be advised that RX refills may take up to 3 business days. We ask that you follow-up with your pharmacy.

## 2018-02-28 NOTE — Assessment & Plan Note (Signed)
No clear risk factors identified. Tolerating Xarelto well. He will complete 21 days of Xarelto 15 mg twice daily, then he will continue Xarelto 20 mg daily. We are planning on continuing anticoagulation for 3 to 6 months.
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
## 2018-02-28 NOTE — Assessment & Plan Note (Signed)
Mild and seems to be improving. Further recommendation will be given according to CBC results. Instructed about warning signs.

## 2018-02-28 NOTE — Patient Instructions (Addendum)
A few things to remember from today's visit:   Cough - Plan: CBC with Differential/Platelet, DG Chest 2 View  Essential hypertension  Dyslipidemia - Plan: Lipid panel  Exertional dyspnea  Thrombocytopenia (HCC) - Plan: CBC with Differential/Platelet  Compression stockings. Continue daily walking.   Please be sure medication list is accurate. If a new problem present, please set up appointment sooner than planned today.

## 2018-02-28 NOTE — Telephone Encounter (Signed)
Called [facility] to verify prescription of rivaroxaban / Per OV today with Dr. [name] /  Disp Refills Start End   rivaroxaban (XARELTO) 20 MG TABS tablet 90 tablet 0 02/28/2018    Sig - Route: Take 1 tablet (20 mg total) by mouth daily with supper. - Oral   Sent to pharmacy as: rivaroxaban (XARELTO) 20 MG Tab tablet   Notes to Pharmacy: Cancel Xarelto 15 mg please.    /

## 2018-02-28 NOTE — Assessment & Plan Note (Signed)
BP adequately controlled. No changes in losartan 80 mg daily. Continue low-salt diet.

## 2018-02-28 NOTE — Assessment & Plan Note (Signed)
This is a chronic problem and it seems to be stable. We discussed possible etiologies,?  COPD.

## 2018-03-01 ENCOUNTER — Other Ambulatory Visit: Payer: Self-pay | Admitting: Family Medicine

## 2018-03-01 ENCOUNTER — Telehealth: Payer: Self-pay | Admitting: Family Medicine

## 2018-03-01 NOTE — Telephone Encounter (Signed)
Copied from [place] [id number]. Topic: Quick Communication - Lab Results (Clinic Use ONLY) >> Mar 01, 2018 10:29 AM Zacarias Pontes, CMA wrote: Called patient to inform them of their lab results. When patient returns call, triage nurse may disclose results.  Pt calling back for results of xray.

## 2018-03-01 NOTE — Telephone Encounter (Signed)
Patient notified

## 2018-03-07 ENCOUNTER — Other Ambulatory Visit: Payer: Self-pay | Admitting: *Deleted

## 2018-03-07 MED ORDER — ROSUVASTATIN CALCIUM 10 MG PO TABS: 10.0000 mg | ORAL_TABLET | Freq: Every day | ORAL | 3 refills | Status: DC

## 2018-03-23 ENCOUNTER — Ambulatory Visit: Payer: Self-pay | Admitting: *Deleted

## 2018-03-23 NOTE — Telephone Encounter (Signed)
Pt calling stating that he started to experience swelling to the left foot and ankle 3 days ago. Pt denies any fever, increased SOB to left foot or ankle. Pt denies any swelling to RLE. Pt states he was seen for OV on 02/28/18 and was diagnosed with DVTs in the left and right leg and also had PEs. Pt scheduled for appt with Dr. [name] on Friday. Pt advised to return call with worsening symptoms before scheduled appt on Friday, 03/25/18. Pt verbalized understanding.  Reason for Disposition . [1] MILD swelling of both ankles (i.e., pedal edema) AND [2] new onset or worsening  Answer Assessment - Initial Assessment Questions 1. ONSET: "When did the swelling start?" (e.g., minutes, hours, days)     3 days ago, only in foot and ankle 2. LOCATION: "What part of the leg is swollen?"  "Are both legs swollen or just one leg?"    Mostly in calf, ankle and foot, swelling in calt was there with blood clot 3. SEVERITY: "How bad is the swelling?" (e.g., localized; mild, moderate, severe)  - Localized - small area of swelling localized to one leg  - MILD pedal edema - swelling limited to foot and ankle, pitting edema < 1/4 inch (6 mm) deep, rest and elevation eliminate most or all swelling  - MODERATE edema - swelling of lower leg to knee, pitting edema > 1/4 inch (6 mm) deep, rest and elevation only partially reduce swelling  - SEVERE edema - swelling extends above knee, facial or hand swelling present      Mild 4. REDNESS: "Does the swelling look red or infected?"     No 5. PAIN: "Is the swelling painful to touch?" If so, ask: "How painful is it?"   (Scale 1-10; mild, moderate or severe)     Denies any pain 6. FEVER: "Do you have a fever?" If so, ask: "What is it, how was it measured, and when did it start?"      No 7. CAUSE: "What do you think is causing the leg swelling?"     Previous history of blood clots diagnosed on 12/30 8. MEDICAL HISTORY: "Do you have a history of heart failure, kidney disease,  liver failure, or cancer?"     No 9. RECURRENT SYMPTOM: "Have you had leg swelling before?" If so, ask: "When was the last time?" "What happened that time?"     Yes diagnosed on 02/28/18 with DVTs 10. OTHER SYMPTOMS: "Do you have any other symptoms?" (e.g., chest pain, difficulty breathing)       SOB pretty much the same with activity from previous OV on 02/28/18  Protocols used: LEG SWELLING AND EDEMA-A-AH

## 2018-03-25 ENCOUNTER — Ambulatory Visit (HOSPITAL_COMMUNITY): Payer: Medicare Other

## 2018-03-25 ENCOUNTER — Ambulatory Visit: Payer: 59 | Admitting: Family Medicine

## 2018-03-25 ENCOUNTER — Encounter: Payer: Self-pay | Admitting: Family Medicine

## 2018-03-25 VITALS — BP 122/84 | HR 101 | Temp 98.2°F | Resp 16 | Ht 72.0 in | Wt 236.2 lb

## 2018-03-25 DIAGNOSIS — R05 Cough: Secondary | ICD-10-CM | POA: Diagnosis not present

## 2018-03-25 DIAGNOSIS — R600 Localized edema: Secondary | ICD-10-CM

## 2018-03-25 DIAGNOSIS — R059 Cough, unspecified: Secondary | ICD-10-CM

## 2018-03-25 DIAGNOSIS — R0602 Shortness of breath: Secondary | ICD-10-CM | POA: Diagnosis not present

## 2018-03-25 NOTE — Patient Instructions (Signed)
A few things to remember from today's visit:   Edema of left lower extremity - Plan: VAS [place] LOWER EXTREMITY VENOUS (DVT)  Shortness of breath - Plan: VAS [place] LOWER EXTREMITY VENOUS (DVT), CT Angio Chest W/Cm &/Or Wo Cm   If shortness of breath or swelling of the leg get worse please seek immediate medical attention. We will try to arrange above test for next week as you requested.   Please be sure medication list is accurate. If a new problem present, please set up appointment sooner than planned today.
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
## 2018-03-28 ENCOUNTER — Ambulatory Visit (HOSPITAL_COMMUNITY)
Admission: RE | Admit: 2018-03-28 | Discharge: 2018-03-28 | Disposition: A | Discharge status: Home / Self Care | Payer: 59 | Source: Ambulatory Visit | Attending: Family Medicine | Admitting: Family Medicine

## 2018-03-28 ENCOUNTER — Ambulatory Visit (HOSPITAL_COMMUNITY): Payer: 59

## 2018-03-28 DIAGNOSIS — R0602 Shortness of breath: Secondary | ICD-10-CM

## 2018-03-28 DIAGNOSIS — R600 Localized edema: Secondary | ICD-10-CM | POA: Diagnosis present

## 2018-03-28 MED ORDER — IOPAMIDOL (ISOVUE-370) INJECTION 76%
INTRAVENOUS | Status: AC
  Administered 2018-03-28: 18:00:00
  Filled 2018-03-28: qty 100

## 2018-03-28 MED ORDER — IOPAMIDOL (ISOVUE-370) INJECTION 76%
INTRAVENOUS | Status: AC
  Filled 2018-03-28: qty 100

## 2018-03-28 NOTE — Progress Notes (Signed)
LLE venous duplex       has been completed. Preliminary results can be found under CV proc through chart review. June Leap, BS, RDMS, RVT    Attempted to call report to Dr. [name], but number given does not work. Tried office, but closed at [place]. Will let patient leave.

## 2018-05-22 ENCOUNTER — Other Ambulatory Visit: Payer: Self-pay | Admitting: Family Medicine

## 2018-05-22 DIAGNOSIS — I10 Essential (primary) hypertension: Secondary | ICD-10-CM

## 2018-05-30 ENCOUNTER — Ambulatory Visit (INDEPENDENT_AMBULATORY_CARE_PROVIDER_SITE_OTHER): Payer: 59 | Admitting: Family Medicine

## 2018-05-30 ENCOUNTER — Encounter: Payer: Self-pay | Admitting: Family Medicine

## 2018-05-30 ENCOUNTER — Other Ambulatory Visit: Payer: Self-pay

## 2018-05-30 VITALS — BP 104/64 | HR 66 | Resp 12

## 2018-05-30 DIAGNOSIS — R42 Dizziness and giddiness: Secondary | ICD-10-CM

## 2018-05-30 DIAGNOSIS — I10 Essential (primary) hypertension: Secondary | ICD-10-CM

## 2018-05-30 DIAGNOSIS — E785 Hyperlipidemia, unspecified: Secondary | ICD-10-CM | POA: Diagnosis not present

## 2018-05-30 DIAGNOSIS — I2699 Other pulmonary embolism without acute cor pulmonale: Secondary | ICD-10-CM | POA: Diagnosis not present

## 2018-05-30 DIAGNOSIS — R0609 Other forms of dyspnea: Secondary | ICD-10-CM | POA: Diagnosis not present

## 2018-05-30 MED ORDER — RIVAROXABAN 20 MG PO TABS: 20.0000 mg | ORAL_TABLET | Freq: Every day | ORAL | 0 refills | Status: DC

## 2018-05-30 NOTE — Assessment & Plan Note (Signed)
This is a chronic problem that seems to be worse since episode of PE but otherwise stable. On 02/17/2018 show LVEF 60 to 65%  10/02/2016:Cardiolite stress test:   The left ventricular ejection fraction is mildly decreased (45-54%).  Nuclear stress EF: 52%.  Blood pressure demonstrated a blunted response to exercise.  There was no ST segment deviation noted during stress.  Defect 1: There is a small defect of moderate severity present in the apex location.This is a low risk study.  He prefers to hold on cardio f/u for now. Clearly instructed about warning signs.

## 2018-05-30 NOTE — Assessment & Plan Note (Signed)
BP on low normal range,this could be causing lightheadedness. Recommend decreasing Diovan dose from 80 mg to 40 mg. Continue monitoring BP at home. Continue low-salt diet. Follow-up in 3 months, before if needed.

## 2018-05-30 NOTE — Assessment & Plan Note (Signed)
He is tolerating Xarelto 20 mg well. We discussed side effects in detail. Because of severity on thrombotic episode (PE and bilateral lower extremity DVT) we will complete 5 to 6 months of Xarelto.
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
## 2018-05-30 NOTE — Assessment & Plan Note (Signed)
He is tolerating Crestor 10 mg well. We will plan on lipid panel and LFTs next visit. No changes in current management. Follow-up in 3 months.

## 2018-06-15 ENCOUNTER — Telehealth: Payer: Self-pay | Admitting: Gastroenterology

## 2018-06-15 NOTE — Telephone Encounter (Signed)
Patient called said that he has had a colonoscopy for the past 2 years due to finding polyps every time. He is currently due for one this year. Would you find it urgent to schedule the patient for a colonoscopy or wait until the [place] pandemic is over. Patient also said that he is now on blood thinners (Xarelto) since his last visit with [place].

## 2018-06-15 NOTE — Telephone Encounter (Signed)
Please let Willie Dominguez know that I received the phone message regarding his concern on timing of colonoscopy.  His recall for history of colon polyps can be moved back to August 2020.  Though he is currently on Xarelto for DVT/PE diagnosed late last year, recent primary care note indicates he will probably have a maximum 6 months of that therapy.  When his name comes up on recall for the August procedure, I will then review his chart make sure he is off oral anticoagulation and okay to be scheduled for procedure.

## 2018-06-15 NOTE — Telephone Encounter (Signed)
Mr Word has been notified and aware. Recall has been updated.

## 2018-09-08 ENCOUNTER — Other Ambulatory Visit: Payer: Self-pay | Admitting: Family Medicine

## 2018-09-08 DIAGNOSIS — I10 Essential (primary) hypertension: Secondary | ICD-10-CM

## 2018-09-27 ENCOUNTER — Encounter: Payer: Self-pay | Admitting: Gastroenterology

## 2018-10-10 ENCOUNTER — Encounter: Payer: Self-pay | Admitting: Family Medicine

## 2018-10-10 ENCOUNTER — Ambulatory Visit: Payer: 59 | Admitting: Family Medicine

## 2018-10-10 ENCOUNTER — Other Ambulatory Visit: Payer: Self-pay

## 2018-10-10 VITALS — BP 120/76 | HR 60 | Temp 98.4°F | Resp 12 | Ht 72.0 in | Wt 239.2 lb

## 2018-10-10 DIAGNOSIS — S0512XA Contusion of eyeball and orbital tissues, left eye, initial encounter: Secondary | ICD-10-CM

## 2018-10-10 DIAGNOSIS — G609 Hereditary and idiopathic neuropathy, unspecified: Secondary | ICD-10-CM | POA: Diagnosis not present

## 2018-10-10 DIAGNOSIS — R0609 Other forms of dyspnea: Secondary | ICD-10-CM

## 2018-10-10 DIAGNOSIS — R062 Wheezing: Secondary | ICD-10-CM

## 2018-10-10 DIAGNOSIS — I10 Essential (primary) hypertension: Secondary | ICD-10-CM | POA: Diagnosis not present

## 2018-10-10 DIAGNOSIS — D696 Thrombocytopenia, unspecified: Secondary | ICD-10-CM

## 2018-10-10 LAB — COMPREHENSIVE METABOLIC PANEL
ALT: 23 U/L (ref 0–53)
AST: 19 U/L (ref 0–37)
Albumin: 4.4 g/dL (ref 3.5–5.2)
Alkaline Phosphatase: 94 U/L (ref 39–117)
BUN: 14 mg/dL (ref 6–23)
CO2: 27 mEq/L (ref 19–32)
Calcium: 9.5 mg/dL (ref 8.4–10.5)
Chloride: 105 mEq/L (ref 96–112)
Creatinine, Ser: 1.12 mg/dL (ref 0.40–1.50)
GFR: 65.25 mL/min (ref >60.00)
Glucose, Bld: 107 mg/dL — ABNORMAL HIGH (ref 70–99)
Potassium: 4.4 mEq/L (ref 3.5–5.1)
Sodium: 140 mEq/L (ref 135–145)
Total Bilirubin: 0.7 mg/dL (ref 0.2–1.2)
Total Protein: 7 g/dL (ref 6.0–8.3)

## 2018-10-10 LAB — CBC
HCT: 42.1 % (ref 39.0–52.0)
Hemoglobin: 14.3 g/dL (ref 13.0–17.0)
MCHC: 34 g/dL (ref 30.0–36.0)
MCV: 92.9 fl (ref 78.0–100.0)
Platelets: 152 10*3/uL (ref 150.0–400.0)
RBC: 4.53 Mil/uL (ref 4.22–5.81)
RDW: 13.6 % (ref 11.5–15.5)
WBC: 5.6 10*3/uL (ref 4.0–10.5)

## 2018-10-10 MED ORDER — BUDESONIDE-FORMOTEROL FUMARATE 80-4.5 MCG/ACT IN AERO: 2.0000 | INHALATION_SPRAY | Freq: Two times a day (BID) | RESPIRATORY_TRACT | 3 refills | Status: DC

## 2018-10-10 MED ORDER — ALBUTEROL SULFATE HFA 108 (90 BASE) MCG/ACT IN AERS: 2.0000 | INHALATION_SPRAY | Freq: Four times a day (QID) | RESPIRATORY_TRACT | 0 refills | Status: DC | PRN
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
## 2018-10-10 NOTE — Assessment & Plan Note (Signed)
He has noticed some improvement since his last visit. Appropriate skin care and avoidance of trauma. Continue following with neurologist.

## 2018-10-10 NOTE — Assessment & Plan Note (Signed)
BP is adequately controlled. Continue Diovan 80 mg daily. Recommend monitoring BP regularly. Continue low-salt diet.

## 2018-10-10 NOTE — Assessment & Plan Note (Signed)
It seems to be getting worse since 03/2018, I do not think this is caused by an acute event like PE.  He prefers to hold on chest CTA. He was clearly instructed about warning signs. EKG done today SR, normal axis, no signs of acute ischemia.  No significant changes when compared with old EKGs 02/17/2018. Follow-up in 4 weeks.

## 2018-10-10 NOTE — Assessment & Plan Note (Signed)
He has had this problem before and could be related to left periocular ecchymosis is reported. Further recommendation will be given according to lab results.

## 2018-10-10 NOTE — Patient Instructions (Signed)
A few things to remember from today's visit:   Essential hypertension - Plan: Comprehensive metabolic panel, EKG 94-VOPF  Hereditary and idiopathic peripheral neuropathy  Exertional dyspnea - Plan: Comprehensive metabolic panel, EKG 29-WKMQ, CBC  Ecchymosis of left eye, initial encounter  Wheezing - Plan: budesonide-formoterol (SYMBICORT) 80-4.5 MCG/ACT inhaler, albuterol (VENTOLIN HFA) 108 (90 Base) MCG/ACT inhaler  Thrombocytopenia (HCC), Chronic  If shortness of breath gets suddenly worse or you start with chest pain or palpitation you need to seek immediate medical attention, go to the ER.  Today I added Symbicort to take 2 puff twice daily and albuterol inhaler 2 puffs every 6 hours if needed for shortness of breath. I would like to see you back in 4 weeks, before if needed.  Please be sure medication list is accurate. If a new problem present, please set up appointment sooner than planned today.

## 2018-11-10 ENCOUNTER — Other Ambulatory Visit: Payer: Self-pay | Admitting: Family Medicine

## 2018-11-10 DIAGNOSIS — R062 Wheezing: Secondary | ICD-10-CM

## 2018-11-11 ENCOUNTER — Encounter: Payer: Self-pay | Admitting: Family Medicine

## 2018-11-11 ENCOUNTER — Ambulatory Visit: Payer: 59 | Admitting: Family Medicine

## 2018-11-11 ENCOUNTER — Other Ambulatory Visit: Payer: Self-pay

## 2018-11-11 VITALS — BP 120/80 | HR 62 | Temp 96.7°F | Resp 12 | Ht 72.0 in

## 2018-11-11 DIAGNOSIS — R0789 Other chest pain: Secondary | ICD-10-CM

## 2018-11-11 DIAGNOSIS — R0609 Other forms of dyspnea: Secondary | ICD-10-CM

## 2018-11-11 DIAGNOSIS — Z23 Encounter for immunization: Secondary | ICD-10-CM

## 2018-11-11 DIAGNOSIS — I10 Essential (primary) hypertension: Secondary | ICD-10-CM

## 2018-11-11 NOTE — Patient Instructions (Addendum)
A few things to remember from today's visit:   Exertional dyspnea - Plan: Ambulatory referral to Cardiology  Chest tightness - Plan: Ambulatory referral to Cardiology  Essential hypertension  Because shortness of breath did not change with inhalers, we can discontinue. We will be arranging appointment with cardiologist to evaluate for cardiac causes. Your blood pressure is well controlled, no changes in your medications.    Please be sure medication list is accurate. If a new problem present, please set up appointment sooner than planned today.
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
## 2018-11-11 NOTE — Progress Notes (Signed)
Patient is scheduled   

## 2018-11-11 NOTE — Assessment & Plan Note (Signed)
BP adequately controlled. Continue valsartan 80 mg daily. Recommend monitoring BP at home. Continue low-salt diet.

## 2018-11-11 NOTE — Assessment & Plan Note (Signed)
Chronic and stable. Symbicort and albuterol did not help, so discontinued both. Instructed about warning signs. Appointment with Dr. Einar Gip will be arranged.

## 2018-11-24 ENCOUNTER — Ambulatory Visit: Payer: 59 | Admitting: Cardiology

## 2018-11-24 ENCOUNTER — Other Ambulatory Visit: Payer: Self-pay

## 2018-11-24 ENCOUNTER — Encounter: Payer: Self-pay | Admitting: Cardiology

## 2018-11-24 VITALS — BP 136/86 | HR 67 | Temp 98.0°F | Ht 72.0 in | Wt 240.0 lb

## 2018-11-24 DIAGNOSIS — R0609 Other forms of dyspnea: Secondary | ICD-10-CM

## 2018-11-24 DIAGNOSIS — R079 Chest pain, unspecified: Secondary | ICD-10-CM | POA: Diagnosis not present

## 2018-11-24 DIAGNOSIS — I10 Essential (primary) hypertension: Secondary | ICD-10-CM | POA: Diagnosis not present
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
## 2018-11-26 ENCOUNTER — Encounter: Payer: Self-pay | Admitting: Cardiology

## 2018-12-02 ENCOUNTER — Other Ambulatory Visit: Payer: Self-pay

## 2018-12-02 ENCOUNTER — Ambulatory Visit (INDEPENDENT_AMBULATORY_CARE_PROVIDER_SITE_OTHER): Payer: 59

## 2018-12-02 DIAGNOSIS — R079 Chest pain, unspecified: Secondary | ICD-10-CM | POA: Diagnosis not present

## 2018-12-02 DIAGNOSIS — R0600 Dyspnea, unspecified: Secondary | ICD-10-CM

## 2018-12-02 DIAGNOSIS — R0609 Other forms of dyspnea: Secondary | ICD-10-CM

## 2018-12-08 ENCOUNTER — Ambulatory Visit: Payer: 59 | Admitting: Gastroenterology

## 2018-12-08 ENCOUNTER — Other Ambulatory Visit: Payer: Self-pay

## 2018-12-08 ENCOUNTER — Encounter: Payer: Self-pay | Admitting: Gastroenterology

## 2018-12-08 VITALS — BP 128/82 | HR 93 | Temp 98.0°F | Ht 72.0 in | Wt 236.5 lb

## 2018-12-08 DIAGNOSIS — Z86010 Personal history of colonic polyps: Secondary | ICD-10-CM | POA: Diagnosis not present

## 2018-12-08 DIAGNOSIS — R194 Change in bowel habit: Secondary | ICD-10-CM

## 2018-12-08 MED ORDER — NA SULFATE-K SULFATE-MG SULF 17.5-3.13-1.6 GM/177ML PO SOLN: 1.0000 | Freq: Once | ORAL | 0 refills | Status: AC

## 2018-12-08 NOTE — Progress Notes (Signed)
[facility] Progress Note  Chief Complaint: History of colon polyps  Subjective  History:  Large sessile adenomatous polyp in the cecum found November 2017, removed piecemeal by EMR with advanced endoscopist at [facility] January 2018.  Repeat colonoscopy with me April 2019 found residual cecal adenoma removed by EMR, 3 additional ascending colon polyps.  He was due for recall colonoscopy April 2020, but deferred due to [place] and because he was on [facility] for DVT/PE discovered December 2019.  He completed his course of [place] in early June (I reviewed the most recent notes from his primary care provider, Dr. [name], where she also indicated he was having exertional dyspnea).  He saw Dr. Virgina Jock of cardiology recently, who was concerned about pulmonary hypertension, and planned echocardiogram and nuclear stress test.  I asked him about his symptoms, and he describes worsening fatigue that occurs by the end of the day.  He also has exertional shortness of breath such as when doing yard work, and it may progress to a chest tightness.  His exercise stress test is scheduled for October 29.  ROS: His appetite is good and his weight stable.  He has noticed a change in bowel habits, where they are will every 2 or 3 days per his usual, but they are "darker and pasty".  Remainder systems negative except as above  The patient's Past Medical, Family and Social History were reviewed and are on file in the EMR.  Objective:  Med list reviewed  Current Outpatient Medications:  .  aspirin EC 81 MG tablet, Take 81 mg by mouth daily., Disp: , Rfl:  .  cholecalciferol (VITAMIN D3) 25 MCG (1000 UT) tablet, Take 1,000 Units by mouth daily., Disp: , Rfl:  .  co-enzyme Q-10 50 MG capsule, Take 100 mg by mouth daily., Disp: , Rfl:  .  rosuvastatin (CRESTOR) 10 MG tablet, Take 1 tablet (10 mg total) by mouth daily., Disp: 90 tablet, Rfl: 3 .  valsartan (DIOVAN) 80 MG tablet, TAKE 1 TABLET BY MOUTH EVERY  DAY, Disp: 30 tablet, Rfl: 2 .  vitamin B-12 (CYANOCOBALAMIN) 1000 MCG tablet, Take 1,000 mcg by mouth daily., Disp: , Rfl:  .  vitamin E 200 UNIT capsule, Take 180 Units by mouth daily., Disp: , Rfl:  .  Na Sulfate-K Sulfate-Mg Sulf 17.5-3.13-1.6 GM/177ML SOLN, Take 1 kit by mouth once for 1 dose., Disp: 354 mL, Rfl: 0   Vital signs in last 24 hrs: Vitals:   12/08/18 0924  BP: 128/82  Pulse: 93  Temp: 98 F (36.7 C)    Physical Exam  He is well-appearing  HEENT: sclera anicteric, oral mucosa moist without lesions  Neck: supple, no thyromegaly, JVD or lymphadenopathy  Cardiac: RRR without murmurs, S1S2 heard, mild peripheral edema  Pulm: clear to auscultation bilaterally, normal RR and effort noted  Abdomen: soft, no tenderness, with active bowel sounds. No guarding or palpable hepatosplenomegaly.  Skin; warm and dry, no jaundice or rash  Recent Labs:    Radiologic studies: Echocardiogram 12/02/2018: Left ventricle cavity is normal in size. Mild concentric hypertrophy of the left ventricle. Normal LV systolic function with EF 55%. Normal global wall motion. Normal diastolic filling pattern.  The aortic root is mildly dilated at 4.1 cm. Left atrial cavity is normal in size. Aneurysmal interatrial septum without 2D or color Doppler evidence of interatrial shunt. Right ventricle cavity is mildly dilated. Normal right ventricular function. Trileaflet aortic valve. Trace aortic regurgitation. Mild (Grade I) mitral regurgitation.  Mild tricuspid regurgitation. Estimated pulmonary artery systolic pressure is 23 mmHg. No significant change compared to previous study on 02/17/2018    _0 @ Assessment: Encounter Diagnoses  Name Primary?  . Personal history of colonic polyps Yes  . Altered bowel habits    Due for surveillance colonoscopy, strong history of polyps in last several years. No longer on [place].  However, having symptoms that are either respiratory  or cardiac in nature.  His cardiologist told him that if recent stress test was normal, he would be for to pulmonary.    Plan: We have scheduled his colonoscopy for mid next month, after the date of his scheduled stress test.  We will check up on the stress test results and, if negative for ischemia, proceed as scheduled with colonoscopy.  If stress test is abnormal and cardiac cath planned, colonoscopy will be rescheduled to a later date.   Total time 25 minutes, over half spent face-to-face with patient in counseling and coordination of care.   Willie Dominguez

## 2018-12-08 NOTE — Patient Instructions (Addendum)
If you are age 68 or older, your body mass index should be between 23-30. Your Body mass index is 32.08 kg/m. If this is out of the aforementioned range listed, please consider follow up with your Primary Care Provider.  If you are age 64 or younger, your body mass index should be between 19-25. Your Body mass index is 32.08 kg/m. If this is out of the aformentioned range listed, please consider follow up with your Primary Care Provider.   You have been scheduled for a colonoscopy. Please follow written instructions given to you at your visit today.  Please pick up your prep supplies at the pharmacy within the next 1-3 days. If you use inhalers (even only as needed), please bring them with you on the day of your procedure. Your physician has requested that you go to www.startemmi.com and enter the access code given to you at your visit today. This web site gives a general overview about your procedure. However, you should still follow specific instructions given to you by our office regarding your preparation for the procedure.  It was a pleasure to see you today!  Dr. Loletha Carrow

## 2018-12-12 NOTE — Progress Notes (Signed)
Pt aware.

## 2018-12-26 ENCOUNTER — Other Ambulatory Visit: Payer: Self-pay

## 2018-12-26 ENCOUNTER — Ambulatory Visit (INDEPENDENT_AMBULATORY_CARE_PROVIDER_SITE_OTHER): Payer: 59

## 2018-12-26 ENCOUNTER — Encounter: Payer: Self-pay | Admitting: Family Medicine

## 2018-12-26 DIAGNOSIS — R0600 Dyspnea, unspecified: Secondary | ICD-10-CM

## 2018-12-26 DIAGNOSIS — R079 Chest pain, unspecified: Secondary | ICD-10-CM | POA: Diagnosis not present

## 2018-12-26 DIAGNOSIS — R0609 Other forms of dyspnea: Secondary | ICD-10-CM
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
## 2018-12-28 ENCOUNTER — Other Ambulatory Visit: Payer: Self-pay | Admitting: Family Medicine

## 2018-12-28 DIAGNOSIS — I10 Essential (primary) hypertension: Secondary | ICD-10-CM

## 2019-01-01 HISTORY — PX: COLONOSCOPY: SHX174

## 2019-01-02 ENCOUNTER — Telehealth: Payer: Self-pay | Admitting: Gastroenterology

## 2019-01-02 NOTE — Telephone Encounter (Signed)
Thanks for your message. I have an office visit with him on 11/4. If he has persistent symptoms s/o ischemia, I may proceed with coronary angiography. Will keep you posted.   Thanks [employer]

## 2019-01-02 NOTE — Telephone Encounter (Signed)
Dr. Virgina Jock,  I recently saw this patient for history of colon polyp and have tentative plans for a routine surveillance colonoscopy on November 12, pending results of his cardiac stress test.  I see the report of his nuclear stress test from October 26, which seems to have equivocal results.  Please let me know if this is felt to show ischemia and if cardiac catheterization is planned.  If there is any ongoing concern for this patient's symptoms being ischemic in nature, then we will plan to cancel his colonoscopy.   [name], MD    [facility]

## 2019-01-04 ENCOUNTER — Telehealth (INDEPENDENT_AMBULATORY_CARE_PROVIDER_SITE_OTHER): Payer: 59 | Admitting: Cardiology

## 2019-01-04 ENCOUNTER — Encounter: Payer: Self-pay | Admitting: Cardiology

## 2019-01-04 ENCOUNTER — Other Ambulatory Visit: Payer: Self-pay

## 2019-01-04 VITALS — BP 152/79 | HR 67 | Ht 72.0 in | Wt 238.0 lb

## 2019-01-04 DIAGNOSIS — R079 Chest pain, unspecified: Secondary | ICD-10-CM | POA: Diagnosis not present

## 2019-01-04 DIAGNOSIS — R0609 Other forms of dyspnea: Secondary | ICD-10-CM

## 2019-01-04 DIAGNOSIS — I10 Essential (primary) hypertension: Secondary | ICD-10-CM | POA: Diagnosis not present

## 2019-01-04 DIAGNOSIS — R9439 Abnormal result of other cardiovascular function study: Secondary | ICD-10-CM | POA: Diagnosis not present

## 2019-01-04 DIAGNOSIS — R0600 Dyspnea, unspecified: Secondary | ICD-10-CM

## 2019-01-04 MED ORDER — AMLODIPINE BESYLATE 5 MG PO TABS: 5.0000 mg | ORAL_TABLET | Freq: Every day | ORAL | 3 refills | Status: DC

## 2019-01-04 MED ORDER — NITROGLYCERIN 0.4 MG SL SUBL: 0.4000 mg | SUBLINGUAL_TABLET | SUBLINGUAL | 3 refills | Status: DC | PRN

## 2019-01-05 NOTE — Telephone Encounter (Signed)
This patient is scheduled for a colonoscopy with me on 11/12. We were waiting for his cardiology follow up after recent stress test.  Dr. Bonney Roussel office note is on file and he forwarded it to me. No cardiac catheterization planned, and he feels we can proceed with colonoscopy. Please let Willie Dominguez know.  Thanks

## 2019-01-05 NOTE — Telephone Encounter (Signed)
Patient has already been notified from Dr Evelene Croon.

## 2019-01-10 ENCOUNTER — Encounter: Payer: Self-pay | Admitting: Gastroenterology

## 2019-01-12 ENCOUNTER — Encounter: Payer: Self-pay | Admitting: Gastroenterology

## 2019-01-12 ENCOUNTER — Other Ambulatory Visit: Payer: Self-pay

## 2019-01-12 ENCOUNTER — Ambulatory Visit (AMBULATORY_SURGERY_CENTER): Payer: 59 | Admitting: Gastroenterology

## 2019-01-12 VITALS — BP 121/73 | HR 48 | Temp 97.6°F | Resp 14 | Ht 72.0 in | Wt 236.0 lb

## 2019-01-12 DIAGNOSIS — Z86010 Personal history of colonic polyps: Secondary | ICD-10-CM

## 2019-01-12 DIAGNOSIS — D120 Benign neoplasm of cecum: Secondary | ICD-10-CM

## 2019-01-12 DIAGNOSIS — D123 Benign neoplasm of transverse colon: Secondary | ICD-10-CM

## 2019-01-12 MED ORDER — SODIUM CHLORIDE 0.9 % IV SOLN: 500.0000 mL | Freq: Once | INTRAVENOUS | Status: DC

## 2019-01-12 NOTE — Progress Notes (Signed)
PT taken to PACU. Monitors in place. VSS. Report given to RN. 

## 2019-01-12 NOTE — Patient Instructions (Signed)
YOU HAD AN ENDOSCOPIC PROCEDURE TODAY AT THE [facility]:   Refer to the procedure report that was given to you for any specific questions about what was found during the examination.  If the procedure report does not answer your questions, please call your gastroenterologist to clarify.  If you requested that your care partner not be given the details of your procedure findings, then the procedure report has been included in a sealed envelope for you to review at your convenience later.  YOU SHOULD EXPECT: Some feelings of bloating in the abdomen. Passage of more gas than usual.  Walking can help get rid of the air that was put into your GI tract during the procedure and reduce the bloating. If you had a lower endoscopy (such as a colonoscopy or flexible sigmoidoscopy) you may notice spotting of blood in your stool or on the toilet paper. If you underwent a bowel prep for your procedure, you may not have a normal bowel movement for a few days.  Please Note:  You might notice some irritation and congestion in your nose or some drainage.  This is from the oxygen used during your procedure.  There is no need for concern and it should clear up in a day or so.  SYMPTOMS TO REPORT IMMEDIATELY:   Following lower endoscopy (colonoscopy or flexible sigmoidoscopy):  Excessive amounts of blood in the stool  Significant tenderness or worsening of abdominal pains  Swelling of the abdomen that is new, acute  Fever of 100F or higher  For urgent or emergent issues, a gastroenterologist can be reached at any hour by calling [phone].   DIET:  We do recommend a small meal at first, but then you may proceed to your regular diet.  Drink plenty of fluids but you should avoid alcoholic beverages for 24 hours.  ACTIVITY:  You should plan to take it easy for the rest of today and you should NOT DRIVE or use heavy machinery until tomorrow (because of the sedation medicines used during the test).     FOLLOW UP: Our staff will call the number listed on your records 48-72 hours following your procedure to check on you and address any questions or concerns that you may have regarding the information given to you following your procedure. If we do not reach you, we will leave a message.  We will attempt to reach you two times.  During this call, we will ask if you have developed any symptoms of COVID 19. If you develop any symptoms (ie: fever, flu-like symptoms, shortness of breath, cough etc.) before then, please call [phone].  If you test positive for Covid 19 in the 2 weeks post procedure, please call and report this information to us.    If any biopsies were taken you will be contacted by phone or by letter within the next 1-3 weeks.  Please call us at [phone] if you have not heard about the biopsies in 3 weeks.    SIGNATURES/CONFIDENTIALITY: You and/or your care partner have signed paperwork which will be entered into your electronic medical record.  These signatures attest to the fact that that the information above on your After Visit Summary has been reviewed and is understood.  Full responsibility of the confidentiality of this discharge information lies with you and/or your care-partner. 

## 2019-01-12 NOTE — Op Note (Signed)
[place] Patient Name: Willie Dominguez Procedure Date: 01/12/2019 11:22 AM MRN: [id number] Endoscopist: Mallie Mussel L. Loletha Carrow , MD Age: 68 Referring MD:  Date of Birth: April 01, 1950 Gender: Male Account #: [id number] Procedure:                Colonoscopy Indications:              Increased risk colon cancer surveillance: Personal                            history of adenoma (10 mm or greater in size) -                            large sessile cecal tubular adenoma discovered late                            2017, removed with EMR at [facility] 03/2016. Colonoscopy                            05/2017 removed more cecal polyp tissue and 3 other                            adenomas in remainder of colon) Medicines:                Monitored Anesthesia Care Procedure:                Pre-Anesthesia Assessment:                           - Prior to the procedure, a History and Physical                            was performed, and patient medications and                            allergies were reviewed. The patient's tolerance of                            previous anesthesia was also reviewed. The risks                            and benefits of the procedure and the sedation                            options and risks were discussed with the patient.                            All questions were answered, and informed consent                            was obtained. Prior Anticoagulants: The patient has                            taken no previous anticoagulant or antiplatelet  agents except for aspirin. ASA Grade Assessment: II                            - A patient with mild systemic disease. After                            reviewing the risks and benefits, the patient was                            deemed in satisfactory condition to undergo the                            procedure.                           After obtaining informed consent, the colonoscope                        was passed under direct vision. Throughout the                            procedure, the patient's blood pressure, pulse, and                            oxygen saturations were monitored continuously. The                            Colonoscope was introduced through the anus and                            advanced to the the cecum, identified by                            appendiceal orifice and ileocecal valve. The                            colonoscopy was performed with difficulty due to                            poor bowel prep and significant looping. Successful                            completion of the procedure was aided by changing                            the patient to a supine position and using manual                            pressure. The patient tolerated the procedure                            fairly well. The quality of the bowel preparation  was poor. The ileocecal valve, appendiceal orifice,                            and rectum were photographed. The quality of the                            bowel preparation was evaluated using the BBPS                            [facility] Bowel Preparation Scale) with scores of:                            Right Colon = 1, Transverse Colon = 1 and Left                            Colon = 1. The total BBPS score equals 3. The bowel                            preparation used was SUPREP (patient did not                            tolerate prep well, especially AM dose). Scope In: 11:29:40 AM Scope Out: 12:13:09 PM Scope Withdrawal Time: 0 hours 30 minutes 41 seconds  Total Procedure Duration: 0 hours 43 minutes 29 seconds  Findings:                 The perianal and digital rectal examinations were                            normal.                           Many diverticula were found in the left colon.                           The sigmoid colon was redundant, causing scope                             looping.                           A 15 mm polyp was found in the cecum at the prior                            polypectomy site. The polyp was multi-lobulated and                            sessile, some tissue soft and frond-like.                            Preparations were made for mucosal resection. 6cc                            saline was injected to raise the  lesion. Hot and                            cold snare mucosal resection was performed.                            Resection was incomplete (about 25% remaining), and                            the resected tissue was partially retrieved.                           Challenges encountered with polypectomy: scope                            looping/positioning, prior snare polypectomies at                            same site/scarring, soft polyp tissue.                           A diminutive polyp was found in the transverse                            colon. The polyp was sessile. The polyp was removed                            with a cold snare. Resection was complete, but the                            polyp tissue was not retrieved (poor prep).                           The exam was otherwise without abnormality on                            direct and retroflexion views. Complications:            No immediate complications. Estimated Blood Loss:     Estimated blood loss was minimal. Impression:               - Preparation of the colon was poor.                           - Diverticulosis in the left colon.                           - Redundant colon.                           - One 15 mm polyp in the cecum, removed with                            mucosal resection. Polyp resection was incomplete,  and the resected tissue was partially retrieved.                           - One diminutive polyp in the transverse colon,                            removed with a cold snare. Complete  resection.                            Polyp tissue not retrieved.                           - The examination was otherwise normal on direct                            and retroflexion views.                           - Mucosal resection was performed. Resection was                            incomplete, and the resected tissue was partially                            retrieved. Recommendation:           - Patient has a contact number available for                            emergencies. The signs and symptoms of potential                            delayed complications were discussed with the                            patient. Return to normal activities tomorrow.                            Written discharge instructions were provided to the                            patient.                           - Resume previous diet.                           - Continue present medications.                           - Await pathology results.                           - Repeat colonoscopy for polypectomy. Patient will                            be referred back  to advanced endoscopist at [facility] for                            complex EMR. 4-LITER PEG PREP WILL BE NEEDED Apollonia Amini L. Loletha Carrow, MD 01/12/2019 12:32:05 PM This report has been signed electronically.

## 2019-01-12 NOTE — Progress Notes (Signed)
Called to room to assist during endoscopic procedure.  Patient ID and intended procedure confirmed with present staff. Received instructions for my participation in the procedure from the performing physician.  

## 2019-01-16 ENCOUNTER — Telehealth: Payer: Self-pay | Admitting: *Deleted

## 2019-01-16 NOTE — Telephone Encounter (Signed)
No answer for post procedure call back. Left message for patient to call with questions and concerns. 

## 2019-01-16 NOTE — Telephone Encounter (Signed)
Left message on f/u call 

## 2019-01-18 ENCOUNTER — Telehealth: Payer: Self-pay | Admitting: Gastroenterology

## 2019-01-18 NOTE — Telephone Encounter (Signed)
Dr. Darrel Hoover office at [facility] called about referral that we sent for this pt. Pt has never been seen there so they need demographics and copy of pt's isurance card Faxed to [phone].

## 2019-01-18 NOTE — Telephone Encounter (Signed)
The patient was seen by Dr. Ivor Messier at [facility] on 03/06/16 at 2:30 pm for a colonoscopy, EMR and polypectomy per [facility].  Although the patient was previously seen in their endo center, the requested information was faxed to the number [phone].

## 2019-03-07 ENCOUNTER — Ambulatory Visit (INDEPENDENT_AMBULATORY_CARE_PROVIDER_SITE_OTHER): Payer: 59 | Admitting: Family Medicine

## 2019-03-07 ENCOUNTER — Encounter: Payer: Self-pay | Admitting: Family Medicine

## 2019-03-07 ENCOUNTER — Other Ambulatory Visit: Payer: Self-pay

## 2019-03-07 VITALS — BP 120/70 | HR 69 | Temp 96.3°F | Resp 16 | Ht 72.0 in | Wt 238.0 lb

## 2019-03-07 DIAGNOSIS — E785 Hyperlipidemia, unspecified: Secondary | ICD-10-CM | POA: Diagnosis not present

## 2019-03-07 DIAGNOSIS — R0781 Pleurodynia: Secondary | ICD-10-CM

## 2019-03-07 DIAGNOSIS — Z0000 Encounter for general adult medical examination without abnormal findings: Secondary | ICD-10-CM | POA: Diagnosis not present

## 2019-03-07 DIAGNOSIS — R0602 Shortness of breath: Secondary | ICD-10-CM

## 2019-03-07 DIAGNOSIS — M545 Low back pain, unspecified: Secondary | ICD-10-CM

## 2019-03-07 DIAGNOSIS — Z86711 Personal history of pulmonary embolism: Secondary | ICD-10-CM

## 2019-03-07 DIAGNOSIS — I10 Essential (primary) hypertension: Secondary | ICD-10-CM

## 2019-03-07 DIAGNOSIS — G609 Hereditary and idiopathic neuropathy, unspecified: Secondary | ICD-10-CM

## 2019-03-07 MED ORDER — DULOXETINE HCL 30 MG PO CPEP: 30.0000 mg | ORAL_CAPSULE | Freq: Every day | ORAL | 1 refills | Status: DC

## 2019-03-07 MED ORDER — TIZANIDINE HCL 4 MG PO TABS: 4.0000 mg | ORAL_TABLET | Freq: Three times a day (TID) | ORAL | 0 refills | Status: AC | PRN

## 2019-03-07 NOTE — Patient Instructions (Addendum)
A few things to remember from today's visit:   Essential hypertension  Dyslipidemia  Acute midline low back pain without sciatica - Plan: tiZANidine (ZANAFLEX) 4 MG tablet  Today we started Cymbalta 30 mg to treat neuropathy. We will plan on blood work next visit.   At least 150 minutes of moderate exercise per week, daily brisk walking for 15-30 min is a good exercise option. Healthy diet low in saturated (animal) fats and sweets and consisting of fresh fruits and vegetables, lean meats such as fish and white chicken and whole grains.  - Vaccines:  Tdap vaccine every 10 years.  Shingles vaccine recommended at age 15, could be given after 69 years of age but not sure about insurance coverage.  Pneumonia vaccines:  Prevnar 35 at 83 and Pneumovax at 37.   -Screening recommendations for low/normal risk males:  Screening for diabetes at age 70 and every 3 years. Earlier screening if cardiovascular risk factors.  Colon cancer screening at age 4 and until age 50.  Prostate cancer screening: some controversy, starts usually at 55: Rectal exam and PSA.  Aortic Abdominal Aneurism once between 31 and 74 years old if ever smoker.  Also recommended:  1. Dental visit- Brush and floss your teeth twice daily; visit your dentist twice a year. 2. Eye doctor- Get an eye exam at least every 2 years. 3. Helmet use- Always wear a helmet when riding a bicycle, motorcycle, rollerblading or skateboarding. 4. Safe sex- If you may be exposed to sexually transmitted infections, use a condom. 5. Seat belts- Seat belts can save your live; always wear one. 6. Smoke/Carbon Monoxide detectors- These detectors need to be installed on the appropriate level of your home. Replace batteries at least once a year. 7. Skin cancer- When out in the sun please cover up and use sunscreen 15 SPF or higher. 8. Violence- If anyone is threatening or hurting you, please tell your healthcare provider.  9. Drink alcohol  in moderation- Limit alcohol intake to one drink or less per day. Never drink and drive.  Please be sure medication list is accurate. If a new problem present, please set up appointment sooner than planned today.
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
## 2019-03-08 ENCOUNTER — Other Ambulatory Visit: Payer: Self-pay | Admitting: Family Medicine

## 2019-03-08 DIAGNOSIS — R062 Wheezing: Secondary | ICD-10-CM

## 2019-03-09 ENCOUNTER — Encounter: Payer: Self-pay | Admitting: Family Medicine

## 2019-03-12 ENCOUNTER — Other Ambulatory Visit: Payer: Self-pay | Admitting: Family Medicine

## 2019-04-06 ENCOUNTER — Ambulatory Visit: Payer: 59 | Admitting: Cardiology

## 2019-04-06 ENCOUNTER — Encounter: Payer: Self-pay | Admitting: Cardiology

## 2019-04-06 ENCOUNTER — Other Ambulatory Visit: Payer: Self-pay

## 2019-04-06 VITALS — BP 132/85 | HR 66 | Temp 98.0°F | Ht 72.0 in | Wt 236.0 lb

## 2019-04-06 DIAGNOSIS — R0609 Other forms of dyspnea: Secondary | ICD-10-CM | POA: Diagnosis not present

## 2019-04-06 DIAGNOSIS — I10 Essential (primary) hypertension: Secondary | ICD-10-CM | POA: Diagnosis not present

## 2019-04-06 DIAGNOSIS — R9439 Abnormal result of other cardiovascular function study: Secondary | ICD-10-CM | POA: Diagnosis not present

## 2019-04-06 NOTE — Progress Notes (Signed)
Patient referred by [name], [name], MD for exertional chest pain and dyspnea  Subjective:   Willie Dominguez, male    DOB: 1950-12-31, 69 y.o.   MRN: [id number]  Chief Complaint  Patient presents with  . Chest Pain  . Hypertension  . Shortness of Breath  . Follow-up    3 month    HPI  69 year old Caucasian male with hypertension, hyperlipidemia, history of bilateral lower extremity DVT and PE in 01/2018, family h/o CAD,with exertional dyspnea.  Stress test was intermediate risk with no definite perfusion abnormality, but mildly dilated in stress images with LV end-diastolic volume of 671 mL, and  mild mid anterior and septal hypokinesis.  LVEF was at the lower limit of normal at 50%. Echocardiogram showed normal systolic and diastolic function, mild LA dilatation, mild MR, mild TR> PASP was estimated at 23 mmHg, thus not showing evidence of pulmonary hypertension. Of note, he had CTA chest in 03/2018 that did not show PE, but showed mild bronchial wall thickening seen with bronchitis or reactive airway disease, and old granulomatous disease. He has not seen a pulmonologist since last visit. He continues to have exertional dyspnea with minimal activity.  Since his last visit, he underwent colonoscopy and was found to have a large polyp. He will likely need removal of this at [facility]. He does not report any bleeding at this time.  Current Outpatient Medications on File Prior to Visit  Medication Sig Dispense Refill  . amLODipine (NORVASC) 5 MG tablet Take 1 tablet (5 mg total) by mouth daily. 30 tablet 3  . aspirin EC 81 MG tablet Take 81 mg by mouth daily.    . cholecalciferol (VITAMIN D3) 25 MCG (1000 UT) tablet Take 1,000 Units by mouth daily.    [name] co-enzyme Q-10 50 MG capsule Take 100 mg by mouth daily.    . DULoxetine (CYMBALTA) 30 MG capsule Take 1 capsule (30 mg total) by mouth daily. 30 capsule 1  . nitroGLYCERIN (NITROSTAT) 0.4 MG SL tablet Place 1 tablet (0.4 mg total)  under the tongue every 5 (five) minutes as needed for chest pain. (Patient not taking: Reported on 03/07/2019) 30 tablet 3  . rosuvastatin (CRESTOR) 10 MG tablet TAKE 1 TABLET BY MOUTH EVERY DAY 90 tablet 3  . valsartan (DIOVAN) 80 MG tablet TAKE 1 TABLET BY MOUTH EVERY DAY 30 tablet 2  . vitamin B-12 (CYANOCOBALAMIN) 1000 MCG tablet Take 1,000 mcg by mouth daily.    . vitamin E 200 UNIT capsule Take 180 Units by mouth daily.     No current facility-administered medications on file prior to visit.    Cardiovascular studies:  [place] Stress Test 12/26/2018: Stress EKG is non-diagnostic, as this is pharmacological stress test. Although the t.i.d. index was normal at 0.95, left ventricle appeared to be mildly dilated in stress images with LV end-diastolic volume of 245 mL. The perfusion images reveals normal perfusion, however gated images reveal suggestion of mild mid anterior and septal hypokinesis.  LVEF was at the lower limit of normal at 50%. Intermediate risk study.  Clinical correlation recommended. No previous exam available for comparison.  Echocardiogram 12/02/2018: Left ventricle cavity is normal in size. Mild concentric hypertrophy of the left ventricle. Normal LV systolic function with EF 55%. Normal global wall motion. Normal diastolic filling pattern.  The aortic root is mildly dilated at 4.1 cm. Left atrial cavity is normal in size. Aneurysmal interatrial septum without 2D or color Doppler evidence of interatrial  shunt. Right ventricle cavity is mildly dilated. Normal right ventricular function. Trileaflet aortic valve. Trace aortic regurgitation. Mild (Grade I) mitral regurgitation. Mild tricuspid regurgitation. Estimated pulmonary artery systolic pressure is 23 mmHg. No significant change compared to previous study on 02/17/2018.   EKG 11/24/2018: Sinus rhythm 66 bpm. Left ventricular hypertrophy.   CTA chest 03/2018: 1. No residual or acute pulmonary embolism. 2.  Mild bronchial wall thickening seen with bronchitis or reactive airway disease. No pneumonia. 3. Old granulomatous disease.  CTA chest 01/2018: Positive for bilateral pulmonary emboli. Positive for acute PE with CT evidence of right heart strain (RV/LV Ratio = 1.3) consistent with at least submassive (intermediate risk) PE. The presence of right heart strain has been associated with an increased risk of morbidity and mortality.  LE venous duplex US 03/2018: Left: Findings consistent with age indeterminate deep vein thrombosis involving the left popliteal vein, and left peroneal vein. No cystic structure found in the popliteal fossa. Compared to previous study, femoral vein and gastroc vein thrombus has  Resolved.  LE venous duplex US 01/2018: Right: Findings consistent with acute deep vein thrombosis involving the right peroneal vein. Left: Findings consistent with acute deep vein thrombosis involving the left proximal femoral vein, left gastrocnemius vein, left popliteal vein, left posterior tibial vein, and left peroneal vein.   Echocardiogram 01/2018: Normal-sized LV.  EF 60-65%.  Normal diastolic function. Mildly dilated ascending aorta 4.2 cm. No significant valvular abnormality. Mildly dilated RV with normal systolic function.  RVSP 27 mmHg.  Nuclear stress test 09/2016:  The left ventricular ejection fraction is mildly decreased (45-54%).  Nuclear stress EF: 52%.  Blood pressure demonstrated a blunted response to exercise.  There was no ST segment deviation noted during stress.  Defect 1: There is a small defect of moderate severity present in the apex location.  This is a low risk study.   Low risk stress nuclear study with very mild apical ischemia; EF 52 (low normal to mild global reduction in LV function); mild LVE.    Recent labs: 10/10/2018: Glucose 107, BUN/Cr 14/1.12. EGFR 65. Na/K 140/4.4. Rest of the CMP normal H/H 14/42. MCV 92. Platelets 152 Chol 192,  TG 131, HDL 44, LDL 123     Review of Systems  Constitution: Negative for decreased appetite, malaise/fatigue, weight gain and weight loss.  HENT: Negative for congestion.   Eyes: Negative for visual disturbance.  Cardiovascular: Positive for chest pain and dyspnea on exertion. Negative for leg swelling, palpitations and syncope.  Respiratory: Negative for cough.   Endocrine: Negative for cold intolerance.  Hematologic/Lymphatic: Does not bruise/bleed easily.  Skin: Negative for itching and rash.  Musculoskeletal: Negative for myalgias.  Gastrointestinal: Negative for abdominal pain, nausea and vomiting.  Genitourinary: Negative for dysuria.  Neurological: Negative for dizziness and weakness.  Psychiatric/Behavioral: The patient is not nervous/anxious.   All other systems reviewed and are negative.        Vitals:   04/06/19 1504  BP: 132/85  Pulse: 66  Temp: 98 F (36.7 C)  SpO2: 97%     Body mass index is 32.01 kg/m. Filed Weights   04/06/19 1504  Weight: 236 lb (107 kg)     Objective:   Physical Exam  Not performed. Telephone visit.         Assessment & Recommendations:   69 year old Caucasian male with hypertension, hyperlipidemia, history of bilateral lower extremity DVT and PE in 01/2018, family h/o CAD,with exertional dyspnea.  Exertional dyspnea: Workup showed no evidence of  pulmonary hypertension, and showed equivocal stress test findings, as detailed above. Shortness of breath could indicate equivalent angina, or alternate etiology. Pulmonary appointment and workup is pending.   I discussed with him awaiting pulmonology workup vs proceeding with right and left heart cath, coronary angiography to rule put obstructive CAD and pulmonary hypertension. He has been awaiting a long time and would like to proceed with cardiac workup.  Recent diagnosis of large colon polyp, although without bleeding, makes workup and management of potential obstructive CAD  complicated. Should he have obstructive CAD requiring stenting, he will need to be on at least 3 months of DAPT. I will check with Dr. Mallie Mussel regarding his thoughts on this.   Continue current medical therapy.   Hypertension: Controlled.   Nigel Mormon, MD [facility] [place] Pager: [phone] Office: [phone] If no answer Cell [phone]

## 2019-04-11 ENCOUNTER — Other Ambulatory Visit: Payer: Self-pay | Admitting: Family Medicine

## 2019-04-11 DIAGNOSIS — I10 Essential (primary) hypertension: Secondary | ICD-10-CM

## 2019-04-14 ENCOUNTER — Other Ambulatory Visit (HOSPITAL_COMMUNITY)
Admission: RE | Admit: 2019-04-14 | Discharge: 2019-04-14 | Disposition: A | Discharge status: Home / Self Care | Payer: 59 | Source: Ambulatory Visit | Attending: Cardiology | Admitting: Cardiology

## 2019-04-14 ENCOUNTER — Other Ambulatory Visit: Payer: Self-pay | Admitting: Cardiology

## 2019-04-14 DIAGNOSIS — I10 Essential (primary) hypertension: Secondary | ICD-10-CM

## 2019-04-14 DIAGNOSIS — Z20822 Contact with and (suspected) exposure to covid-19: Secondary | ICD-10-CM | POA: Insufficient documentation

## 2019-04-14 DIAGNOSIS — Z01812 Encounter for preprocedural laboratory examination: Secondary | ICD-10-CM | POA: Insufficient documentation

## 2019-04-14 LAB — SARS CORONAVIRUS 2 (TAT 6-24 HRS): SARS Coronavirus 2: NEGATIVE

## 2019-04-14 NOTE — Progress Notes (Signed)
BMP and CBC order placed

## 2019-04-15 LAB — CBC
Hematocrit: 40.2 % (ref 37.5–51.0)
Hemoglobin: 13.6 g/dL (ref 13.0–17.7)
MCH: 31.1 pg (ref 26.6–33.0)
MCHC: 33.8 g/dL (ref 31.5–35.7)
MCV: 92 fL (ref 79–97)
Platelets: 149 10*3/uL — ABNORMAL LOW (ref 150–450)
RBC: 4.38 x10E6/uL (ref 4.14–5.80)
RDW: 12.4 % (ref 11.6–15.4)
WBC: 5 10*3/uL (ref 3.4–10.8)

## 2019-04-15 LAB — BASIC METABOLIC PANEL
BUN/Creatinine Ratio: 14 (ref 10–24)
BUN: 16 mg/dL (ref 8–27)
CO2: 24 mmol/L (ref 20–29)
Calcium: 9.4 mg/dL (ref 8.6–10.2)
Chloride: 106 mmol/L (ref 96–106)
Creatinine, Ser: 1.15 mg/dL (ref 0.76–1.27)
GFR calc Af Amer: 75 mL/min/{1.73_m2} (ref >59)
GFR calc non Af Amer: 65 mL/min/{1.73_m2} (ref >59)
Glucose: 75 mg/dL (ref 65–99)
Potassium: 4.3 mmol/L (ref 3.5–5.2)
Sodium: 141 mmol/L (ref 134–144)

## 2019-04-18 ENCOUNTER — Other Ambulatory Visit: Payer: Self-pay

## 2019-04-18 ENCOUNTER — Ambulatory Visit (HOSPITAL_COMMUNITY)
Admission: RE | Admit: 2019-04-18 | Discharge: 2019-04-18 | Disposition: A | Discharge status: Home / Self Care | Payer: 59 | Attending: Cardiology | Admitting: Cardiology

## 2019-04-18 ENCOUNTER — Encounter (HOSPITAL_COMMUNITY)
Admission: RE | Disposition: A | Discharge status: Home / Self Care | Payer: Self-pay | Source: Home / Self Care | Attending: Cardiology

## 2019-04-18 DIAGNOSIS — E785 Hyperlipidemia, unspecified: Secondary | ICD-10-CM | POA: Diagnosis not present

## 2019-04-18 DIAGNOSIS — I2510 Atherosclerotic heart disease of native coronary artery without angina pectoris: Secondary | ICD-10-CM | POA: Insufficient documentation

## 2019-04-18 DIAGNOSIS — R9439 Abnormal result of other cardiovascular function study: Secondary | ICD-10-CM | POA: Diagnosis not present

## 2019-04-18 DIAGNOSIS — Z7982 Long term (current) use of aspirin: Secondary | ICD-10-CM | POA: Diagnosis not present

## 2019-04-18 DIAGNOSIS — R0609 Other forms of dyspnea: Secondary | ICD-10-CM | POA: Diagnosis present

## 2019-04-18 DIAGNOSIS — I10 Essential (primary) hypertension: Secondary | ICD-10-CM | POA: Diagnosis not present

## 2019-04-18 DIAGNOSIS — Z8249 Family history of ischemic heart disease and other diseases of the circulatory system: Secondary | ICD-10-CM | POA: Insufficient documentation

## 2019-04-18 DIAGNOSIS — Z86718 Personal history of other venous thrombosis and embolism: Secondary | ICD-10-CM | POA: Insufficient documentation

## 2019-04-18 DIAGNOSIS — Z79899 Other long term (current) drug therapy: Secondary | ICD-10-CM | POA: Insufficient documentation

## 2019-04-18 DIAGNOSIS — Z95820 Peripheral vascular angioplasty status with implants and grafts: Secondary | ICD-10-CM

## 2019-04-18 DIAGNOSIS — R0600 Dyspnea, unspecified: Secondary | ICD-10-CM | POA: Diagnosis not present

## 2019-04-18 DIAGNOSIS — R0602 Shortness of breath: Secondary | ICD-10-CM | POA: Diagnosis present

## 2019-04-18 HISTORY — PX: INTRAVASCULAR PRESSURE WIRE/FFR STUDY: CATH118243

## 2019-04-18 HISTORY — PX: RIGHT/LEFT HEART CATH AND CORONARY ANGIOGRAPHY: CATH118266

## 2019-04-18 HISTORY — PX: CORONARY STENT INTERVENTION: CATH118234

## 2019-04-18 LAB — POCT I-STAT 7, (LYTES, BLD GAS, ICA,H+H)
Bicarbonate: 25.2 mmol/L (ref 20.0–28.0)
Calcium, Ion: 1.18 mmol/L (ref 1.15–1.40)
HCT: 37 % — ABNORMAL LOW (ref 39.0–52.0)
Hemoglobin: 12.6 g/dL — ABNORMAL LOW (ref 13.0–17.0)
O2 Saturation: 99 %
Potassium: 4.1 mmol/L (ref 3.5–5.1)
Sodium: 140 mmol/L (ref 135–145)
TCO2: 27 mmol/L (ref 22–32)
pCO2 arterial: 41.9 mmHg (ref 32.0–48.0)
pH, Arterial: 7.388 (ref 7.350–7.450)
pO2, Arterial: 119 mmHg — ABNORMAL HIGH (ref 83.0–108.0)

## 2019-04-18 LAB — POCT I-STAT EG7
Bicarbonate: 25.7 mmol/L (ref 20.0–28.0)
Calcium, Ion: 1.22 mmol/L (ref 1.15–1.40)
HCT: 37 % — ABNORMAL LOW (ref 39.0–52.0)
Hemoglobin: 12.6 g/dL — ABNORMAL LOW (ref 13.0–17.0)
O2 Saturation: 74 %
Potassium: 4.2 mmol/L (ref 3.5–5.1)
Sodium: 139 mmol/L (ref 135–145)
TCO2: 27 mmol/L (ref 22–32)
pCO2, Ven: 45.2 mmHg (ref 44.0–60.0)
pH, Ven: 7.362 (ref 7.250–7.430)
pO2, Ven: 41 mmHg (ref 32.0–45.0)

## 2019-04-18 LAB — POCT ACTIVATED CLOTTING TIME
Activated Clotting Time: 219 seconds
Activated Clotting Time: 230 seconds
Activated Clotting Time: 345 seconds

## 2019-04-18 SURGERY — RIGHT/LEFT HEART CATH AND CORONARY ANGIOGRAPHY
Anesthesia: LOCAL

## 2019-04-18 MED ORDER — SODIUM CHLORIDE 0.9 % IV SOLN: INTRAVENOUS | Status: AC

## 2019-04-18 MED ORDER — VERAPAMIL HCL 2.5 MG/ML IV SOLN
INTRAVENOUS | Status: DC | PRN
  Administered 2019-04-18: 10 mL via INTRA_ARTERIAL

## 2019-04-18 MED ORDER — FENTANYL CITRATE (PF) 100 MCG/2ML IJ SOLN
INTRAMUSCULAR | Status: AC
  Filled 2019-04-18: qty 2

## 2019-04-18 MED ORDER — HEPARIN SODIUM (PORCINE) 1000 UNIT/ML IJ SOLN
INTRAMUSCULAR | Status: AC
  Filled 2019-04-18: qty 1

## 2019-04-18 MED ORDER — LIDOCAINE HCL (PF) 1 % IJ SOLN
INTRAMUSCULAR | Status: DC | PRN
  Administered 2019-04-18 (×2): 2 mL

## 2019-04-18 MED ORDER — SODIUM CHLORIDE 0.9 % WEIGHT BASED INFUSION: 1.0000 mL/kg/h | INTRAVENOUS | Status: DC

## 2019-04-18 MED ORDER — NITROGLYCERIN 1 MG/10 ML FOR IR/CATH LAB
INTRA_ARTERIAL | Status: DC | PRN
  Administered 2019-04-18 (×2): 200 ug via INTRACORONARY

## 2019-04-18 MED ORDER — SODIUM CHLORIDE 0.9 % IV SOLN: 250.0000 mL | INTRAVENOUS | Status: DC | PRN

## 2019-04-18 MED ORDER — IOHEXOL 350 MG/ML SOLN
INTRAVENOUS | Status: DC | PRN
  Administered 2019-04-18: 165 mL

## 2019-04-18 MED ORDER — ROSUVASTATIN CALCIUM 20 MG PO TABS: 20.0000 mg | ORAL_TABLET | Freq: Every day | ORAL | 3 refills | Status: DC

## 2019-04-18 MED ORDER — NITROGLYCERIN 1 MG/10 ML FOR IR/CATH LAB
INTRA_ARTERIAL | Status: AC
  Filled 2019-04-18: qty 10

## 2019-04-18 MED ORDER — CLOPIDOGREL BISULFATE 300 MG PO TABS
ORAL_TABLET | ORAL | Status: AC
  Filled 2019-04-18: qty 2

## 2019-04-18 MED ORDER — MIDAZOLAM HCL 2 MG/2ML IJ SOLN
INTRAMUSCULAR | Status: DC | PRN
  Administered 2019-04-18: 1 mg via INTRAVENOUS

## 2019-04-18 MED ORDER — SODIUM CHLORIDE 0.9% FLUSH: 3.0000 mL | Freq: Two times a day (BID) | INTRAVENOUS | Status: DC

## 2019-04-18 MED ORDER — FENTANYL CITRATE (PF) 100 MCG/2ML IJ SOLN
INTRAMUSCULAR | Status: DC | PRN
  Administered 2019-04-18: 25 ug via INTRAVENOUS

## 2019-04-18 MED ORDER — ACETAMINOPHEN 325 MG PO TABS: 650.0000 mg | ORAL_TABLET | ORAL | Status: DC | PRN

## 2019-04-18 MED ORDER — HEPARIN (PORCINE) IN NACL 1000-0.9 UT/500ML-% IV SOLN
INTRAVENOUS | Status: DC | PRN
  Administered 2019-04-18 (×2): 500 mL

## 2019-04-18 MED ORDER — LIDOCAINE HCL (PF) 1 % IJ SOLN
INTRAMUSCULAR | Status: AC
  Filled 2019-04-18: qty 30

## 2019-04-18 MED ORDER — ASPIRIN 81 MG PO CHEW
81.0000 mg | CHEWABLE_TABLET | ORAL | Status: AC
  Administered 2019-04-18: 81 mg via ORAL
  Filled 2019-04-18: qty 1

## 2019-04-18 MED ORDER — VERAPAMIL HCL 2.5 MG/ML IV SOLN
INTRAVENOUS | Status: AC
  Filled 2019-04-18: qty 2

## 2019-04-18 MED ORDER — HEPARIN SODIUM (PORCINE) 1000 UNIT/ML IJ SOLN
INTRAMUSCULAR | Status: DC | PRN
  Administered 2019-04-18: 3000 [IU] via INTRAVENOUS
  Administered 2019-04-18 (×2): 5000 [IU] via INTRAVENOUS

## 2019-04-18 MED ORDER — SODIUM CHLORIDE 0.9% FLUSH: 3.0000 mL | INTRAVENOUS | Status: DC | PRN

## 2019-04-18 MED ORDER — HEPARIN (PORCINE) IN NACL 1000-0.9 UT/500ML-% IV SOLN
INTRAVENOUS | Status: AC
  Filled 2019-04-18: qty 1000

## 2019-04-18 MED ORDER — CLOPIDOGREL BISULFATE 300 MG PO TABS
ORAL_TABLET | ORAL | Status: DC | PRN
  Administered 2019-04-18: 600 mg via ORAL

## 2019-04-18 MED ORDER — MIDAZOLAM HCL 2 MG/2ML IJ SOLN
INTRAMUSCULAR | Status: AC
  Filled 2019-04-18: qty 2

## 2019-04-18 MED ORDER — SODIUM CHLORIDE 0.9 % WEIGHT BASED INFUSION
3.0000 mL/kg/h | INTRAVENOUS | Status: AC
  Administered 2019-04-18: 08:00:00 3 mL/kg/h via INTRAVENOUS

## 2019-04-18 MED ORDER — CLOPIDOGREL BISULFATE 75 MG PO TABS: 75.0000 mg | ORAL_TABLET | Freq: Every day | ORAL | Status: DC

## 2019-04-18 MED ORDER — CLOPIDOGREL BISULFATE 75 MG PO TABS: 75.0000 mg | ORAL_TABLET | Freq: Every day | ORAL | 1 refills | Status: DC

## 2019-04-18 MED ORDER — ROSUVASTATIN CALCIUM 20 MG PO TABS: 10.0000 mg | ORAL_TABLET | Freq: Every day | ORAL | 3 refills | Status: DC

## 2019-04-18 MED ORDER — HEPARIN SODIUM (PORCINE) 1000 UNIT/ML IJ SOLN
INTRAMUSCULAR | Status: DC | PRN
  Administered 2019-04-18: 4000 [IU] via INTRAVENOUS

## 2019-04-18 MED ORDER — HYDRALAZINE HCL 20 MG/ML IJ SOLN: 10.0000 mg | INTRAMUSCULAR | Status: DC | PRN

## 2019-04-18 MED ORDER — ONDANSETRON HCL 4 MG/2ML IJ SOLN: 4.0000 mg | Freq: Four times a day (QID) | INTRAMUSCULAR | Status: DC | PRN

## 2019-04-18 MED FILL — CLOPIDOGREL 75 MG TABLET: 75 | 30 days supply | Qty: 30 | Fill #0

## 2019-04-18 SURGICAL SUPPLY — 27 items
BALLN EMERGE MR 2.5X12 (BALLOONS) ×2
BALLN SAPPHIRE ~~LOC~~ 3.0X8 (BALLOONS) ×2 IMPLANT
BALLN WOLVERINE 2.75X6 (BALLOONS) ×2
BALLOON EMERGE MR 2.5X12 (BALLOONS) ×1 IMPLANT
BALLOON WOLVERINE 2.75X6 (BALLOONS) ×1 IMPLANT
CATH BALLN WEDGE 5F 110CM (CATHETERS) ×2 IMPLANT
CATH INFINITI 5 FR JL3.5 (CATHETERS) ×2 IMPLANT
CATH INFINITI JR4 5F (CATHETERS) ×2 IMPLANT
CATH MICROCATH NAVVUS (MICROCATHETER) IMPLANT
CATH VISTA GUIDE 6FR XBLAD3.5 (CATHETERS) ×2 IMPLANT
DEVICE RAD COMP TR BAND LRG (VASCULAR PRODUCTS) ×2 IMPLANT
GLIDESHEATH SLEND A-KIT 6F 22G (SHEATH) ×2 IMPLANT
GUIDEWIRE .025 260CM (WIRE) ×2 IMPLANT
GUIDEWIRE INQWIRE 1.5J.035X260 (WIRE) ×1 IMPLANT
GUIDEWIRE PRESSURE COMET II (WIRE) ×2 IMPLANT
INQWIRE 1.5J .035X260CM (WIRE) ×2
KIT ENCORE 26 ADVANTAGE (KITS) ×2 IMPLANT
KIT HEART LEFT (KITS) ×2 IMPLANT
KIT HEMO VALVE WATCHDOG (MISCELLANEOUS) ×2 IMPLANT
MICROCATHETER NAVVUS (MICROCATHETER)
PACK CARDIAC CATHETERIZATION (CUSTOM PROCEDURE TRAY) ×2 IMPLANT
SHEATH GLIDE SLENDER 4/5FR (SHEATH) ×2 IMPLANT
STENT RESOLUTE ONYX 2.75X12 (Permanent Stent) ×2 IMPLANT
TRANSDUCER W/STOPCOCK (MISCELLANEOUS) ×2 IMPLANT
TUBING CIL FLEX 10 FLL-RA (TUBING) ×2 IMPLANT
WIRE EMERALD 3MM-J .025X260CM (WIRE) ×2 IMPLANT
WIRE RUNTHROUGH .014X180CM (WIRE) ×2 IMPLANT

## 2019-04-18 NOTE — Interval H&P Note (Signed)
History and Physical Interval Note:  04/18/2019 9:38 AM  Willie Dominguez  has presented today for surgery, with the diagnosis of Positive stress test.  The various methods of treatment have been discussed with the patient and family. After consideration of risks, benefits and other options for treatment, the patient has consented to  Procedure(s): RIGHT/LEFT HEART CATH AND CORONARY ANGIOGRAPHY (N/A) as a surgical intervention.  The patient's history has been reviewed, patient examined, no change in status, stable for surgery.  I have reviewed the patient's chart and labs.  Questions were answered to the patient's satisfaction.    2012 Appropriate Use Criteria for Diagnostic Catheterization Home / Select Test of Interest Indication for RHC Pulmonary Hypertension Pulmonary Hypertension (Right Heart Catheterization) Pulmonary Hypertension  (Right Heart Catheterization) Link Here: [email or web address] Indication:  Suspected pulmonary artery hypertension Equivocal or borderline elevated estimated right ventricular systolic pressure on resting echo study A (7) Indication: 97; Score 7 294  Kaikoa Magro J Siddiq Kaluzny

## 2019-04-18 NOTE — Interval H&P Note (Signed)
History and Physical Interval Note:  04/18/2019 9:37 AM  Willie Dominguez  has presented today for surgery, with the diagnosis of Positive stress test.  The various methods of treatment have been discussed with the patient and family. After consideration of risks, benefits and other options for treatment, the patient has consented to  Procedure(s): RIGHT/LEFT HEART CATH AND CORONARY ANGIOGRAPHY (N/A) as a surgical intervention.  The patient's history has been reviewed, patient examined, no change in status, stable for surgery.  I have reviewed the patient's chart and labs.  Questions were answered to the patient's satisfaction.     2016/2017 Appropriate Use Criteria for Coronary Revascularization Clinical Presentation: Diabetes Mellitus? Symptom Status? S/P CABG? Antianginal Therapy (# of long-acting drugs)? Results of Non-invasive testing? FFR/iFR results in all diseased vessels? Patient undergoing renal transplant? Patient undergoing percutaneous valve procedure (TAVR, MitraClip, Others)? Symptom Status:  Ischemic Symptoms  Non-invasive Testing:  Intermediate Risk  If no or indeterminate stress test, FFR/iFR results in all diseased vessels:  N/A  Diabetes Mellitus:  No  S/P CABG:  No  Antianginal therapy (number of long-acting drugs):  >=2  Patient undergoing renal transplant:  No  Patient undergoing percutaneous valve procedure:  No    newline 1 Vessel Disease PCI CABG  No proximal LAD involvement, No proximal left dominant LCX involvement A (8); Indication 2 M (6); Indication 2   Proximal left dominant LCX involvement A (8); Indication 5 A (8); Indication 5   Proximal LAD involvement A (8); Indication 5 A (8); Indication 5   newline 2 Vessel Disease  No proximal LAD involvement A (8); Indication 8 A (7); Indication 8   Proximal LAD involvement A (8); Indication 11 A (8); Indication 11   newline 3 Vessel Disease  Low disease complexity (e.g., focal stenoses, SYNTAX <=22) A (8);  Indication 17 A (8); Indication 17   Intermediate or high disease complexity (e.g., SYNTAX >=23) M (6); Indication 21 A (9); Indication 21   newline Left Main Disease  Isolated LMCA disease: ostial or midshaft A (7); Indication 24 A (9); Indication 24   Isolated LMCA disease: bifurcation involvement M (6); Indication 25 A (9); Indication 25   LMCA ostial or midshaft, concurrent low disease burden multivessel disease (e.g., 1-2 additional focal stenoses, SYNTAX <=22) A (7); Indication 26 A (9); Indication 26   LMCA ostial or midshaft, concurrent intermediate or high disease burden multivessel disease (e.g., 1-2 additional bifurcation stenoses, long stenoses, SYNTAX >=23) M (4); Indication 27 A (9); Indication 27   LMCA bifurcation involvement, concurrent low disease burden multivessel disease (e.g., 1-2 additional focal stenoses, SYNTAX <=22) M (6); Indication 28 A (9); Indication 28   LMCA bifurcation involvement, concurrent intermediate or high disease burden multivessel disease (e.g., 1-2 additional bifurcation stenoses, long stenoses, SYNTAX >=23) R (3); Indication 29 A (9); Indication [place]

## 2019-04-18 NOTE — Progress Notes (Signed)
[id number] Education completed with pt who voiced understanding. Stressed importance of plavix with stent. Reviewed NTG use, heart healthy food choices, walking for ex, risk factors, CRP 2. Referred to [place] CRP 2. Pt is interested in participating in Virtual Cardiac and Pulmonary Rehab. Pt advised that Virtual Cardiac and Pulmonary Rehab is provided at no cost to the patient.  Checklist:  1. Pt has smart device  ie smartphone and/or ipad for downloading an app  Yes 2. Reliable internet/wifi service    Yes 3. Understands how to use their smartphone and navigate within an app.  Yes   Pt verbalized understanding and is in agreement.

## 2019-04-18 NOTE — Discharge Instructions (Signed)
Radial Site Care  This sheet gives you information about how to care for yourself after your procedure. Your health care provider may also give you more specific instructions. If you have problems or questions, contact your health care provider. What can I expect after the procedure? After the procedure, it is common to have:  Bruising and tenderness at the catheter insertion area. Follow these instructions at home: Medicines  Take over-the-counter and prescription medicines only as told by your health care provider. Insertion site care  Follow instructions from your health care provider about how to take care of your insertion site. Make sure you: ? Wash your hands with soap and water before you change your bandage (dressing). If soap and water are not available, use hand sanitizer. ? Change your dressing as told by your health care provider. ? Leave stitches (sutures), skin glue, or adhesive strips in place. These skin closures may need to stay in place for 2 weeks or longer. If adhesive strip edges start to loosen and curl up, you may trim the loose edges. Do not remove adhesive strips completely unless your health care provider tells you to do that.  Check your insertion site every day for signs of infection. Check for: ? Redness, swelling, or pain. ? Fluid or blood. ? Pus or a bad smell. ? Warmth.  Do not take baths, swim, or use a hot tub until your health care provider approves.  You may shower 24-48 hours after the procedure, or as directed by your health care provider. ? Remove the dressing and gently wash the site with plain soap and water. ? Pat the area dry with a clean towel. ? Do not rub the site. That could cause bleeding.  Do not apply powder or lotion to the site. Activity   For 24 hours after the procedure, or as directed by your health care provider: ? Do not flex or bend the affected arm. ? Do not push or pull heavy objects with the affected arm. ? Do not  drive yourself home from the hospital or clinic. You may drive 24 hours after the procedure unless your health care provider tells you not to. ? Do not operate machinery or power tools.  Do not lift anything that is heavier than 10 lb (4.5 kg), or the limit that you are told, until your health care provider says that it is safe.  Ask your health care provider when it is okay to: ? Return to work or school. ? Resume usual physical activities or sports. ? Resume sexual activity. General instructions  If the catheter site starts to bleed, raise your arm and put firm pressure on the site. If the bleeding does not stop, get help right away. This is a medical emergency.  If you went home on the same day as your procedure, a responsible adult should be with you for the first 24 hours after you arrive home.  Keep all follow-up visits as told by your health care provider. This is important. Contact a health care provider if:  You have a fever.  You have redness, swelling, or yellow drainage around your insertion site. Get help right away if:  You have unusual pain at the radial site.  The catheter insertion area swells very fast.  The insertion area is bleeding, and the bleeding does not stop when you hold steady pressure on the area.  Your arm or hand becomes pale, cool, tingly, or numb. These symptoms may represent a serious problem   that is an emergency. Do not wait to see if the symptoms will go away. Get medical help right away. Call your local emergency services (911 in the [place] Do not drive yourself to the hospital. Summary  After the procedure, it is common to have bruising and tenderness at the site.  Follow instructions from your health care provider about how to take care of your radial site wound. Check the wound every day for signs of infection.  Do not lift anything that is heavier than 10 lb (4.5 kg), or the limit that you are told, until your health care provider says  that it is safe. This information is not intended to replace advice given to you by your health care provider. Make sure you discuss any questions you have with your health care provider. Document Revised: 03/24/2017 Document Reviewed: 03/24/2017 Elsevier Patient Education  2020 [employer]  

## 2019-04-18 NOTE — H&P (Signed)
OV 04/06/19 copied for documentation      Patient referred by No ref. provider found for exertional chest pain and dyspnea  Subjective:   Willie Dominguez, male    DOB: 03/29/1950, 69 y.o.   MRN: [id number]  No chief complaint on file.   HPI  69 year old Caucasian male with hypertension, hyperlipidemia, history of bilateral lower extremity DVT and PE in 01/2018, family h/o CAD,with exertional dyspnea.  Stress test was intermediate risk with no definite perfusion abnormality, but mildly dilated in stress images with LV end-diastolic volume of 144 mL, and  mild mid anterior and septal hypokinesis.  LVEF was at the lower limit of normal at 50%. Echocardiogram showed normal systolic and diastolic function, mild LA dilatation, mild MR, mild TR> PASP was estimated at 23 mmHg, thus not showing evidence of pulmonary hypertension. Of note, he had CTA chest in 03/2018 that did not show PE, but showed mild bronchial wall thickening seen with bronchitis or reactive airway disease, and old granulomatous disease. He has not seen a pulmonologist since last visit. He continues to have exertional dyspnea with minimal activity.  Since his last visit, he underwent colonoscopy and was found to have a large polyp. He will likely need removal of this at [facility]. He does not report any bleeding at this time.  No current facility-administered medications on file prior to encounter.   Current Outpatient Medications on File Prior to Encounter  Medication Sig Dispense Refill  . acetaminophen (TYLENOL) 500 MG tablet Take 500-1,000 mg by mouth every 6 (six) hours as needed (for pain.).    [name] amLODipine (NORVASC) 5 MG tablet Take 1 tablet (5 mg total) by mouth daily. 30 tablet 3  . aspirin EC 81 MG tablet Take 81 mg by mouth daily.    . Cholecalciferol (VITAMIN D-3) 125 MCG (5000 UT) TABS Take 5,000 Units by mouth daily.    . Coenzyme Q10 (COQ10) 100 MG CAPS Take 100 mg by mouth daily.    . rosuvastatin (CRESTOR)  10 MG tablet TAKE 1 TABLET BY MOUTH EVERY DAY (Patient taking differently: Take 10 mg by mouth daily. ) 90 tablet 3  . vitamin B-12 (CYANOCOBALAMIN) 1000 MCG tablet Take 1,000 mcg by mouth daily.    . nitroGLYCERIN (NITROSTAT) 0.4 MG SL tablet Place 1 tablet (0.4 mg total) under the tongue every 5 (five) minutes as needed for chest pain. 30 tablet 3    Cardiovascular studies:  Lexiscan Myoview Stress Test 12/26/2018: Stress EKG is non-diagnostic, as this is pharmacological stress test. Although the t.i.d. index was normal at 0.95, left ventricle appeared to be mildly dilated in stress images with LV end-diastolic volume of 818 mL. The perfusion images reveals normal perfusion, however gated images reveal suggestion of mild mid anterior and septal hypokinesis.  LVEF was at the lower limit of normal at 50%. Intermediate risk study.  Clinical correlation recommended. No previous exam available for comparison.  Echocardiogram 12/02/2018: Left ventricle cavity is normal in size. Mild concentric hypertrophy of the left ventricle. Normal LV systolic function with EF 55%. Normal global wall motion. Normal diastolic filling pattern.  The aortic root is mildly dilated at 4.1 cm. Left atrial cavity is normal in size. Aneurysmal interatrial septum without 2D or color Doppler evidence of interatrial shunt. Right ventricle cavity is mildly dilated. Normal right ventricular function. Trileaflet aortic valve. Trace aortic regurgitation. Mild (Grade I) mitral regurgitation. Mild tricuspid regurgitation. Estimated pulmonary artery systolic pressure is 23 mmHg. No significant change compared to  previous study on 02/17/2018.   EKG 11/24/2018: Sinus rhythm 66 bpm. Left ventricular hypertrophy.   CTA chest 03/2018: 1. No residual or acute pulmonary embolism. 2. Mild bronchial wall thickening seen with bronchitis or reactive airway disease. No pneumonia. 3. Old granulomatous disease.  CTA chest  01/2018: Positive for bilateral pulmonary emboli. Positive for acute PE with CT evidence of right heart strain (RV/LV Ratio = 1.3) consistent with at least submassive (intermediate risk) PE. The presence of right heart strain has been associated with an increased risk of morbidity and mortality.  LE venous duplex US 03/2018: Left: Findings consistent with age indeterminate deep vein thrombosis involving the left popliteal vein, and left peroneal vein. No cystic structure found in the popliteal fossa. Compared to previous study, femoral vein and gastroc vein thrombus has  Resolved.  LE venous duplex US 01/2018: Right: Findings consistent with acute deep vein thrombosis involving the right peroneal vein. Left: Findings consistent with acute deep vein thrombosis involving the left proximal femoral vein, left gastrocnemius vein, left popliteal vein, left posterior tibial vein, and left peroneal vein.   Echocardiogram 01/2018: Normal-sized LV.  EF 60-65%.  Normal diastolic function. Mildly dilated ascending aorta 4.2 cm. No significant valvular abnormality. Mildly dilated RV with normal systolic function.  RVSP 27 mmHg.  Nuclear stress test 09/2016:  The left ventricular ejection fraction is mildly decreased (45-54%).  Nuclear stress EF: 52%.  Blood pressure demonstrated a blunted response to exercise.  There was no ST segment deviation noted during stress.  Defect 1: There is a small defect of moderate severity present in the apex location.  This is a low risk study.   Low risk stress nuclear study with very mild apical ischemia; EF 52 (low normal to mild global reduction in LV function); mild LVE.    Recent labs: 10/10/2018: Glucose 107, BUN/Cr 14/1.12. EGFR 65. Na/K 140/4.4. Rest of the CMP normal H/H 14/42. MCV 92. Platelets 152 Chol 192, TG 131, HDL 44, LDL 123     Review of Systems  Constitution: Negative for decreased appetite, malaise/fatigue, weight gain and weight  loss.  HENT: Negative for congestion.   Eyes: Negative for visual disturbance.  Cardiovascular: Positive for chest pain and dyspnea on exertion. Negative for leg swelling, palpitations and syncope.  Respiratory: Negative for cough.   Endocrine: Negative for cold intolerance.  Hematologic/Lymphatic: Does not bruise/bleed easily.  Skin: Negative for itching and rash.  Musculoskeletal: Negative for myalgias.  Gastrointestinal: Negative for abdominal pain, nausea and vomiting.  Genitourinary: Negative for dysuria.  Neurological: Negative for dizziness and weakness.  Psychiatric/Behavioral: The patient is not nervous/anxious.   All other systems reviewed and are negative.        Vitals:   04/18/19 0750 04/18/19 0934  BP: 98/64   Pulse: 62   Resp: 17   Temp: 98.3 F (36.8 C)   SpO2: 98% 100%     Body mass index is 31.87 kg/m. Filed Weights   04/18/19 0750  Weight: 106.6 kg     Objective:   Physical Exam  Not performed. Telephone visit.         Assessment & Recommendations:   69 year old Caucasian male with hypertension, hyperlipidemia, history of bilateral lower extremity DVT and PE in 01/2018, family h/o CAD,with exertional dyspnea.  Exertional dyspnea: Workup showed no evidence of pulmonary hypertension, and showed equivocal stress test findings, as detailed above. Shortness of breath could indicate equivalent angina, or alternate etiology. Pulmonary appointment and workup is pending.   I  discussed with him awaiting pulmonology workup vs proceeding with right and left heart cath, coronary angiography to rule put obstructive CAD and pulmonary hypertension. He has been awaiting a long time and would like to proceed with cardiac workup.  Recent diagnosis of large colon polyp, although without bleeding, makes workup and management of potential obstructive CAD complicated. Should he have obstructive CAD requiring stenting, he will need to be on at least 3 months of DAPT.  I will check with Dr. Mallie Mussel regarding his thoughts on this.   Continue current medical therapy.   Hypertension: Controlled.   Nigel Mormon, MD [facility] [place] Pager: [phone] Office: [phone] If no answer Cell [phone]

## 2019-04-18 NOTE — Progress Notes (Signed)
Prox-mid LAD bifurcation PCI. Same day discharge.  Nigel Mormon, MD

## 2019-04-20 ENCOUNTER — Encounter (HOSPITAL_COMMUNITY): Payer: Self-pay | Admitting: *Deleted

## 2019-04-20 NOTE — Progress Notes (Signed)
Referral received and verified for MD signature.  Follow up appt is 04/27/19.  Insurance benefits and eligibility to be determined. Pt has covid risk score 4.  Pt will be contacted for scheduling upon satisfactorily completing his follow up appt on 2/25.Cherre Huger, BSN Cardiac and Training and development officer

## 2019-04-27 ENCOUNTER — Encounter: Payer: Self-pay | Admitting: Cardiology

## 2019-04-27 ENCOUNTER — Telehealth (HOSPITAL_COMMUNITY): Payer: Self-pay

## 2019-04-27 ENCOUNTER — Other Ambulatory Visit: Payer: Self-pay

## 2019-04-27 ENCOUNTER — Ambulatory Visit: Payer: 59 | Admitting: Cardiology

## 2019-04-27 VITALS — BP 123/81 | HR 63 | Temp 97.9°F | Ht 72.0 in | Wt 237.7 lb

## 2019-04-27 DIAGNOSIS — I2510 Atherosclerotic heart disease of native coronary artery without angina pectoris: Secondary | ICD-10-CM | POA: Insufficient documentation

## 2019-04-27 DIAGNOSIS — R059 Cough, unspecified: Secondary | ICD-10-CM | POA: Insufficient documentation

## 2019-04-27 DIAGNOSIS — I10 Essential (primary) hypertension: Secondary | ICD-10-CM

## 2019-04-27 DIAGNOSIS — I25118 Atherosclerotic heart disease of native coronary artery with other forms of angina pectoris: Secondary | ICD-10-CM

## 2019-04-27 DIAGNOSIS — R05 Cough: Secondary | ICD-10-CM | POA: Insufficient documentation

## 2019-04-27 DIAGNOSIS — R0602 Shortness of breath: Secondary | ICD-10-CM

## 2019-04-27 DIAGNOSIS — Z86711 Personal history of pulmonary embolism: Secondary | ICD-10-CM

## 2019-04-27 NOTE — Progress Notes (Signed)
Patient referred by [name], [name], MD for exertional chest pain and dyspnea  Subjective:   Willie Dominguez, male    DOB: [birth year], 69 y.o.   MRN: [id number]   Chief Complaint  Patient presents with  . Status post angioplasty with stent  . Hypertension  . Follow-up    stent placement    HPI  69 year old Caucasian male with hypertension, hyperlipidemia, history of bilateral lower extremity DVT and PE in 01/2018, family h/o CAD,with exertional dyspnea.   Patient originally saw me in September 2020 for symptoms of shortness of breath.  His symptoms seem to have worsened since his PE in December 2019.  However, on further review, the symptoms were present even prior to that.  He never had any significant chest pain symptoms.  Patient's subsequent CT angiogram in January 2020 did not show any PE. Work-up with echocardiogram did not show any pulmonary hypertension.  Stress test was equivocal.  I had initially referred him to [facility] pulmonology, but it appears that he never heard from them.  In the meantime, given his worsening symptoms all persistent shortness of breath, angina equivalent remains in the differential.  Thus, I recommended right and left heart catheterization and pulmonary angiography.    Cath showed no evidence of pulmonary hypertension, but showed severe small PDA bifurcation stenosis, severe stenosis and small caliber ramus, and dFR positive ventilatory stenosis.  He underwent successful LAD stenting with post PCI DFR showing residual nonsignificant disease distally.  Given the amount of contrast used, I decided to stage.  Intervention in the future, as clinically necessary.  Patient and wife are here for follow-up discussion today.  Continue to manage medication, patient has not had any improvement in her shortness of breath symptoms.  Matter-of-fact, shortness of breath has worsened in the past 1 week.  He shortness of breath is present at both rest and exertion.  He  also complains of dry cough, which is new.  He does not have any fever, chills, or other infectious symptoms.  He has had occasional "twinge" in his chest that lasts for a few seconds, but also has a separate chest pressure that is constant 24X7.    Current Outpatient Medications on File Prior to Visit  Medication Sig Dispense Refill  . acetaminophen (TYLENOL) 500 MG tablet Take 500-1,000 mg by mouth every 6 (six) hours as needed (for pain.).    [name] amLODipine (NORVASC) 5 MG tablet Take 1 tablet (5 mg total) by mouth daily. 30 tablet 3  . aspirin EC 81 MG tablet Take 81 mg by mouth daily.    . Cholecalciferol (VITAMIN D-3) 125 MCG (5000 UT) TABS Take 5,000 Units by mouth daily.    . clopidogrel (PLAVIX) 75 MG tablet Take 1 tablet (75 mg total) by mouth daily. 30 tablet 1  . Coenzyme Q10 (COQ10) 100 MG CAPS Take 100 mg by mouth daily.    . nitroGLYCERIN (NITROSTAT) 0.4 MG SL tablet Place 1 tablet (0.4 mg total) under the tongue every 5 (five) minutes as needed for chest pain. 30 tablet 3  . rosuvastatin (CRESTOR) 20 MG tablet Take 1 tablet (20 mg total) by mouth daily. Please disregard another order for `0 mg from earlier today 90 tablet 3  . valsartan (DIOVAN) 80 MG tablet TAKE 1 TABLET BY MOUTH EVERY DAY (Patient taking differently: Take 80 mg by mouth daily. ) 90 tablet 3  . vitamin B-12 (CYANOCOBALAMIN) 1000 MCG tablet Take 1,000 mcg by mouth daily.  No current facility-administered medications on file prior to visit.    Cardiovascular studies:  EKG 04/27/2019: Sinus rhythm 58 bpm.  LHC/RHC/coronary intervention 04/18/2019: LM: Normal LAD: Prox-mid 80% stenosis and LAD/D1 bifurcation (1,0,0). Moderate disease in prox Diag.         LAD dFR 0.87        Successful percutaneous coronary intervention prox LAD        PTCA and stent placement 2.75 X 12 mm Resolute Onyx drug-eluting stent        Post dilatation with 3.0X8 mm [place] balloon           Post PCI LAD dFR 0.95 RI: Prox 80% disease.  Small caliber vessel. LCx: No significant stenosis. RCA: Distal 40%, PDA bifurcation 90% stenosis in small caliber vessel.  Right heart cath: RA: 5 mmHg RV: 27/6 mmHg PA: 26/14 mmHg, mean PA 19 mmHg PCW: 15 mmHg  If residual dyspnea symptoms, will stage PDA bifurcation stenosis stenting in future.  Lexiscan Myoview Stress Test 12/26/2018: Stress EKG is non-diagnostic, as this is pharmacological stress test. Although the t.i.d. index was normal at 0.95, left ventricle appeared to be mildly dilated in stress images with LV end-diastolic volume of 453 mL. The perfusion images reveals normal perfusion, however gated images reveal suggestion of mild mid anterior and septal hypokinesis.  LVEF was at the lower limit of normal at 50%. Intermediate risk study.  Clinical correlation recommended. No previous exam available for comparison.  Echocardiogram 12/02/2018: Left ventricle cavity is normal in size. Mild concentric hypertrophy of the left ventricle. Normal LV systolic function with EF 55%. Normal global wall motion. Normal diastolic filling pattern.  The aortic root is mildly dilated at 4.1 cm. Left atrial cavity is normal in size. Aneurysmal interatrial septum without 2D or color Doppler evidence of interatrial shunt. Right ventricle cavity is mildly dilated. Normal right ventricular function. Trileaflet aortic valve. Trace aortic regurgitation. Mild (Grade I) mitral regurgitation. Mild tricuspid regurgitation. Estimated pulmonary artery systolic pressure is 23 mmHg. No significant change compared to previous study on 02/17/2018.   EKG 11/24/2018: Sinus rhythm 66 bpm. Left ventricular hypertrophy.   CTA chest 03/2018: 1. No residual or acute pulmonary embolism. 2. Mild bronchial wall thickening seen with bronchitis or reactive airway disease. No pneumonia. 3. Old granulomatous disease.  CTA chest 01/2018: Positive for bilateral pulmonary emboli. Positive for acute PE with CT  evidence of right heart strain (RV/LV Ratio = 1.3) consistent with at least submassive (intermediate risk) PE. The presence of right heart strain has been associated with an increased risk of morbidity and mortality.  LE venous duplex US 03/2018: Left: Findings consistent with age indeterminate deep vein thrombosis involving the left popliteal vein, and left peroneal vein. No cystic structure found in the popliteal fossa. Compared to previous study, femoral vein and gastroc vein thrombus has  Resolved.  LE venous duplex US 01/2018: Right: Findings consistent with acute deep vein thrombosis involving the right peroneal vein. Left: Findings consistent with acute deep vein thrombosis involving the left proximal femoral vein, left gastrocnemius vein, left popliteal vein, left posterior tibial vein, and left peroneal vein.   Echocardiogram 01/2018: Normal-sized LV.  EF 60-65%.  Normal diastolic function. Mildly dilated ascending aorta 4.2 cm. No significant valvular abnormality. Mildly dilated RV with normal systolic function.  RVSP 27 mmHg.  Nuclear stress test 09/2016:  The left ventricular ejection fraction is mildly decreased (45-54%).  Nuclear stress EF: 52%.  Blood pressure demonstrated a blunted response to exercise.  There was no ST segment deviation noted during stress.  Defect 1: There is a small defect of moderate severity present in the apex location.  This is a low risk study.   Low risk stress nuclear study with very mild apical ischemia; EF 52 (low normal to mild global reduction in LV function); mild LVE.    Recent labs: 10/10/2018: Glucose 107, BUN/Cr 14/1.12. EGFR 65. Na/K 140/4.4. Rest of the CMP normal H/H 14/42. MCV 92. Platelets 152 Chol 192, TG 131, HDL 44, LDL 123     Review of Systems  Review of Systems  Cardiovascular: Positive for dyspnea on exertion. Negative for chest pain, leg swelling, palpitations and syncope.  Respiratory: Positive for cough  (Dry) and shortness of breath.           Vitals:   04/27/19 1301  BP: 123/81  Pulse: 63  Temp: 97.9 F (36.6 C)  SpO2: 97%     Body mass index is 32.24 kg/m. Filed Weights   04/27/19 1301  Weight: 237 lb 11.2 oz (107.8 kg)     Objective:   Physical Exam  Physical Exam  Constitutional: He appears well-developed and well-nourished.  Neck: No JVD present.  Cardiovascular: Normal rate, regular rhythm, normal heart sounds and intact distal pulses.  No murmur heard. Pulmonary/Chest: Effort normal and breath sounds normal. He has no wheezes. He has no rales.  Musculoskeletal:        General: No edema.  Nursing note and vitals reviewed.      Assessment & Recommendations:   69 year old Caucasian male with hypertension, hyperlipidemia, history of bilateral lower extremity DVT and PE (01/2018), CAD, persistent dyspnea and new cough.  Exertional dyspnea, cough: While the patient has coronary artery disease  (s/p mid LAD PCI 04/2019, residual severe disease in small caliber PDA and ramus, mod residual disease in mid to distal LAD), He has had no improvement in shortness of breath with LAD PCI.  EKG today does not show any ischemic abnormalities.  Patient's wife, who is an Chief Financial Officer, asked me about "stent failure".  I do not suspect stent thrombosis, and absence of any EKG abnormalities.  In-stent restenosis is highly unlikely within 1 week from initial stent placement.  Patient also asked me about the new see her regarding recall of [employer] stents.  I reassured the patient that this recall was regarding thoracic stent graft, and not regarding the coronary stent that I just placed.   I suspect his shortness of breath and dry cough may be a alternate etiology.  Given his history of PE, I have ordered a CT angiogram of his chest.  I will also refer him to [facility] pulmonology for further evaluation.  In the meantime, I have stopped his valsartan, in case this is a rare situation of  valsartan causing cough.  If pulmonary evaluation is unremarkable, we can revisit his coronary anatomy, ensure stent patency in LAD, and address residual severe disease.  I do not think ramus intermedius is amenable to PCI.  PDA may be amenable to angioplasty, but unlikely to be amenable to stenting.    CAD: Continue aspirin, Plavix, high intensity statin, amlodipine. Not on beta-blocker due to resting bradycardia.  Colon polyp: Recent diagnosis of large colon polyp, although without bleeding. Should he have any GI bleeding, or require further work-up for his polyp, Plavix can safely be inturrupted after end of March 2020, with relatively low risk of stent thrombosis in [place] Onyx stent.  Hypertension: Controlled.   I  will see him back in 4 weeks.  Time spent: 60 min  [place], MD [facility] Pager: [phone] Office: [phone] If no answer Cell [phone]

## 2019-04-27 NOTE — Telephone Encounter (Signed)
Pt insurance is active and benefits verified through [facility] [employer]-pay $25, DED $500/0 met, out of pocket $2,500/$74.08 met, co-insurance 0%. no pre-authorization required. Passport, 04/27/2019@8 :24am, REF# [id number]  Will contact patient to see if he is interested in the Cardiac Rehab Program. If interested, patient will need to complete follow up appt. Once completed, patient will be contacted for scheduling upon review by the RN Navigator.

## 2019-04-28 ENCOUNTER — Ambulatory Visit
Admission: RE | Admit: 2019-04-28 | Discharge: 2019-04-28 | Disposition: A | Discharge status: Home / Self Care | Payer: 59 | Source: Ambulatory Visit | Attending: Cardiology | Admitting: Cardiology

## 2019-04-28 DIAGNOSIS — R0602 Shortness of breath: Secondary | ICD-10-CM

## 2019-04-28 MED ORDER — IOPAMIDOL (ISOVUE-370) INJECTION 76%
75.0000 mL | Freq: Once | INTRAVENOUS | Status: AC | PRN
  Administered 2019-04-28: 75 mL via INTRAVENOUS

## 2019-04-28 NOTE — Progress Notes (Signed)
Reviewed and discussed with the patient.  No PE.  Mild elevation of thoracic aorta at 4.2 cm.  Will need repeat CTA in 1 year.  Patient saw pulmonologist today.  They have made some further recommendations.  I will see him on 05/18/2019 for follow-up.

## 2019-05-01 DIAGNOSIS — R768 Other specified abnormal immunological findings in serum: Secondary | ICD-10-CM | POA: Insufficient documentation

## 2019-05-05 ENCOUNTER — Ambulatory Visit: Payer: 59 | Admitting: Family Medicine

## 2019-05-10 ENCOUNTER — Other Ambulatory Visit: Payer: Self-pay | Admitting: Family Medicine

## 2019-05-10 ENCOUNTER — Other Ambulatory Visit: Payer: Self-pay | Admitting: Cardiology

## 2019-05-10 DIAGNOSIS — I10 Essential (primary) hypertension: Secondary | ICD-10-CM

## 2019-05-10 DIAGNOSIS — R079 Chest pain, unspecified: Secondary | ICD-10-CM

## 2019-05-10 DIAGNOSIS — G609 Hereditary and idiopathic neuropathy, unspecified: Secondary | ICD-10-CM

## 2019-05-18 ENCOUNTER — Ambulatory Visit: Payer: 59 | Admitting: Cardiology

## 2019-05-18 ENCOUNTER — Other Ambulatory Visit: Payer: Self-pay

## 2019-05-18 ENCOUNTER — Encounter: Payer: Self-pay | Admitting: Cardiology

## 2019-05-18 VITALS — BP 134/76 | HR 61 | Temp 97.8°F | Ht 72.0 in | Wt 244.0 lb

## 2019-05-18 DIAGNOSIS — R600 Localized edema: Secondary | ICD-10-CM

## 2019-05-18 DIAGNOSIS — I10 Essential (primary) hypertension: Secondary | ICD-10-CM

## 2019-05-18 DIAGNOSIS — I25118 Atherosclerotic heart disease of native coronary artery with other forms of angina pectoris: Secondary | ICD-10-CM

## 2019-05-18 DIAGNOSIS — Z86711 Personal history of pulmonary embolism: Secondary | ICD-10-CM

## 2019-05-18 MED ORDER — FUROSEMIDE 20 MG PO TABS: 20.0000 mg | ORAL_TABLET | Freq: Every day | ORAL | 3 refills | Status: DC

## 2019-05-18 NOTE — Progress Notes (Signed)
Patient referred by Willie, [name], MD for exertional chest pain and dyspnea  Subjective:   Willie Dominguez, male    DOB: [birth year], 69 y.o.   MRN: [id number]   Chief Complaint  Patient presents with  . Coronary Artery Disease  . Hypertension  . Follow-up    HPI  69 year old Caucasian male with hypertension, hyperlipidemia, history of bilateral lower extremity DVT and PE in 01/2018, family h/o CAD,with exertional dyspnea.  Patient originally saw me in September 2020 for symptoms of shortness of breath.  His symptoms seem to have worsened since his PE in December 2019.  However, on further review, the symptoms were present even prior to that.  He never had any significant chest pain symptoms.  Patient's subsequent CT angiogram in January 2020 did not show any PE. Work-up with echocardiogram did not show any pulmonary hypertension.  Stress test was equivocal.  I had initially referred him to [facility] pulmonology, but it appears that he never heard from them.  In the meantime, given his worsening symptoms all persistent shortness of breath, angina equivalent remains in the differential.  Thus, I recommended right and left heart catheterization and pulmonary angiography.    Cath showed no evidence of pulmonary hypertension, but showed severe small PDA bifurcation stenosis, severe stenosis and small caliber ramus, and dFR positive ventilatory stenosis.  He underwent successful LAD stenting with post PCI DFR showing residual nonsignificant disease distally.  Given the amount of contrast used, I decided to stage.  Intervention in the future, as clinically necessary.  04/2019: Patient had not had any improvement in her shortness of breath symptoms.  Matter-of-fact, shortness of breath has worsened in the past 1 week.  He shortness of breath is present at both rest and exertion.  He also complains of dry cough, which is new.  He does not have any fever, chills, or other infectious symptoms.  He  has had occasional "twinge" in his chest that lasts for a few seconds, but also has a separate chest pressure that is constant 24X7.  Patient was seen by [facility] pulmonology in [facility] (04/2019) . His pulmonary function test indicated restrictive pattern. He was started on Spiriva. He was also seen by Rheumatology (05/2019) for ANA speckled pattern 1:320. Further workup is ongoing.   In last four weeks, he has had one episode of chest pressure while working in the yard. His episodes of shortness of breath, cough continue. He has noticed some leg swelling.     Current Outpatient Medications on File Prior to Visit  Medication Sig Dispense Refill  . acetaminophen (TYLENOL) 500 MG tablet Take 500-1,000 mg by mouth every 6 (six) hours as needed (for pain.).    [name] amLODipine (NORVASC) 5 MG tablet TAKE 1 TABLET BY MOUTH EVERY DAY 30 tablet 3  . aspirin EC 81 MG tablet Take 81 mg by mouth daily.    . Cholecalciferol (VITAMIN D-3) 125 MCG (5000 UT) TABS Take 5,000 Units by mouth daily.    . clopidogrel (PLAVIX) 75 MG tablet Take 1 tablet (75 mg total) by mouth daily. 30 tablet 1  . Coenzyme Q10 (COQ10) 100 MG CAPS Take 100 mg by mouth daily.    . DULoxetine (CYMBALTA) 30 MG capsule TAKE 1 CAPSULE BY MOUTH EVERY DAY 30 capsule 1  . nitroGLYCERIN (NITROSTAT) 0.4 MG SL tablet Place 1 tablet (0.4 mg total) under the tongue every 5 (five) minutes as needed for chest pain. 30 tablet 3  . rosuvastatin (CRESTOR)  20 MG tablet Take 1 tablet (20 mg total) by mouth daily. Please disregard another order for `0 mg from earlier today 90 tablet 3  . vitamin B-12 (CYANOCOBALAMIN) 1000 MCG tablet Take 1,000 mcg by mouth daily.     No current facility-administered medications on file prior to visit.    Cardiovascular studies:  CTA chest 04/28/2019: 1.  No demonstrable pulmonary embolus. 2. Prominence of the ascending thoracic aorta with a maximum transverse diameter of 4.2 x 4.2 cm. Recommend annual  imaging followup by CTA or MRA. This recommendation follows 2010 ACCF/AHA/AATS/ACR/ASA/SCA/SCAI/SIR/STS/SVM Guidelines for the Diagnosis and Management of Patients with Thoracic Aortic Disease. Circulation. 2010; 121: X211-H417. Aortic aneurysm NOS (ICD10-I71.9). No dissection. 3. Evidence of occasional calcified granulomas. No edema or airspace opacity. Areas of slight atelectatic change. 4.  No demonstrable adenopathy.  EKG 04/27/2019: Sinus rhythm 58 bpm.  LHC/RHC/coronary intervention 04/18/2019: LM: Normal LAD: Prox-mid 80% stenosis and LAD/D1 bifurcation (1,0,0). Moderate disease in prox Diag.         LAD dFR 0.87        Successful percutaneous coronary intervention prox LAD        PTCA and stent placement 2.75 X 12 mm Resolute Onyx drug-eluting stent        Post dilatation with 3.0X8 mm [place] balloon           Post PCI LAD dFR 0.95 RI: Prox 80% disease. Small caliber vessel. LCx: No significant stenosis. RCA: Distal 40%, PDA bifurcation 90% stenosis in small caliber vessel.  Right heart cath: RA: 5 mmHg RV: 27/6 mmHg PA: 26/14 mmHg, mean PA 19 mmHg PCW: 15 mmHg  If residual dyspnea symptoms, will stage PDA bifurcation stenosis stenting in future.  Lexiscan Myoview Stress Test 12/26/2018: Stress EKG is non-diagnostic, as this is pharmacological stress test. Although the t.i.d. index was normal at 0.95, left ventricle appeared to be mildly dilated in stress images with LV end-diastolic volume of 408 mL. The perfusion images reveals normal perfusion, however gated images reveal suggestion of mild mid anterior and septal hypokinesis.  LVEF was at the lower limit of normal at 50%. Intermediate risk study.  Clinical correlation recommended. No previous exam available for comparison.  Echocardiogram 12/02/2018: Left ventricle cavity is normal in size. Mild concentric hypertrophy of the left ventricle. Normal LV systolic function with EF 55%. Normal global wall motion. Normal  diastolic filling pattern.  The aortic root is mildly dilated at 4.1 cm. Left atrial cavity is normal in size. Aneurysmal interatrial septum without 2D or color Doppler evidence of interatrial shunt. Right ventricle cavity is mildly dilated. Normal right ventricular function. Trileaflet aortic valve. Trace aortic regurgitation. Mild (Grade I) mitral regurgitation. Mild tricuspid regurgitation. Estimated pulmonary artery systolic pressure is 23 mmHg. No significant change compared to previous study on 02/17/2018.   EKG 11/24/2018: Sinus rhythm 66 bpm. Left ventricular hypertrophy.   LE venous duplex US 03/2018: Left: Findings consistent with age indeterminate deep vein thrombosis involving the left popliteal vein, and left peroneal vein. No cystic structure found in the popliteal fossa. Compared to previous study, femoral vein and gastroc vein thrombus has  Resolved.  LE venous duplex US 01/2018: Right: Findings consistent with acute deep vein thrombosis involving the right peroneal vein. Left: Findings consistent with acute deep vein thrombosis involving the left proximal femoral vein, left gastrocnemius vein, left popliteal vein, left posterior tibial vein, and left peroneal vein.   Echocardiogram 01/2018: Normal-sized LV.  EF 60-65%.  Normal diastolic function. Mildly dilated ascending  aorta 4.2 cm. No significant valvular abnormality. Mildly dilated RV with normal systolic function.  RVSP 27 mmHg.  Nuclear stress test 09/2016:  The left ventricular ejection fraction is mildly decreased (45-54%).  Nuclear stress EF: 52%.  Blood pressure demonstrated a blunted response to exercise.  There was no ST segment deviation noted during stress.  Defect 1: There is a small defect of moderate severity present in the apex location.  This is a low risk study.   Low risk stress nuclear study with very mild apical ischemia; EF 52 (low normal to mild global reduction in LV function); mild  LVE.    Recent labs: 10/10/2018: Glucose 107, BUN/Cr 14/1.12. EGFR 65. Na/K 140/4.4. Rest of the CMP normal H/H 14/42. MCV 92. Platelets 152 Chol 192, TG 131, HDL 44, LDL 123     Review of Systems  Review of Systems  Cardiovascular: Positive for dyspnea on exertion and leg swelling. Negative for chest pain, palpitations and syncope.  Respiratory: Positive for cough (Dry) and shortness of breath.           Vitals:   05/18/19 1118  BP: 134/76  Pulse: 61  Temp: 97.8 F (36.6 C)  SpO2: 97%     Body mass index is 33.09 kg/m. Filed Weights   05/18/19 1118  Weight: 244 lb (110.7 kg)     Objective:   Physical Exam  Physical Exam  Constitutional: He appears well-developed and well-nourished.  Neck: No JVD present.  Cardiovascular: Normal rate, regular rhythm, normal heart sounds and intact distal pulses.  No murmur heard. Pulmonary/Chest: Effort normal and breath sounds normal. He has no wheezes. He has no rales.  Musculoskeletal:        General: Edema (1+ Left, trace Rt) present.  Nursing note and vitals reviewed.      Assessment & Recommendations:   69 year old Caucasian male with hypertension, hyperlipidemia, history of bilateral lower extremity DVT and PE (01/2018), CAD, persistent dyspnea and new cough.  Exertional dyspnea, cough: While the patient has coronary artery disease  (s/p mid LAD PCI 04/2019, residual severe disease in small caliber PDA and ramus, mod residual disease in mid to distal LAD), He has had no improvement in shortness of breath with LAD PCI.  EKG today does not show any ischemic abnormalities.  Patient's wife, who is an Chief Financial Officer, asked me about "stent failure".  I do not suspect stent thrombosis, and absence of any EKG abnormalities.  In-stent restenosis is highly unlikely within 1 week from initial stent placement.  Patient also asked me about the new see her regarding recall of [employer] stents.  I reassured the patient that this recall  was regarding thoracic stent graft, and not regarding the coronary stent that I just placed.   I suspect his shortness of breath and dry cough may be a alternate etiology. That said, pulmonary and rheumatology workup in Feb-March 2021 was under whelming. He has been given a trial of Spiriva. Stopping valsartan has not changed his cough symptoms.   Possibility of his symptoms being related to residual severe CAD is low. However he has had at least one angina episode since last visit. Recommend using NTG as needed. Also added lasix for possibility of vascular congestion.  I will see him for a virtual visit in 3 weeks. If his symptoms persist, I will consider repeat coronary angiography, ensure stent patency in LAD, and address residual severe disease.  I do not think ramus intermedius is amenable to PCI.  PDA may be  amenable to angioplasty, but unlikely to be amenable to stenting.    My impression is that the patient is reluctant about repeat procedure at this time, but wife is encouraging him to consider this option.   CAD: Continue aspirin, Plavix, high intensity statin, amlodipine. Not on beta-blocker due to resting bradycardia.  Colon polyp: No recent bleeding. Workup on hold pending improvement in his above symptoms of shortness of breath.   Hypertension: Controlled.   F/u in 3 weeks.   Time spent: 60 min   [place], MD [facility] [place] Pager: [phone] Office: [phone] If no answer Cell [phone]

## 2019-05-19 ENCOUNTER — Ambulatory Visit: Payer: 59 | Admitting: Family Medicine

## 2019-05-19 ENCOUNTER — Encounter: Payer: Self-pay | Admitting: Family Medicine

## 2019-05-19 VITALS — BP 128/80 | HR 60 | Temp 96.6°F | Resp 16 | Ht 72.0 in | Wt 244.0 lb

## 2019-05-19 DIAGNOSIS — G609 Hereditary and idiopathic neuropathy, unspecified: Secondary | ICD-10-CM

## 2019-05-19 DIAGNOSIS — E785 Hyperlipidemia, unspecified: Secondary | ICD-10-CM | POA: Diagnosis not present

## 2019-05-19 DIAGNOSIS — R0600 Dyspnea, unspecified: Secondary | ICD-10-CM

## 2019-05-19 DIAGNOSIS — R0609 Other forms of dyspnea: Secondary | ICD-10-CM

## 2019-05-19 DIAGNOSIS — I25118 Atherosclerotic heart disease of native coronary artery with other forms of angina pectoris: Secondary | ICD-10-CM | POA: Diagnosis not present

## 2019-05-19 DIAGNOSIS — I10 Essential (primary) hypertension: Secondary | ICD-10-CM

## 2019-05-19 LAB — LIPID PANEL
Cholesterol: 106 mg/dL (ref 0–200)
HDL: 44.6 mg/dL (ref >39.00)
LDL Cholesterol: 46 mg/dL (ref 0–99)
NonHDL: 61.12
Total CHOL/HDL Ratio: 2
Triglycerides: 75 mg/dL (ref 0.0–149.0)
VLDL: 15 mg/dL (ref 0.0–40.0)

## 2019-05-19 LAB — HEPATIC FUNCTION PANEL
ALT: 20 U/L (ref 0–53)
AST: 19 U/L (ref 0–37)
Albumin: 4 g/dL (ref 3.5–5.2)
Alkaline Phosphatase: 90 U/L (ref 39–117)
Bilirubin, Direct: 0.2 mg/dL (ref 0.0–0.3)
Total Bilirubin: 0.7 mg/dL (ref 0.2–1.2)
Total Protein: 6.7 g/dL (ref 6.0–8.3)
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
## 2019-05-19 NOTE — Patient Instructions (Signed)
A few things to remember from today's visit:   No changes in meds for blood pressure or cholesterol. Continue regular physical activity. Monitor for worsening symptoms. Keep appt with lung doctor.   Please be sure medication list is accurate. If a new problem present, please set up appointment sooner than planned today.

## 2019-05-22 ENCOUNTER — Encounter (HOSPITAL_COMMUNITY): Payer: Self-pay

## 2019-05-22 NOTE — Telephone Encounter (Signed)
Attempted to call patient in regards to Cardiac Rehab - LM on VM Mailed letter 

## 2019-05-25 ENCOUNTER — Telehealth: Payer: Self-pay | Admitting: Gastroenterology

## 2019-05-25 NOTE — Telephone Encounter (Signed)
Dr. Virgina Jock,    This is a mutual patient.  I would appreciate you commenting on his plavix therapy as it relates to an eventual colonoscopy.

## 2019-05-25 NOTE — Telephone Encounter (Signed)
Willie Dominguez' name came up for recall after last colonoscopy November 2020.  At that time, I referred him back to Willie Dominguez at [facility], who had performed a complex polypectomy several years back.  There is a persistent cecal polyp requiring advanced polypectomy technique by Willie Dominguez.  Reviewing the chart, I see that Willie Dominguez has had trouble with shortness of breath, has undergone cardiac catheterization with stent placement and evaluation by pulmonary.  I reviewed the cardiologist most recent office note from last week. Given the type of coronary stent he had placed on February 16, I expect his cardiologist would not allow him to be off Plavix for a minimum of 6 months, possibly a year.  This would preclude elective colonoscopy with polypectomy.  My impression from reviewing all that is that it is not a good time for him to undergo sedation for an elective endoscopic procedure. Please ask him to let us know what his plans are regarding follow-up with Willie Dominguez at [facility]. Change his colonoscopy recall to October of this year so his condition can be reevaluated at that point and so we do not lose track of him.

## 2019-05-25 NOTE — Telephone Encounter (Signed)
I spoke to Willie Dominguez. He states that we should double check with cardiology, as he was told that there shouldn't be an issue with coming off the Plavix before his procedure. He states that he was told it would have be be a month notice per cardio. He has not had any contact with [facility] and isn't scheduled for any upcoming appointments. Please advise

## 2019-05-26 NOTE — Telephone Encounter (Signed)
Ideally, 6 months of DAPT is recommended after PCI for stable angina. However, I was aware that he has a large colon polyp. Thus, I used Resolute Onyx stent, which has data and indication to use 1 month DAPT in high risk bleeding patients. If we have to stop/interrupt plavix for colonoscopy, it is reasonable.    That said, he has residual disease that he is contemplating PCI for. I am seeing him for f/u to make final decision on this, on 4/7. How urgent is the colonoscopy? If he chooses another PCI, will need to wait for another month after that. Alternatively, you can go ahead with colonoscopy after interrupting plavix. I can do PCI, if needed, after that.   Hope that was not too confusing. Feel free to call me anytime if you have any questions.  Willie Dominguez [phone]

## 2019-05-26 NOTE — Telephone Encounter (Signed)
Patient has been notified and aware. Recall has been updated to 11-2019. Informed Willie Dominguez we will let him know what Dr Donneta Romberg decision is after reviewing his case.

## 2019-05-26 NOTE — Telephone Encounter (Signed)
Dr. Virgina Jock, Thanks very much for your help.  The colon polyp is not very large, and it is known to be benign.  Therefore,colonoscopy can wait 6 months out from the stent placement.  We will put him on a recall for September 2020, so his name will actually come up about a month prior to that, chart review will be performed and we can make a decision at that time. ______________________________ Vivien Rota, please inform the patient

## 2019-05-26 NOTE — Telephone Encounter (Signed)
That's very helpful.  I will also wait to hear from Dr. Virgina Jock on this, and will then set recall.  Have copied this to our advanced endoscopist Dr Rush Landmark (and will also discuss with him) to see if he thinks it may be something he can handle here rather than send patient back to [facility].

## 2019-05-26 NOTE — Telephone Encounter (Signed)
He underwent PCI on 04/18/19, so it will be safer to wait at least 6 months before stopping Plavix and aspirin. So if possible, I recommend postponing colonoscopy until 11/2019. Have a good day! Khalil Szczepanik [name]

## 2019-05-27 NOTE — Telephone Encounter (Signed)
HD and Vivien Rota, If patient would like to stay local, I am happy to be available for him. If you want to set him up for a clinic visit with me so that we set up his recall Colonoscopy at the time you all feel is appropriate, I am happy. Thanks. GM

## 2019-06-06 NOTE — Progress Notes (Signed)
Patient referred by Willie, [name], MD for exertional chest pain and dyspnea  Subjective:   Willie Dominguez, male    DOB: 1950/06/22, 69 y.o.   MRN: [id number]  I connected with the patient on 06/07/2019 by a telephone call and verified that I am speaking with the correct person using two identifiers.     I offered the patient a video enabled application for a virtual visit. Unfortunately, this could not be accomplished due to technical difficulties/lack of video enabled phone/computer. I discussed the limitations of evaluation and management by telemedicine and the availability of in person appointments. The patient expressed understanding and agreed to proceed.   This visit type was conducted due to national recommendations for restrictions regarding the COVID-19 Pandemic (e.Dominguez. social distancing).  This format is felt to be most appropriate for this patient at this time.  All issues noted in this document were discussed and addressed.  No physical exam was performed (except for noted visual exam findings with Tele health visits).  The patient has consented to conduct a Tele health visit and understands insurance will be billed.   Chief Complaint  Patient presents with  . Coronary Artery Disease  . Follow-up    3 week  . Results    lipids    HPI  69 year old Caucasian male with hypertension, hyperlipidemia, history of bilateral lower extremity DVT and PE in 01/2018, family h/o CAD,with exertional dyspnea.  Patient originally saw me in September 2020 for symptoms of shortness of breath.  His symptoms seem to have worsened since his PE in December 2019.  However, on further review, the symptoms were present even prior to that.  He never had any significant chest pain symptoms.  Patient's subsequent CT angiogram in January 2020 did not show any PE. Work-up with echocardiogram did not show any pulmonary hypertension.  Stress test was equivocal.  I had initially referred him to [facility]  pulmonology, but it appears that he never heard from them.  In the meantime, given his worsening symptoms all persistent shortness of breath, angina equivalent remains in the differential.  Thus, I recommended right and left heart catheterization and pulmonary angiography.    Cath showed no evidence of pulmonary hypertension, but showed severe small PDA bifurcation stenosis, severe stenosis and small caliber ramus, and dFR positive LAD stenosis.  He underwent successful LAD stenting with post PCI DFR showing residual nonsignificant disease distally.  Given the amount of contrast used, I decided to stage.  Intervention in the future, as clinically necessary.  04/2019: Patient had not had any improvement in her shortness of breath symptoms.  Matter-of-fact, shortness of breath has worsened in the past 1 week.  He shortness of breath is present at both rest and exertion.  He also complains of dry cough, which is new.  He does not have any fever, chills, or other infectious symptoms.  He has had occasional "twinge" in his chest that lasts for a few seconds, but also has a separate chest pressure that is constant 24X7.  Patient was seen by [facility] pulmonology in [facility] (04/2019) . His pulmonary function test indicated restrictive pattern. He was started on Spiriva. He was also seen by Rheumatology (05/2019) for ANA speckled pattern 1:320. Further workup is ongoing.   05/2019: In last four weeks, he has had one episode of chest pressure while working in the yard. His episodes of shortness of breath, cough continue. He has noticed some leg swelling.   06/2019: Since starting  Spiriva, his shortness of breath and cough have had slight improvement. Shortness of breath is more prominent on walking up the hill or walking upstairs. He has not had any recurrence of chest pressure sometimes. His blood pressure is running higher than usual. He has been off Valsartan as were trying to elucidate etiology of his cough.    Today, he tells me that his dentist Dr. Glorious Peach has recommend third molar extraction, which has been delayed for a long time. He also needs colon polyp removal. His gastroenterologist Dr. Mallie Mussel had indicated to me that this can wait till September 2021, if he has ongoing cardiac issues.    Current Outpatient Medications on File Prior to Visit  Medication Sig Dispense Refill  . acetaminophen (TYLENOL) 500 MG tablet Take 500-1,000 mg by mouth every 6 (six) hours as needed (for pain.).    [name] amLODipine (NORVASC) 5 MG tablet TAKE 1 TABLET BY MOUTH EVERY DAY 30 tablet 3  . aspirin EC 81 MG tablet Take 81 mg by mouth daily.    . Cholecalciferol (VITAMIN D-3) 125 MCG (5000 UT) TABS Take 5,000 Units by mouth daily.    . clopidogrel (PLAVIX) 75 MG tablet Take 1 tablet (75 mg total) by mouth daily. 30 tablet 1  . Coenzyme Q10 (COQ10) 100 MG CAPS Take 100 mg by mouth daily.    [name] FLOVENT HFA 110 MCG/ACT inhaler 1 puff 2 (two) times daily.    . furosemide (LASIX) 20 MG tablet Take 1 tablet (20 mg total) by mouth daily. 30 tablet 3  . nitroGLYCERIN (NITROSTAT) 0.4 MG SL tablet Place 1 tablet (0.4 mg total) under the tongue every 5 (five) minutes as needed for chest pain. 30 tablet 3  . rosuvastatin (CRESTOR) 20 MG tablet Take 1 tablet (20 mg total) by mouth daily. Please disregard another order for `0 mg from earlier today 90 tablet 3  . vitamin B-12 (CYANOCOBALAMIN) 1000 MCG tablet Take 1,000 mcg by mouth daily.     No current facility-administered medications on file prior to visit.    Cardiovascular studies:  CTA chest 04/28/2019: 1.  No demonstrable pulmonary embolus. 2. Prominence of the ascending thoracic aorta with a maximum transverse diameter of 4.2 x 4.2 cm. Recommend annual imaging followup by CTA or MRA. This recommendation follows 2010 ACCF/AHA/AATS/ACR/ASA/SCA/SCAI/SIR/STS/SVM Guidelines for the Diagnosis and Management of Patients with Thoracic Aortic Disease. Circulation. 2010;  121: D289-T915. Aortic aneurysm NOS (ICD10-I71.9). No dissection. 3. Evidence of occasional calcified granulomas. No edema or airspace opacity. Areas of slight atelectatic change. 4.  No demonstrable adenopathy.  EKG 04/27/2019: Sinus rhythm 58 bpm.  LHC/RHC/coronary intervention 04/18/2019: LM: Normal LAD: Prox-mid 80% stenosis and LAD/D1 bifurcation (1,0,0). Moderate disease in prox Diag.         LAD dFR 0.87        Successful percutaneous coronary intervention prox LAD        PTCA and stent placement 2.75 X 12 mm Resolute Onyx drug-eluting stent        Post dilatation with 3.0X8 mm Putnam balloon           Post PCI LAD dFR 0.95 RI: Prox 80% disease. Small caliber vessel. LCx: No significant stenosis. RCA: Distal 40%, PDA bifurcation 90% stenosis in small caliber vessel.  Right heart cath: RA: 5 mmHg RV: 27/6 mmHg PA: 26/14 mmHg, mean PA 19 mmHg PCW: 15 mmHg  If residual dyspnea symptoms, will stage PDA bifurcation stenosis stenting in future.  Alexander Stress Test 12/26/2018:  Stress EKG is non-diagnostic, as this is pharmacological stress test. Although the t.i.d. index was normal at 0.95, left ventricle appeared to be mildly dilated in stress images with LV end-diastolic volume of 546 mL. The perfusion images reveals normal perfusion, however gated images reveal suggestion of mild mid anterior and septal hypokinesis.  LVEF was at the lower limit of normal at 50%. Intermediate risk study.  Clinical correlation recommended. No previous exam available for comparison.  Echocardiogram 12/02/2018: Left ventricle cavity is normal in size. Mild concentric hypertrophy of the left ventricle. Normal LV systolic function with EF 55%. Normal global wall motion. Normal diastolic filling pattern.  The aortic root is mildly dilated at 4.1 cm. Left atrial cavity is normal in size. Aneurysmal interatrial septum without 2D or color Doppler evidence of interatrial shunt. Right ventricle  cavity is mildly dilated. Normal right ventricular function. Trileaflet aortic valve. Trace aortic regurgitation. Mild (Grade I) mitral regurgitation. Mild tricuspid regurgitation. Estimated pulmonary artery systolic pressure is 23 mmHg. No significant change compared to previous study on 02/17/2018.   EKG 11/24/2018: Sinus rhythm 66 bpm. Left ventricular hypertrophy.   LE venous duplex US 03/2018: Left: Findings consistent with age indeterminate deep vein thrombosis involving the left popliteal vein, and left peroneal vein. No cystic structure found in the popliteal fossa. Compared to previous study, femoral vein and gastroc vein thrombus has  Resolved.  LE venous duplex US 01/2018: Right: Findings consistent with acute deep vein thrombosis involving the right peroneal vein. Left: Findings consistent with acute deep vein thrombosis involving the left proximal femoral vein, left gastrocnemius vein, left popliteal vein, left posterior tibial vein, and left peroneal vein.   Echocardiogram 01/2018: Normal-sized LV.  EF 60-65%.  Normal diastolic function. Mildly dilated ascending aorta 4.2 cm. No significant valvular abnormality. Mildly dilated RV with normal systolic function.  RVSP 27 mmHg.  Nuclear stress test 09/2016:  The left ventricular ejection fraction is mildly decreased (45-54%).  Nuclear stress EF: 52%.  Blood pressure demonstrated a blunted response to exercise.  There was no ST segment deviation noted during stress.  Defect 1: There is a small defect of moderate severity present in the apex location.  This is a low risk study.   Low risk stress nuclear study with very mild apical ischemia; EF 52 (low normal to mild global reduction in LV function); mild LVE.    Recent labs: 10/10/2018: Glucose 107, BUN/Cr 14/1.12. EGFR 65. Na/K 140/4.4. Rest of the CMP normal H/H 14/42. MCV 92. Platelets 152 Chol 192, TG 131, HDL 44, LDL 123     Review of Systems  Review of  Systems  Cardiovascular: Positive for dyspnea on exertion and leg swelling. Negative for chest pain, palpitations and syncope.  Respiratory: Positive for cough (Dry) and shortness of breath.           Vitals:   06/07/19 0810 06/07/19 1319  BP: (!) 156/73 (!) 141/84  Pulse: (!) 59 (!) 58     Body mass index is 33.09 kg/m. Filed Weights   06/07/19 0810  Weight: 244 lb (110.7 kg)     Objective:   Physical Exam  Not performed. Telephone visit.     Assessment & Recommendations:   69 year old Caucasian male with hypertension, hyperlipidemia, history of bilateral lower extremity DVT and PE (01/2018), CAD, persistent dyspnea and new cough.  Exertional dyspnea, cough: While the patient has coronary artery disease  (s/p mid LAD PCI 04/2019, residual severe disease in small caliber PDA and ramus, mod residual  disease in mid to distal LAD), He has had no improvement in shortness of breath with LAD PCI.  EKG today does not show any ischemic abnormalities.  Patient's wife, who is an Chief Financial Officer, asked me about "stent failure".  I do not suspect stent thrombosis, and absence of any EKG abnormalities.  In-stent restenosis is highly unlikely within 1 week from initial stent placement.  Patient also asked me about the new see her regarding recall of [employer] stents.  I reassured the patient that this recall was regarding thoracic stent graft, and not regarding the coronary stent that I just placed.   I suspect his shortness of breath and dry cough may have a alternate etiology. That said, pulmonary and rheumatology workup in Feb-March 2021 was under whelming. He has been given a trial of Spiriva. This has showed mild improvement. After discussion with the patient and his wife, we decided on continuing management of his shortness of breath as per pulmonology recommendations. I will also re-refer him to cardiac rehab.   Continue aspirin, Plavix, high intensity statin, amlodipine. Not on beta-blocker  due to resting bradycardia.  Hypertension: Resume valsartan 80 mg daily. Check BMP in 1 week.  Cardiac risk stratification: This is pertaining to his upcoming third molar extraction surgery and colon polyp removal.  While 6 months DAPT is recommended post coronary intervention for stable angina, he underwent stenting with Resolute Onyx stent. This stent has favorable safety data for 1 month DAPT. Plavix can be discontinued 5 days before procedure, and should resumed soon thereafter. Both dental extraction and colon polyp extraction should be low risk procedures from cardiac standpoint.   F/u in 3 months.    Nigel Mormon, MD [facility] Pager: [phone] Office: [phone] If no answer Cell [phone]

## 2019-06-07 ENCOUNTER — Telehealth (HOSPITAL_COMMUNITY): Payer: Self-pay

## 2019-06-07 ENCOUNTER — Encounter: Payer: Self-pay | Admitting: Cardiology

## 2019-06-07 ENCOUNTER — Telehealth: Payer: 59 | Admitting: Cardiology

## 2019-06-07 ENCOUNTER — Other Ambulatory Visit: Payer: Self-pay

## 2019-06-07 VITALS — BP 141/84 | HR 58 | Ht 72.0 in | Wt 244.0 lb

## 2019-06-07 DIAGNOSIS — I10 Essential (primary) hypertension: Secondary | ICD-10-CM

## 2019-06-07 NOTE — Telephone Encounter (Signed)
Multiple attempts to reach pt regarding cardiac rehab, with no success. Closed referral.

## 2019-06-08 ENCOUNTER — Telehealth (HOSPITAL_COMMUNITY): Payer: Self-pay | Admitting: *Deleted

## 2019-06-08 ENCOUNTER — Telehealth (HOSPITAL_COMMUNITY): Payer: Self-pay

## 2019-06-08 NOTE — Telephone Encounter (Signed)
Called patient to see if he was interested in participating in the Cardiac Rehab Program. Patient stated yes. Patient will come in for orientation on 06/27/2019@9 :30am and will attend the 3:15pm exercise class.  Mailed homework package.

## 2019-06-08 NOTE — Telephone Encounter (Signed)
-----   Message from [facility], MD sent at 06/07/2019  8:56 PM EDT ----- Regarding: RE: ? Ready for Cardiac Rehab I believe we can resume cardiac rehab at this time. He still has residual CAD, but is unlikely to require intervention. I think his shortness of breath is largely due to pulmonary etiology.  Thanks MJP  ----- Message ----- From: Rowe Pavy, RN Sent: 05/19/2019  10:53 AM EDT To: Nigel Mormon, MD Subject: ? Ready for Cardiac Rehab                      Dr. Virgina Jock,  Checking back with you for your advice regarding the readiness of the above pt for cardiac rehab.  Based upon your assessment on yesterday, is he ready for scheduling or waiting an additional 3 weeks for the tele visit?  As a reminder with the waiting list for cardiac rehab - Pt come in for initial assessment and use the app as a bridge until a spot becomes available onsite.  Right now running 2-4 weeks bridge time.  Pt would exercise on their own and log this information onto an app.  Rehab staff will contact pt once a week for check in.  Let me know your thoughts  Thanks so much  Paz Winsett Armed forces operational officer, BSN Cardiac and Pulmonary Rehab Nurse Navigator

## 2019-06-08 NOTE — Telephone Encounter (Signed)
Patient is notified and aware. He's glad he can stay here local vs having to travel. Explained he's on our recall list for Oct 2021.

## 2019-06-22 ENCOUNTER — Telehealth (HOSPITAL_COMMUNITY): Payer: Self-pay | Admitting: Pharmacist

## 2019-06-24 ENCOUNTER — Other Ambulatory Visit: Payer: Self-pay | Admitting: Family Medicine

## 2019-06-24 DIAGNOSIS — G609 Hereditary and idiopathic neuropathy, unspecified: Secondary | ICD-10-CM

## 2019-06-24 NOTE — Telephone Encounter (Signed)
Cardiac Rehab - Pharmacy Resident Documentation   Patient unable to be reached after three call attempts. Please complete allergy verification and medication review during patient's cardiac rehab appointment.     

## 2019-06-25 IMAGING — CT CT ANGIO CHEST
3 of 7 series · 18 of 36 positions shown · IV contrast (iopamidol)
Comparison: CTA chest February 16, 2018 and chest radiograph
February 28, 2018.

CLINICAL DATA: Shortness of breath for 1 week. LEFT lower extremity
edema. Recent pulmonary embolism, on anticoagulation.

EXAM:
CT ANGIOGRAPHY CHEST WITH CONTRAST
TECHNIQUE: Multidetector CT imaging of the chest was performed using the
standard protocol during bolus administration of intravenous
contrast. Multiplanar CT image reconstructions and MIPs were
obtained to evaluate the vascular anatomy.
CONTRAST:  100 cc IW6VQ3-9HP IOPAMIDOL ([id number]) INJECTION 76%

[Series 6: pe lung · axial · 0.91mm/px · z∈[+72,+130]mm · 2 of 115 slices shown]
[im 29/115  mediastinal]
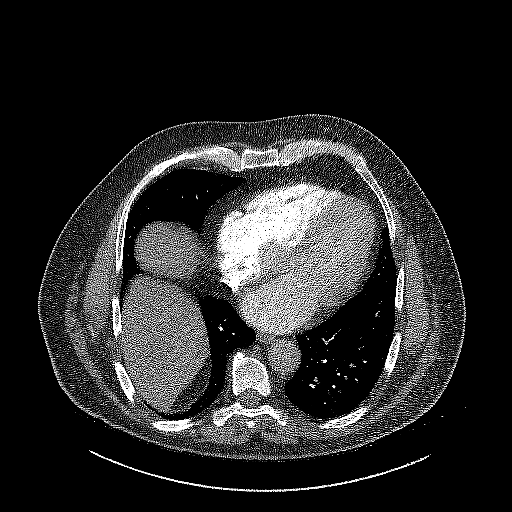
[im 58/115  mediastinal]
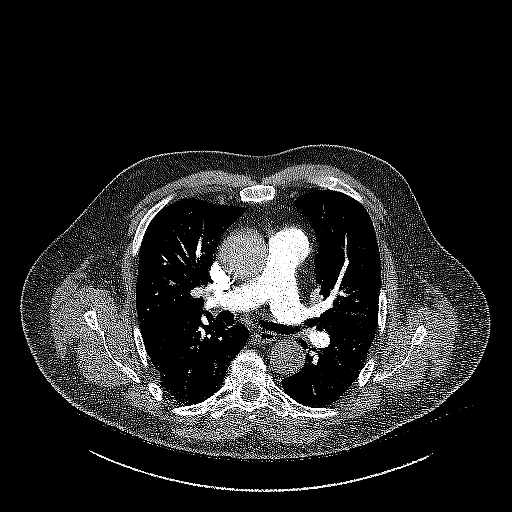

[Series 7: pe thins · axial · 0.91mm/px · z∈[-29,+226]mm · 15 of 418 slices shown]
[im 27/418  lung]
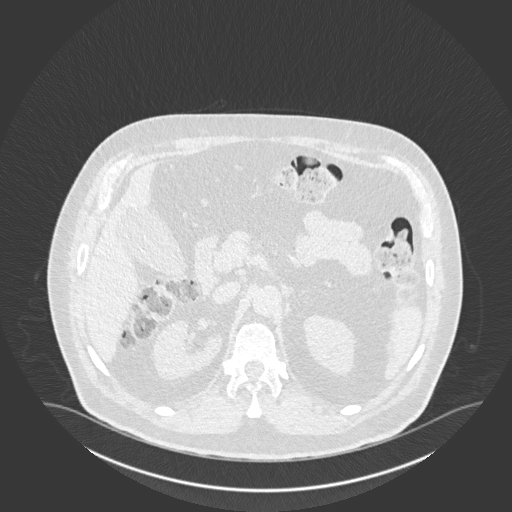
[im 53/418  mediastinal]
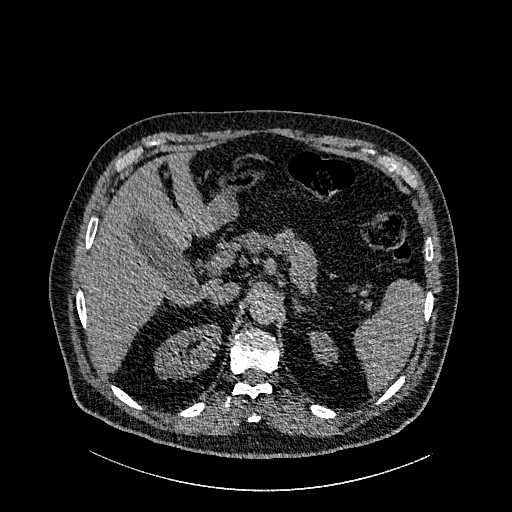
[im 79/418  lung]
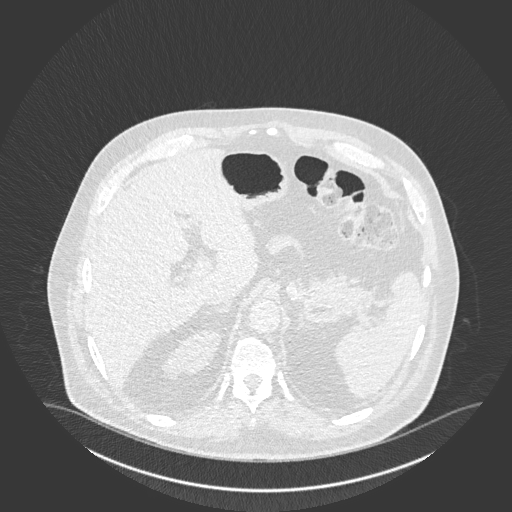
[im 105/418  mediastinal]
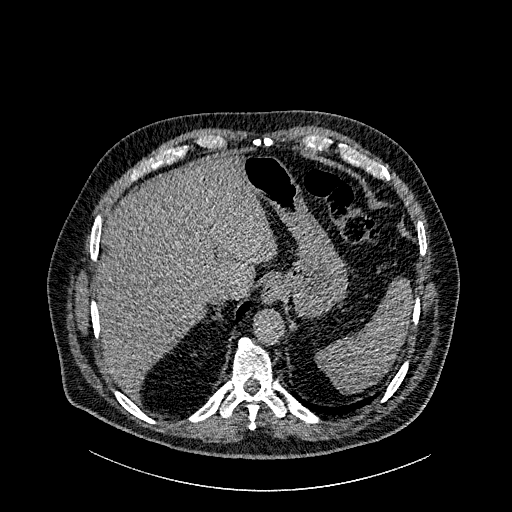
[im 131/418  lung]
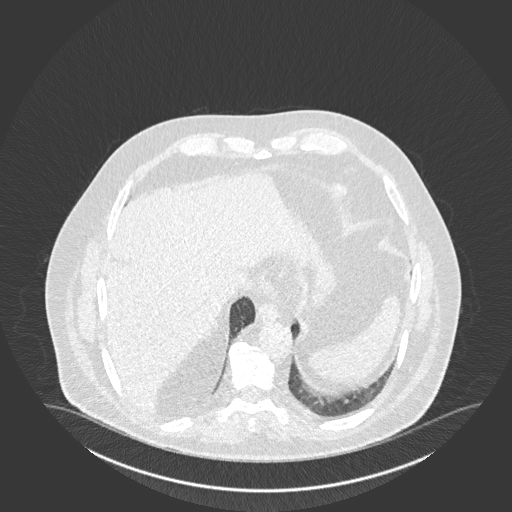
[im 157/418  mediastinal]
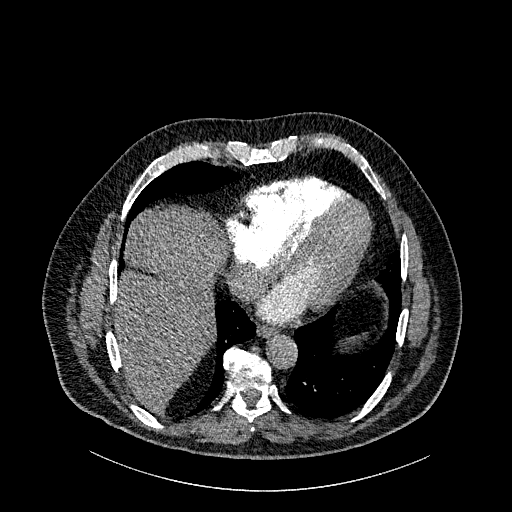
[im 183/418  lung]
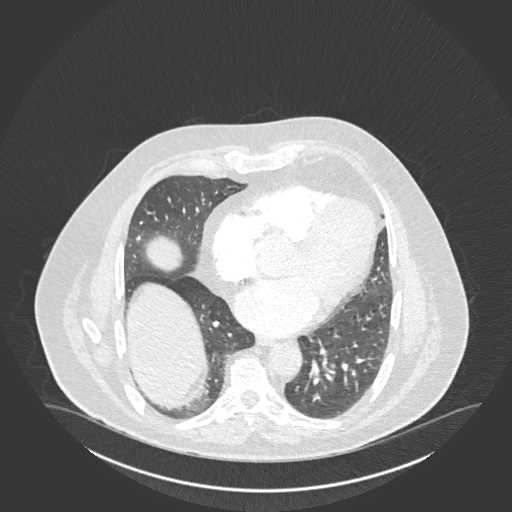
[im 209/418  mediastinal]
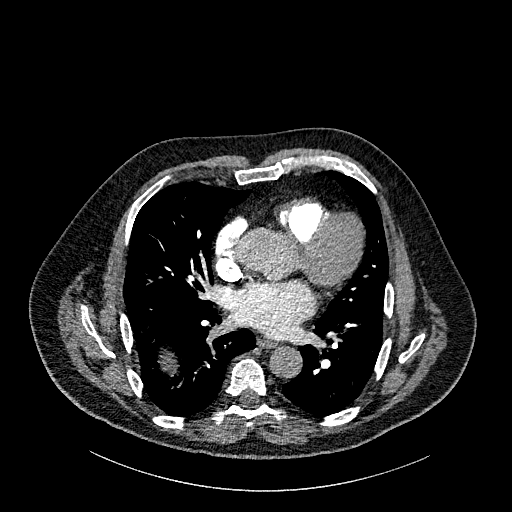
[im 235/418  lung]
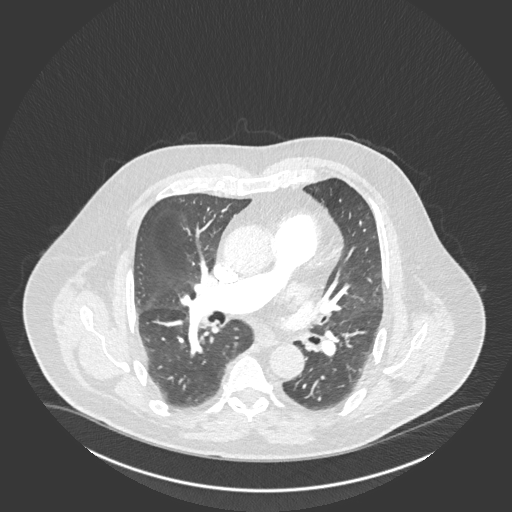
[im 261/418  mediastinal]
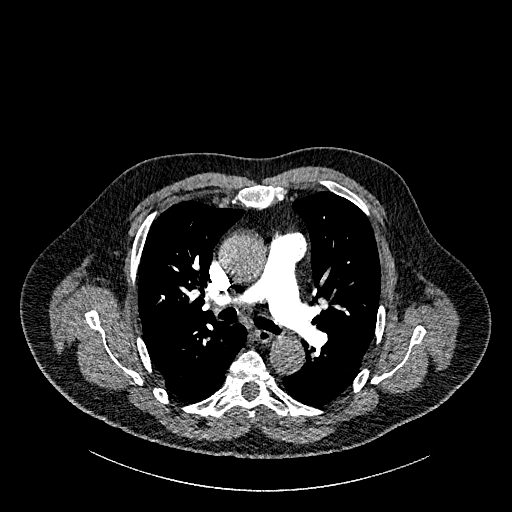
[im 287/418  lung]
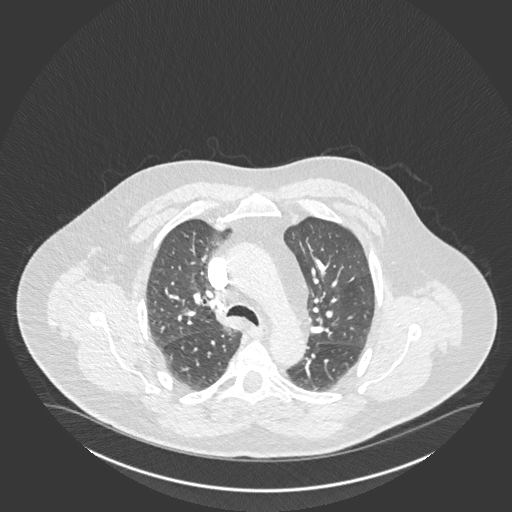
[im 313/418  mediastinal]
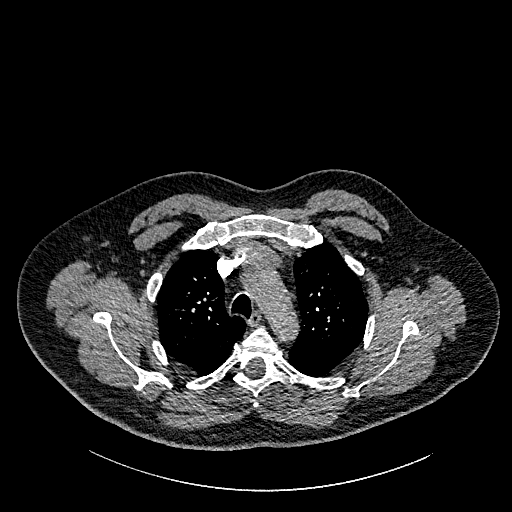
[im 339/418  lung]
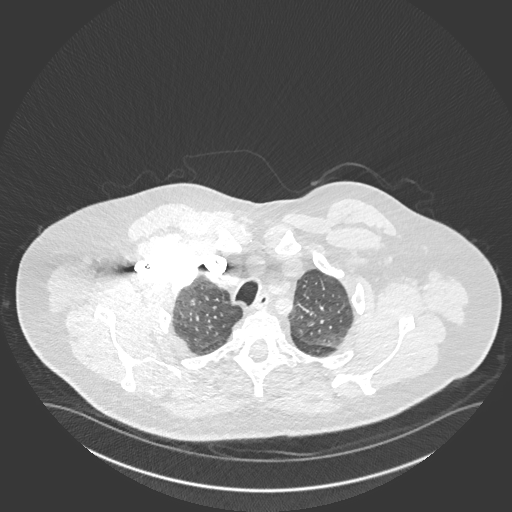
[im 365/418  mediastinal]
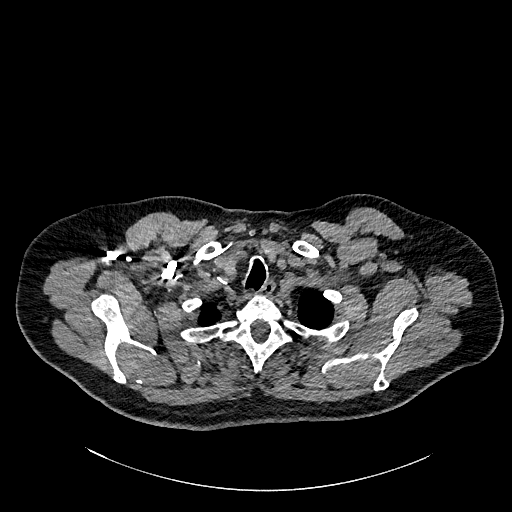
[im 391/418  lung]
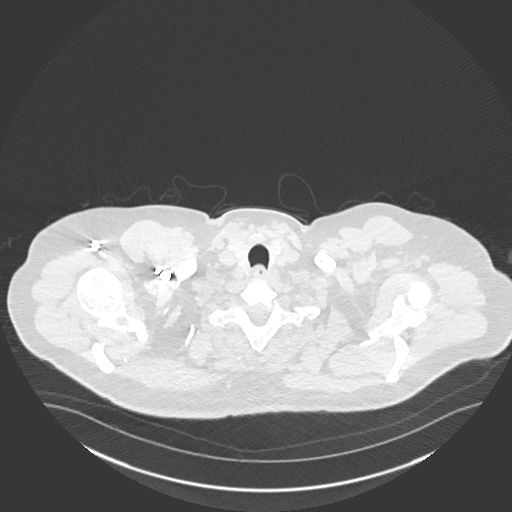

[Series 8: pe 2mm cor · coronal · 0.56mm/px · 1 of 151 slices shown]
[im 76/151  mediastinal]
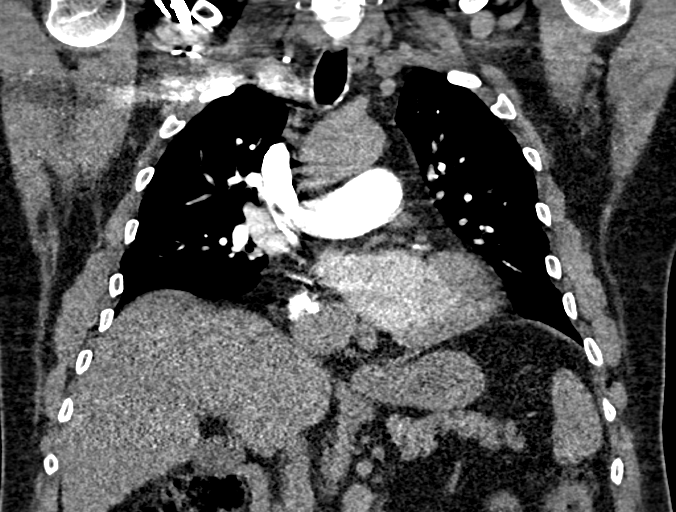

[18 of 36 positions shown; findings below may reference images not displayed]

FINDINGS: CARDIOVASCULAR: Adequate contrast opacification of the pulmonary
artery's. Main pulmonary artery is not enlarged. No pulmonary
arterial filling defects to the level of the subsegmental branches;
complete resolution of prior emboli. Heart size of mildly enlarged.
Mild coronary artery calcifications. No pericardial effusion. No
pericardial effusion. Thoracic aorta is normal course and caliber,
unremarkable.

MEDIASTINUM/NODES: No lymphadenopathy by CT size criteria. Calcified
mediastinal lymph nodes.

LUNGS/PLEURA: Tracheobronchial tree is patent, no pneumothorax. Mild
bronchial wall thickening. Low inspiratory examination. No pleural
effusion or focal consolidation. Calcified granulomas.

UPPER ABDOMEN: Non-acute. 2 mm RIGHT nephrolithiasis. Mildly
elevated RIGHT hemidiaphragm.

MUSCULOSKELETAL: Non-acute.

Review of the MIP images confirms the above findings.
IMPRESSION: 1. No residual or acute pulmonary embolism.
2. Mild bronchial wall thickening seen with bronchitis or reactive
airway disease. No pneumonia.
3. Old granulomatous disease.

## 2019-06-26 ENCOUNTER — Telehealth (HOSPITAL_COMMUNITY): Payer: Self-pay

## 2019-06-26 NOTE — Telephone Encounter (Signed)
Cardiac Rehab Note:  Unsuccessful telephone encounter to Mr. Melbourne Whiteaker to confirm his Cardiac Rehab Orientation appointment for 06/27/19 at 9:30 am. HIPAA compliant VM message left requesting call back to [phone].  Myran Arcia E. Rollene Rotunda RN, BSN [place]. [facility]  Cardiac and Pulmonary Rehabilitation Phone: [phone] Fax: [phone]

## 2019-06-27 ENCOUNTER — Encounter (HOSPITAL_COMMUNITY)
Admission: RE | Admit: 2019-06-27 | Discharge: 2019-06-27 | Disposition: A | Discharge status: Home / Self Care | Payer: 59 | Source: Ambulatory Visit | Attending: Cardiology | Admitting: Cardiology

## 2019-06-27 ENCOUNTER — Other Ambulatory Visit: Payer: Self-pay
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
## 2019-06-27 NOTE — Progress Notes (Signed)
Cardiac Rehab Note:  Mr. Willie Dominguez presented to his Cardiac Rehab Orientation appointment today as scheduled complaining of intermittent "vertigo". States "my drive from [place] was difficult because I was dizzy". Upon review of EMR, patient does not have a documented diagnosis of vertigo. Patient states he has had low BP in the past, more pronounced over the last several weeks.   Manual BP left arm sitting 84/52, automatic cuff 92/49. Standing 100/61. Gatorade given. Recovery BP standing just prior to 6 min walk 123/69 R arm. Patient denied c/o dizziness or "vertigo". After completion of 6 min walk, automatic BP R arm 106/63, 2 min post test sitting 97/62.   Report called to Magda Paganini, CMA at [facility] 6 min walktest with BP and HR faxed. Magda Paganini spoke to provider and verbal order given to D/C amlodipine. Patient notified and verbalized understanding. He is instructed to continue to monitor his BP at home and record. To notify cardiologist if symptoms continue. Patient also encouraged to remain hydrated with water as he states he does not drink a lot of fluid during the day.   Patient will begin exercising in the CR program 07/10/19 at 3:15.  Navayah Sok E. Rollene Rotunda RN, BSN [place]. [facility]  Cardiac and Pulmonary Rehabilitation Phone: [phone] Fax: [phone]

## 2019-07-01 ENCOUNTER — Other Ambulatory Visit: Payer: Self-pay | Admitting: Cardiology

## 2019-07-06 ENCOUNTER — Encounter (HOSPITAL_COMMUNITY): Payer: Self-pay | Admitting: *Deleted

## 2019-07-06 DIAGNOSIS — Z955 Presence of coronary angioplasty implant and graft: Secondary | ICD-10-CM
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
## 2019-07-10 ENCOUNTER — Encounter (HOSPITAL_COMMUNITY)
Admission: RE | Admit: 2019-07-10 | Discharge: 2019-07-10 | Disposition: A | Discharge status: Home / Self Care | Payer: 59 | Source: Ambulatory Visit | Attending: Cardiology | Admitting: Cardiology

## 2019-07-10 ENCOUNTER — Other Ambulatory Visit: Payer: Self-pay

## 2019-07-10 DIAGNOSIS — Z95820 Peripheral vascular angioplasty status with implants and grafts: Secondary | ICD-10-CM | POA: Diagnosis present

## 2019-07-10 DIAGNOSIS — Z955 Presence of coronary angioplasty implant and graft: Secondary | ICD-10-CM

## 2019-07-10 NOTE — Progress Notes (Signed)
Daily Session Note  Patient Details  Name: Willie Dominguez MRN: [id number] Date of Birth: [birth year] Referring Provider:     CARDIAC REHAB PHASE II ORIENTATION from 06/27/2019 in [place]  Referring Provider  Dr. Virgina Jock      Encounter Date: 07/10/2019  Check In: Session Check In - 07/10/19 1521      Check-In   Supervising physician immediately available to respond to emergencies  [facility] immediately available    Physician(s)  Dr. Posey Pronto    Location  [facility]    Staff Present  Georgeann Oppenheim, RN, Deland Pretty, MS, ACSM CEP, Exercise Physiologist;Chistine Dematteo Venetia Maxon, RN, Mosie Epstein, MS,ACSM CEP, Exercise Physiologist    Virtual Visit  No    Medication changes reported      No    Fall or balance concerns reported     No    Tobacco Cessation  No Change    Warm-up and Cool-down  Performed on first and last piece of equipment    Resistance Training Performed  Yes    VAD Patient?  No    PAD/SET Patient?  No      Pain Assessment   Currently in Pain?  No/denies    Multiple Pain Sites  No       Capillary Blood Glucose: No results found for this or any previous visit (from the past 24 hour(s)).  Exercise Prescription Changes - 07/10/19 1600      Response to Exercise   Blood Pressure (Admit)  113/68    Blood Pressure (Exercise)  110/72    Blood Pressure (Exit)  108/72    Heart Rate (Admit)  65 bpm    Heart Rate (Exercise)  68 bpm    Heart Rate (Exit)  65 bpm    Rating of Perceived Exertion (Exercise)  12    Symptoms  None   No complaints with todays session, tolerated well.    Comments  Tolerated session well, no compalints    Duration  Progress to 30 minutes of  aerobic without signs/symptoms of physical distress    Intensity  THRR unchanged      Progression   Progression  Continue to progress workloads to maintain intensity without signs/symptoms of physical distress.    Average METs  1.7       Resistance Training   Training Prescription  Yes    Weight  3    Reps  10-15      NuStep   Level  2    Minutes  15    METs  1.7      Arm Ergometer   Level  1    Minutes  15       Social History   Tobacco Use  Smoking Status Never Smoker  Smokeless Tobacco Never Used    Goals Met:  No report of cardiac concerns or symptoms  Goals Unmet:  Not Applicable  Comments: Willie Dominguez started cardiac rehab today.  Pt tolerated light exercise without difficulty. VSS, telemetry-Sinus Rhythm, asymptomatic.  Medication list reconciled. Pt denies barriers to medicaiton compliance.  PSYCHOSOCIAL ASSESSMENT:  PHQ-0. Pt exhibits positive coping skills, hopeful outlook with supportive family. No psychosocial needs identified at this time, no psychosocial interventions necessary.    Pt enjoys music hiking and woodworking.   Pt oriented to exercise equipment and routine.    Understanding verbalized.Barnet Pall, RN,BSN 07/10/2019 4:41 PM   Dr. Fransico Him is Medical Director for Cardiac Rehab at [facility].

## 2019-07-12 ENCOUNTER — Encounter (HOSPITAL_COMMUNITY)
Admission: RE | Admit: 2019-07-12 | Discharge: 2019-07-12 | Disposition: A | Discharge status: Home / Self Care | Payer: 59 | Source: Ambulatory Visit | Attending: Cardiology | Admitting: Cardiology

## 2019-07-12 ENCOUNTER — Other Ambulatory Visit: Payer: Self-pay

## 2019-07-12 DIAGNOSIS — Z95820 Peripheral vascular angioplasty status with implants and grafts: Secondary | ICD-10-CM | POA: Diagnosis not present

## 2019-07-12 DIAGNOSIS — Z955 Presence of coronary angioplasty implant and graft: Secondary | ICD-10-CM

## 2019-07-14 ENCOUNTER — Other Ambulatory Visit: Payer: Self-pay

## 2019-07-14 ENCOUNTER — Encounter (HOSPITAL_COMMUNITY)
Admission: RE | Admit: 2019-07-14 | Discharge: 2019-07-14 | Disposition: A | Discharge status: Home / Self Care | Payer: 59 | Source: Ambulatory Visit | Attending: Cardiology | Admitting: Cardiology

## 2019-07-14 DIAGNOSIS — Z95820 Peripheral vascular angioplasty status with implants and grafts: Secondary | ICD-10-CM | POA: Diagnosis not present

## 2019-07-14 DIAGNOSIS — Z955 Presence of coronary angioplasty implant and graft: Secondary | ICD-10-CM

## 2019-07-15 LAB — BASIC METABOLIC PANEL
BUN/Creatinine Ratio: 10 (ref 10–24)
BUN: 12 mg/dL (ref 8–27)
CO2: 21 mmol/L (ref 20–29)
Calcium: 9.3 mg/dL (ref 8.6–10.2)
Chloride: 104 mmol/L (ref 96–106)
Creatinine, Ser: 1.23 mg/dL (ref 0.76–1.27)
GFR calc Af Amer: 69 mL/min/{1.73_m2} (ref >59)
GFR calc non Af Amer: 60 mL/min/{1.73_m2} (ref >59)
Glucose: 96 mg/dL (ref 65–99)
Potassium: 4.5 mmol/L (ref 3.5–5.2)
Sodium: 139 mmol/L (ref 134–144)

## 2019-07-17 ENCOUNTER — Encounter (HOSPITAL_COMMUNITY)
Admission: RE | Admit: 2019-07-17 | Discharge: 2019-07-17 | Disposition: A | Discharge status: Home / Self Care | Payer: 59 | Source: Ambulatory Visit | Attending: Cardiology | Admitting: Cardiology

## 2019-07-17 ENCOUNTER — Other Ambulatory Visit: Payer: Self-pay

## 2019-07-17 VITALS — Ht 71.25 in | Wt 240.0 lb

## 2019-07-17 DIAGNOSIS — Z95820 Peripheral vascular angioplasty status with implants and grafts: Secondary | ICD-10-CM | POA: Diagnosis not present

## 2019-07-17 DIAGNOSIS — Z955 Presence of coronary angioplasty implant and graft: Secondary | ICD-10-CM
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
## 2019-07-19 ENCOUNTER — Encounter (HOSPITAL_COMMUNITY)
Admission: RE | Admit: 2019-07-19 | Discharge: 2019-07-19 | Disposition: A | Discharge status: Home / Self Care | Payer: 59 | Source: Ambulatory Visit | Attending: Cardiology | Admitting: Cardiology

## 2019-07-19 ENCOUNTER — Other Ambulatory Visit: Payer: Self-pay

## 2019-07-19 DIAGNOSIS — Z95820 Peripheral vascular angioplasty status with implants and grafts: Secondary | ICD-10-CM | POA: Diagnosis not present

## 2019-07-19 DIAGNOSIS — Z955 Presence of coronary angioplasty implant and graft: Secondary | ICD-10-CM

## 2019-07-21 ENCOUNTER — Other Ambulatory Visit: Payer: Self-pay

## 2019-07-21 ENCOUNTER — Encounter (HOSPITAL_COMMUNITY)
Admission: RE | Admit: 2019-07-21 | Discharge: 2019-07-21 | Disposition: A | Discharge status: Home / Self Care | Payer: 59 | Source: Ambulatory Visit | Attending: Cardiology | Admitting: Cardiology

## 2019-07-21 DIAGNOSIS — Z955 Presence of coronary angioplasty implant and graft: Secondary | ICD-10-CM

## 2019-07-21 DIAGNOSIS — Z95820 Peripheral vascular angioplasty status with implants and grafts: Secondary | ICD-10-CM | POA: Diagnosis not present

## 2019-07-24 ENCOUNTER — Other Ambulatory Visit: Payer: Self-pay

## 2019-07-24 ENCOUNTER — Encounter (HOSPITAL_COMMUNITY)
Admission: RE | Admit: 2019-07-24 | Discharge: 2019-07-24 | Disposition: A | Discharge status: Home / Self Care | Payer: 59 | Source: Ambulatory Visit | Attending: Cardiology | Admitting: Cardiology

## 2019-07-24 DIAGNOSIS — Z95820 Peripheral vascular angioplasty status with implants and grafts: Secondary | ICD-10-CM | POA: Diagnosis not present

## 2019-07-24 DIAGNOSIS — Z955 Presence of coronary angioplasty implant and graft: Secondary | ICD-10-CM

## 2019-07-26 ENCOUNTER — Encounter (HOSPITAL_COMMUNITY)
Admission: RE | Admit: 2019-07-26 | Discharge: 2019-07-26 | Disposition: A | Discharge status: Home / Self Care | Payer: 59 | Source: Ambulatory Visit | Attending: Cardiology | Admitting: Cardiology

## 2019-07-26 ENCOUNTER — Other Ambulatory Visit: Payer: Self-pay

## 2019-07-26 DIAGNOSIS — Z95820 Peripheral vascular angioplasty status with implants and grafts: Secondary | ICD-10-CM | POA: Diagnosis not present

## 2019-07-26 DIAGNOSIS — Z955 Presence of coronary angioplasty implant and graft: Secondary | ICD-10-CM

## 2019-07-28 ENCOUNTER — Encounter (HOSPITAL_COMMUNITY)
Admission: RE | Admit: 2019-07-28 | Discharge: 2019-07-28 | Disposition: A | Discharge status: Home / Self Care | Payer: 59 | Source: Ambulatory Visit | Attending: Cardiology | Admitting: Cardiology

## 2019-07-28 ENCOUNTER — Other Ambulatory Visit: Payer: Self-pay

## 2019-07-28 DIAGNOSIS — Z95820 Peripheral vascular angioplasty status with implants and grafts: Secondary | ICD-10-CM | POA: Diagnosis not present

## 2019-07-28 DIAGNOSIS — Z955 Presence of coronary angioplasty implant and graft: Secondary | ICD-10-CM

## 2019-08-02 ENCOUNTER — Other Ambulatory Visit: Payer: Self-pay

## 2019-08-02 ENCOUNTER — Encounter (HOSPITAL_COMMUNITY)
Admission: RE | Admit: 2019-08-02 | Discharge: 2019-08-02 | Disposition: A | Discharge status: Home / Self Care | Payer: 59 | Source: Ambulatory Visit | Attending: Cardiology | Admitting: Cardiology

## 2019-08-02 DIAGNOSIS — Z955 Presence of coronary angioplasty implant and graft: Secondary | ICD-10-CM

## 2019-08-02 DIAGNOSIS — Z95820 Peripheral vascular angioplasty status with implants and grafts: Secondary | ICD-10-CM | POA: Insufficient documentation

## 2019-08-03 NOTE — Progress Notes (Signed)
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 Components/Risk Factors/Patient Goals Review   Personal Goals Review  Weight Management/Obesity;Lipids;Hypertension       Review  Rush Landmark is doing well with exercise at phase 2 cardiac rehab. Bill's vital signs have been stable. Rush Landmark has not reported any SOB or chest pain during exercise       Expected Outcomes  Bill will continue to participate in phase 2 cardiac rehab for exercise, nutrition and lifestyle modifications.          Core Components/Risk Factors/Patient Goals at Discharge (Final Review):  Goals and Risk Factor Review - 08/03/19 1708      Core Components/Risk Factors/Patient Goals Review   Personal Goals Review  Weight Management/Obesity;Lipids;Hypertension    Review  Rush Landmark is doing well with exercise  at phase 2 cardiac rehab. Bill's vital signs have been stable. Rush Landmark has not reported any SOB or chest pain during exercise    Expected Outcomes  Bill will continue to participate in phase 2 cardiac rehab for exercise, nutrition and lifestyle modifications.       ITP Comments: ITP Comments    Row Name 06/27/19 1049 07/06/19 1628 08/03/19 1706       ITP Comments  Dr. Fransico Him, Medical Director Cardiac Re[facility] [place]  30 Day ITP Review. Rush Landmark will start exercise on 07/10/19  30 Day ITP Review. Rush Landmark has good participation and attendance in phase 2 cardiac rehab.        Comments: See ITP comments.Barnet Pall, RN,BSN 08/03/2019 5:12 PM

## 2019-08-04 ENCOUNTER — Other Ambulatory Visit: Payer: Self-pay

## 2019-08-04 ENCOUNTER — Encounter (HOSPITAL_COMMUNITY)
Admission: RE | Admit: 2019-08-04 | Discharge: 2019-08-04 | Disposition: A | Discharge status: Home / Self Care | Payer: 59 | Source: Ambulatory Visit | Attending: Cardiology | Admitting: Cardiology

## 2019-08-04 DIAGNOSIS — Z955 Presence of coronary angioplasty implant and graft: Secondary | ICD-10-CM

## 2019-08-04 DIAGNOSIS — Z95820 Peripheral vascular angioplasty status with implants and grafts: Secondary | ICD-10-CM | POA: Diagnosis not present

## 2019-08-07 ENCOUNTER — Encounter (HOSPITAL_COMMUNITY)
Admission: RE | Admit: 2019-08-07 | Discharge: 2019-08-07 | Disposition: A | Discharge status: Home / Self Care | Payer: 59 | Source: Ambulatory Visit | Attending: Cardiology | Admitting: Cardiology

## 2019-08-07 ENCOUNTER — Other Ambulatory Visit: Payer: Self-pay

## 2019-08-07 DIAGNOSIS — Z95820 Peripheral vascular angioplasty status with implants and grafts: Secondary | ICD-10-CM | POA: Diagnosis not present

## 2019-08-07 DIAGNOSIS — Z955 Presence of coronary angioplasty implant and graft: Secondary | ICD-10-CM

## 2019-08-09 ENCOUNTER — Encounter (HOSPITAL_COMMUNITY)
Admission: RE | Admit: 2019-08-09 | Discharge: 2019-08-09 | Disposition: A | Discharge status: Home / Self Care | Payer: 59 | Source: Ambulatory Visit | Attending: Cardiology | Admitting: Cardiology

## 2019-08-09 ENCOUNTER — Other Ambulatory Visit: Payer: Self-pay

## 2019-08-09 DIAGNOSIS — Z955 Presence of coronary angioplasty implant and graft: Secondary | ICD-10-CM

## 2019-08-09 DIAGNOSIS — Z95820 Peripheral vascular angioplasty status with implants and grafts: Secondary | ICD-10-CM | POA: Diagnosis not present

## 2019-08-11 ENCOUNTER — Encounter (HOSPITAL_COMMUNITY)
Admission: RE | Admit: 2019-08-11 | Discharge: 2019-08-11 | Disposition: A | Discharge status: Home / Self Care | Payer: 59 | Source: Ambulatory Visit | Attending: Cardiology | Admitting: Cardiology

## 2019-08-11 ENCOUNTER — Other Ambulatory Visit: Payer: Self-pay

## 2019-08-11 DIAGNOSIS — Z955 Presence of coronary angioplasty implant and graft: Secondary | ICD-10-CM

## 2019-08-11 DIAGNOSIS — Z95820 Peripheral vascular angioplasty status with implants and grafts: Secondary | ICD-10-CM | POA: Diagnosis not present

## 2019-08-14 ENCOUNTER — Other Ambulatory Visit: Payer: Self-pay

## 2019-08-14 ENCOUNTER — Encounter (HOSPITAL_COMMUNITY)
Admission: RE | Admit: 2019-08-14 | Discharge: 2019-08-14 | Disposition: A | Discharge status: Home / Self Care | Payer: 59 | Source: Ambulatory Visit | Attending: Cardiology | Admitting: Cardiology

## 2019-08-14 DIAGNOSIS — Z95820 Peripheral vascular angioplasty status with implants and grafts: Secondary | ICD-10-CM | POA: Diagnosis not present

## 2019-08-14 DIAGNOSIS — Z955 Presence of coronary angioplasty implant and graft: Secondary | ICD-10-CM

## 2019-08-16 ENCOUNTER — Other Ambulatory Visit: Payer: Self-pay

## 2019-08-16 ENCOUNTER — Encounter (HOSPITAL_COMMUNITY)
Admission: RE | Admit: 2019-08-16 | Discharge: 2019-08-16 | Disposition: A | Discharge status: Home / Self Care | Payer: 59 | Source: Ambulatory Visit | Attending: Cardiology | Admitting: Cardiology

## 2019-08-16 DIAGNOSIS — Z95820 Peripheral vascular angioplasty status with implants and grafts: Secondary | ICD-10-CM | POA: Diagnosis not present

## 2019-08-16 DIAGNOSIS — Z955 Presence of coronary angioplasty implant and graft: Secondary | ICD-10-CM

## 2019-08-18 ENCOUNTER — Other Ambulatory Visit: Payer: Self-pay

## 2019-08-18 ENCOUNTER — Encounter (HOSPITAL_COMMUNITY)
Admission: RE | Admit: 2019-08-18 | Discharge: 2019-08-18 | Disposition: A | Discharge status: Home / Self Care | Payer: 59 | Source: Ambulatory Visit | Attending: Cardiology | Admitting: Cardiology

## 2019-08-18 DIAGNOSIS — Z95820 Peripheral vascular angioplasty status with implants and grafts: Secondary | ICD-10-CM | POA: Diagnosis not present

## 2019-08-18 DIAGNOSIS — Z955 Presence of coronary angioplasty implant and graft: Secondary | ICD-10-CM

## 2019-08-18 NOTE — Progress Notes (Signed)
Reviewed home exercise prescription with patient. Pt voices walking daily for 15 min x 2-3 per day. Importance of walking at home discussed. Components of safe exercise program reviewed. Encouraged to always take medications before engaging in exercise. Stressed to patient to carry cell phone and Id when walking outdoors. Appropriate clothing and shoe choices discussed. Weather parameters for temperature and humidity reviewed and hydration was encouraged before, during and after exercise. THRR of 61-122 discussed. Warm-up and cool-down encouraged, as well as walking 2.-4 per week for 30-45 minutes. Reviewed safe and proper use of NTG and always encouraged to carry. Reviewed S/S that would require patient to terminate exercise and when to call 911 vs MD. Pt verbalized understanding of exercise prescription and was provided copy.

## 2019-08-21 ENCOUNTER — Encounter (HOSPITAL_COMMUNITY)
Admission: RE | Admit: 2019-08-21 | Discharge: 2019-08-21 | Disposition: A | Discharge status: Home / Self Care | Payer: 59 | Source: Ambulatory Visit | Attending: Cardiology | Admitting: Cardiology

## 2019-08-21 ENCOUNTER — Other Ambulatory Visit: Payer: Self-pay

## 2019-08-21 DIAGNOSIS — Z95820 Peripheral vascular angioplasty status with implants and grafts: Secondary | ICD-10-CM | POA: Diagnosis not present

## 2019-08-21 DIAGNOSIS — Z955 Presence of coronary angioplasty implant and graft: Secondary | ICD-10-CM

## 2019-08-21 NOTE — Progress Notes (Signed)
Nutrition Note  Spoke with pt and reviewed food log. He has stated goal of weight loss. He does not snack and per food log, we eats <2000 kcals/day.  Breakfast: Cereal (special k or cheerios) with 1% milk Lunch: granola bar and water Dinner: Misc - hot dog, pizza, lasagna, lean meats and greens.   Dinner is highest calorie intake but paired with very small breakfast and lunch and no snacks. No sugary drinks. He has stopped eating out at breakfast. 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 We discussed physical activity and exercise. Recommended increasing both. Recommended choosing more fruits and veggies to increase diet quality (fiber, vitamins, etc). He typically chooses a higher carb meal so we also discussed shifting to protein focused meals for weight loss. He is amenable to these ideas. Provided handouts. Overall, his diet is not high calorie but he is sedentary for most of the day.  Will continue to monitor through cardiac rehab.  Michaele Offer, MS, RDN, LDN

## 2019-08-23 ENCOUNTER — Other Ambulatory Visit: Payer: Self-pay

## 2019-08-23 ENCOUNTER — Encounter (HOSPITAL_COMMUNITY)
Admission: RE | Admit: 2019-08-23 | Discharge: 2019-08-23 | Disposition: A | Discharge status: Home / Self Care | Payer: 59 | Source: Ambulatory Visit | Attending: Cardiology | Admitting: Cardiology

## 2019-08-23 DIAGNOSIS — Z955 Presence of coronary angioplasty implant and graft: Secondary | ICD-10-CM

## 2019-08-23 DIAGNOSIS — Z95820 Peripheral vascular angioplasty status with implants and grafts: Secondary | ICD-10-CM | POA: Diagnosis not present

## 2019-08-24 NOTE — Progress Notes (Signed)
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 while maintaining appropriate intake of nutrient and fiber, sodium and fats, and appropriate energy expenditure required for the weight goal.;Weight Management: Provide education and appropriate resources to help participant work on and attain dietary goals.;Weight Management/Obesity: Establish reasonable short  term and long term weight goals.;Obesity: Provide education and appropriate resources to help participant work on and attain dietary goals.    Admit Weight 240 lb 11.9 oz (109.2 kg)    Hypertension Yes    Intervention Provide education on lifestyle modifcations including regular physical activity/exercise, weight management, moderate sodium restriction and increased consumption of fresh  fruit, vegetables, and low fat dairy, alcohol moderation, and smoking cessation.;Monitor prescription use compliance.    Expected Outcomes Short Term: Continued assessment and intervention until BP is < 140/32mm HG in hypertensive participants. < 130/4mm HG in hypertensive participants with diabetes, heart failure or chronic kidney disease.;Long Term: Maintenance of blood pressure at goal levels.    Lipids Yes    Intervention Provide education and support for participant on nutrition & aerobic/resistive exercise along with prescribed medications to achieve LDL 70mg , HDL >40mg .    Expected Outcomes Short Term: Participant states understanding of desired cholesterol values and is compliant with medications prescribed. Participant is following exercise prescription and nutrition guidelines.;Long Term: Cholesterol controlled with medications as prescribed, with individualized exercise RX and with personalized nutrition plan. Value goals: LDL < 70mg , HDL > 40 mg.    Personal Goal Other Yes    Personal Goal To have less SOB and be able to hike with his daughter again    Intervention Attend cadiac rehab 3x/week and walk at home 2-3 days/week    Expected Outcomes improved exercise tolerance, decreased SOB, increased stamina           Core Components/Risk Factors/Patient Goals Review:   Goals and Risk Factor Review    Row Name 08/03/19 1708 08/24/19 1623           Core Components/Risk Factors/Patient Goals Review   Personal Goals Review Weight Management/Obesity;Lipids;Hypertension Weight Management/Obesity;Lipids;Hypertension      Review Rush Landmark is doing well with exercise at phase 2 cardiac rehab. Bill's vital signs have been stable. Rush Landmark has not reported any SOB or chest pain during exercise Rush Landmark is doing well with exercise at phase 2 cardiac rehab. Bill's vital signs have been stable. Rush Landmark continues to do well with exercise      Expected Outcomes Rush Landmark will continue to participate in phase 2 cardiac  rehab for exercise, nutrition and lifestyle modifications. Rush Landmark will continue to participate in phase 2 cardiac rehab for exercise, nutrition and lifestyle modifications.             Core Components/Risk Factors/Patient Goals at Discharge (Final Review):   Goals and Risk Factor Review - 08/24/19 1623      Core Components/Risk Factors/Patient Goals Review   Personal Goals Review Weight Management/Obesity;Lipids;Hypertension    Review Rush Landmark is doing well with exercise at phase 2 cardiac rehab. Bill's vital signs have been stable. Rush Landmark continues to do well with exercise    Expected Outcomes Rush Landmark will continue to participate in phase 2 cardiac rehab for exercise, nutrition and lifestyle modifications.           ITP Comments:  ITP Comments    Row Name 06/27/19 1049 07/06/19 1628 08/03/19 1706 08/24/19 1622     ITP Comments Dr. Fransico Him, Medical Director Card[facility] Re[facility] [place] 30 Day ITP Review. Rush Landmark will start exercise on 07/10/19 30 Day ITP Review. Rush Landmark has good participation and attendance in phase 2 cardiac rehab. 30 Day ITP Review. Rush Landmark has good participation and attendance in phase 2 cardiac rehab.           Comments: See ITP comments.Barnet Pall, RN,BSN 08/24/2019 4:26  PM

## 2019-08-25 ENCOUNTER — Other Ambulatory Visit: Payer: Self-pay

## 2019-08-25 ENCOUNTER — Encounter (HOSPITAL_COMMUNITY)
Admission: RE | Admit: 2019-08-25 | Discharge: 2019-08-25 | Disposition: A | Discharge status: Home / Self Care | Payer: 59 | Source: Ambulatory Visit | Attending: Cardiology | Admitting: Cardiology

## 2019-08-25 VITALS — Ht 71.25 in

## 2019-08-25 DIAGNOSIS — Z95820 Peripheral vascular angioplasty status with implants and grafts: Secondary | ICD-10-CM | POA: Diagnosis not present

## 2019-08-25 DIAGNOSIS — Z955 Presence of coronary angioplasty implant and graft: Secondary | ICD-10-CM

## 2019-08-28 ENCOUNTER — Encounter (HOSPITAL_COMMUNITY)
Admission: RE | Admit: 2019-08-28 | Discharge: 2019-08-28 | Disposition: A | Discharge status: Home / Self Care | Payer: 59 | Source: Ambulatory Visit | Attending: Cardiology | Admitting: Cardiology

## 2019-08-28 ENCOUNTER — Other Ambulatory Visit: Payer: Self-pay

## 2019-08-28 VITALS — Wt 236.8 lb

## 2019-08-28 DIAGNOSIS — Z95820 Peripheral vascular angioplasty status with implants and grafts: Secondary | ICD-10-CM | POA: Diagnosis not present

## 2019-08-28 DIAGNOSIS — Z955 Presence of coronary angioplasty implant and graft: Secondary | ICD-10-CM

## 2019-08-30 ENCOUNTER — Other Ambulatory Visit: Payer: Self-pay

## 2019-08-30 ENCOUNTER — Encounter (HOSPITAL_COMMUNITY)
Admission: RE | Admit: 2019-08-30 | Discharge: 2019-08-30 | Disposition: A | Discharge status: Home / Self Care | Payer: 59 | Source: Ambulatory Visit | Attending: Cardiology | Admitting: Cardiology

## 2019-08-30 DIAGNOSIS — Z95820 Peripheral vascular angioplasty status with implants and grafts: Secondary | ICD-10-CM | POA: Diagnosis not present

## 2019-08-30 DIAGNOSIS — Z955 Presence of coronary angioplasty implant and graft: Secondary | ICD-10-CM

## 2019-09-01 ENCOUNTER — Other Ambulatory Visit: Payer: Self-pay

## 2019-09-01 ENCOUNTER — Encounter (HOSPITAL_COMMUNITY)
Admission: RE | Admit: 2019-09-01 | Discharge: 2019-09-01 | Disposition: A | Discharge status: Home / Self Care | Payer: 59 | Source: Ambulatory Visit | Attending: Cardiology | Admitting: Cardiology

## 2019-09-01 DIAGNOSIS — Z95820 Peripheral vascular angioplasty status with implants and grafts: Secondary | ICD-10-CM | POA: Insufficient documentation

## 2019-09-01 DIAGNOSIS — Z955 Presence of coronary angioplasty implant and graft: Secondary | ICD-10-CM

## 2019-09-01 NOTE — Progress Notes (Signed)
Discharge Progress Report  Patient Details  Name: Willie Dominguez MRN: [id number] Date of Birth: 04/30/1950 Referring Provider:     [place] from 06/27/2019 in [place]  Referring Provider Dr. Virgina Jock       Number of Visits: 23  Reason for Discharge:  Patient reached a stable level of exercise. Patient independent in their exercise. Patient has met program and personal goals.  Smoking History:  Social History   Tobacco Use  Smoking Status Never Smoker  Smokeless Tobacco Never Used    Diagnosis:  S/P coronary artery stent placement  ADL [facility]:   Initial Exercise Prescription:  Initial Exercise Prescription - 06/27/19 1100      Date of Initial Exercise RX and Referring Provider   Date 06/27/19    Referring Provider Dr. Virgina Jock    Expected Discharge Date 09/01/19      Recumbant Bike   Level --    RPM --    Minutes --    METs --      NuStep   Level 2    SPM 60    Minutes 15    METs 2      Arm Ergometer   Level 1    Watts 35    RPM 40    Minutes 15    METs 2      Prescription Details   Frequency (times per week) 3    Duration Progress to 30 minutes of continuous aerobic without signs/symptoms of physical distress      Intensity   THRR 40-80% of Max Heartrate 61-122    Ratings of Perceived Exertion 11-13    Perceived Dyspnea 0-4      Progression   Progression Continue progressive overload as per policy without signs/symptoms or physical distress.      Resistance Training   Training Prescription Yes    Weight 3    Reps 10-15           Discharge Exercise Prescription (Final Exercise Prescription Changes):  Exercise Prescription Changes - 08/16/19 1600      Response to Exercise   Blood Pressure (Admit) 120/66    Blood Pressure (Exercise) 130/62    Blood Pressure (Exit) 102/70    Heart Rate (Admit) 82 bpm    Heart Rate (Exercise) 106 bpm    Heart Rate (Exit) 67 bpm     Rating of Perceived Exertion (Exercise) 13    Perceived Dyspnea (Exercise) 0    Symptoms None    Comments Increased workloads on nustep    Duration Progress to 30 minutes of  aerobic without signs/symptoms of physical distress    Intensity THRR unchanged      Progression   Average METs 3.6      Resistance Training   Training Prescription Yes    Weight 4lbs      Treadmill   MPH 2    Grade 5    Minutes 15    METs 3.9      NuStep   Level 6    SPM 85    Minutes 15    METs 3.4      Home Exercise Plan   Plans to continue exercise at Home (comment)    Frequency Add 3 additional days to program exercise sessions.           Functional Capacity:  6 Minute Walk    Row Name 06/27/19 1023 08/28/19 1619  6 Minute Walk   Phase Initial Discharge    Distance 1305 feet 1586 feet    Distance % Change -- 21.53 %    Distance Feet Change -- 281 ft    Walk Time 6 minutes 6 minutes    # of Rest Breaks 0 0    MPH 2.36 3    METS 2.21 3.1    RPE 9 11    Perceived Dyspnea  1 1    VO2 Peak 7.74 10.86    Symptoms Yes (comment) No    Comments Pt had SOB, 1 on dyspena scale. Pt had pain in left foot; 3 out of 10 on 0-10 scale. Bilateral foot pain # 5 on 0-10 scale, chronic. RPD= 1    Resting HR 55 bpm 64 bpm    Resting BP 92/49 100/62    Resting Oxygen Saturation  96 % 97 %    Exercise Oxygen Saturation  during 6 min walk 97 % 97 %    Max Ex. HR 73 bpm 92 bpm    Max Ex. BP 106/63 124/66    2 Minute Post BP 97/62 112/60           Psychological, QOL, Others - Outcomes: PHQ 2/9: Depression screen [facility] 2/9 09/01/2019 06/27/2019 05/19/2019 08/17/2016 08/27/2015  Decreased Interest 0 0 0 0 0  Down, Depressed, Hopeless 0 0 0 0 0  PHQ - 2 Score 0 0 0 0 0    Quality of Life:  Quality of Life - 08/29/19 0753      Quality of Life Scores   Health/Function Post 23.2 %    Socioeconomic Post 25 %    Psych/Spiritual Post 23.93 %    Family Post 27.1 %    GLOBAL Post 24.29 %            Personal Goals: Goals established at orientation with interventions provided to work toward goal.  Personal Goals and Risk Factors at Admission - 06/27/19 1217      Core Components/Risk Factors/Patient Goals on Admission    Weight Management Yes    Intervention Weight Management: Develop a combined nutrition and exercise program designed to reach desired caloric intake, while maintaining appropriate intake of nutrient and fiber, sodium and fats, and appropriate energy expenditure required for the weight goal.;Weight Management: Provide education and appropriate resources to help participant work on and attain dietary goals.;Weight Management/Obesity: Establish reasonable short term and long term weight goals.;Obesity: Provide education and appropriate resources to help participant work on and attain dietary goals.    Admit Weight 240 lb 11.9 oz (109.2 kg)    Hypertension Yes    Intervention Provide education on lifestyle modifcations including regular physical activity/exercise, weight management, moderate sodium restriction and increased consumption of fresh fruit, vegetables, and low fat dairy, alcohol moderation, and smoking cessation.;Monitor prescription use compliance.    Expected Outcomes Short Term: Continued assessment and intervention until BP is < 140/45m HG in hypertensive participants. < 130/864mHG in hypertensive participants with diabetes, heart failure or chronic kidney disease.;Long Term: Maintenance of blood pressure at goal levels.    Lipids Yes    Intervention Provide education and support for participant on nutrition & aerobic/resistive exercise along with prescribed medications to achieve LDL <708mHDL >36m15m  Expected Outcomes Short Term: Participant states understanding of desired cholesterol values and is compliant with medications prescribed. Participant is following exercise prescription and nutrition guidelines.;Long Term: Cholesterol controlled with medications as  prescribed, with individualized exercise RX  and with personalized nutrition plan. Value goals: LDL < 24m, HDL > 40 mg.    Personal Goal Other Yes    Personal Goal To have less SOB and be able to hike with his daughter again    Intervention Attend cadiac rehab 3x/week and walk at home 2-3 days/week    Expected Outcomes improved exercise tolerance, decreased SOB, increased stamina            Personal Goals Discharge:  Goals and Risk Factor Review    Row Name 08/03/19 1708 08/24/19 1623 09/01/19 1523         Core Components/Risk Factors/Patient Goals Review   Personal Goals Review Weight Management/Obesity;Lipids;Hypertension Weight Management/Obesity;Lipids;Hypertension Weight Management/Obesity;Lipids;Hypertension     Review BRush Landmarkis doing well with exercise at phase 2 cardiac rehab. Bill's vital signs have been stable. BRush Landmarkhas not reported any SOB or chest pain during exercise BRush Landmarkis doing well with exercise at phase 2 cardiac rehab. Bill's vital signs have been stable. BRush Landmarkcontinues to do well with exercise Bill completed cardiac rehab on 09/01/19. Bill did well with exercise and had no reports of angina. Bill plans to continue exercise by waking his dog at home and doing things around the house.     Expected Outcomes BRush Landmarkwill continue to participate in phase 2 cardiac rehab for exercise, nutrition and lifestyle modifications. BRush Landmarkwill continue to participate in phase 2 cardiac rehab for exercise, nutrition and lifestyle modifications. BRush Landmarkwill continue exercise upon completion of phase 2 cardiac rehab, follow lifestyle and diet modifications            Exercise Goals and Review:  Exercise Goals    Row Name 06/27/19 1151             Exercise Goals   Increase Physical Activity Yes       Intervention Provide advice, education, support and counseling about physical activity/exercise needs.;Develop an individualized exercise prescription for aerobic and resistive training based on  initial evaluation findings, risk stratification, comorbidities and participant's personal goals.       Expected Outcomes Short Term: Attend rehab on a regular basis to increase amount of physical activity.;Long Term: Add in home exercise to make exercise part of routine and to increase amount of physical activity.;Long Term: Exercising regularly at least 3-5 days a week.       Increase Strength and Stamina Yes       Intervention Provide advice, education, support and counseling about physical activity/exercise needs.;Develop an individualized exercise prescription for aerobic and resistive training based on initial evaluation findings, risk stratification, comorbidities and participant's personal goals.       Expected Outcomes Short Term: Increase workloads from initial exercise prescription for resistance, speed, and METs.;Short Term: Perform resistance training exercises routinely during rehab and add in resistance training at home;Long Term: Improve cardiorespiratory fitness, muscular endurance and strength as measured by increased METs and functional capacity (6MWT)       Able to understand and use rate of perceived exertion (RPE) scale Yes       Intervention Provide education and explanation on how to use RPE scale       Expected Outcomes Short Term: Able to use RPE daily in rehab to express subjective intensity level;Long Term:  Able to use RPE to guide intensity level when exercising independently       Able to understand and use Dyspnea scale Yes       Intervention Provide education and explanation on how to use Dyspnea  scale       Expected Outcomes Short Term: Able to use Dyspnea scale daily in rehab to express subjective sense of shortness of breath during exertion;Long Term: Able to use Dyspnea scale to guide intensity level when exercising independently       Knowledge and understanding of Target Heart Rate Range (THRR) Yes       Intervention Provide education and explanation of THRR  including how the numbers were predicted and where they are located for reference       Expected Outcomes Short Term: Able to state/look up THRR;Long Term: Able to use THRR to govern intensity when exercising independently;Short Term: Able to use daily as guideline for intensity in rehab       Able to check pulse independently Yes       Intervention Provide education and demonstration on how to check pulse in carotid and radial arteries.;Review the importance of being able to check your own pulse for safety during independent exercise       Expected Outcomes Short Term: Able to explain why pulse checking is important during independent exercise       Understanding of Exercise Prescription Yes       Intervention Provide education, explanation, and written materials on patient's individual exercise prescription       Expected Outcomes Short Term: Able to explain program exercise prescription;Long Term: Able to explain home exercise prescription to exercise independently              Exercise Goals Re-Evaluation:  Exercise Goals Re-Evaluation    Row Name 07/10/19 1632 08/03/19 1544 08/16/19 1627 08/18/19 1639       Exercise Goal Re-Evaluation   Exercise Goals Review Increase Physical Activity;Increase Strength and Stamina;Able to understand and use rate of perceived exertion (RPE) scale;Knowledge and understanding of Target Heart Rate Range (THRR);Able to check pulse independently;Understanding of Exercise Prescription Increase Physical Activity;Increase Strength and Stamina;Able to understand and use rate of perceived exertion (RPE) scale;Knowledge and understanding of Target Heart Rate Range (THRR);Able to check pulse independently;Understanding of Exercise Prescription Increase Physical Activity;Increase Strength and Stamina;Able to understand and use rate of perceived exertion (RPE) scale;Knowledge and understanding of Target Heart Rate Range (THRR);Able to check pulse independently;Understanding  of Exercise Prescription Increase Physical Activity;Increase Strength and Stamina;Able to understand and use rate of perceived exertion (RPE) scale;Knowledge and understanding of Target Heart Rate Range (THRR);Understanding of Exercise Prescription;Able to check pulse independently    Comments Initial exercise session. Pt tolerated low level exercise well witrh no complaints. Pt continuing to put forth good effort with exercise. Will continue to monitor and progress workloads as tolerated. Pt is progressing with his exercise. Will continute to monitor wokloads and increase as tolerated. Will review home exercise RX next session. Discussed home exercise prescription with patient. He currenty walks 2-3x/day for 15 minutes.    Expected Outcomes -- Pt will continue to increase cardiovascular fitness. Pt will continue to increase cardiovascular  fitness. Pt will walk 2-3 days per week for 30-45 minutes and abide by the guidelines contained in the exercise prescription.           Nutrition & Weight - Outcomes:  Pre Biometrics - 06/27/19 1156      Pre Biometrics   Height 5' 11.25" (1.81 m)    Weight 109.2 kg    Waist Circumference 47 inches    Hip Circumference 44 inches    Waist to Hip Ratio 1.07 %    BMI (Calculated) 33.33  Triceps Skinfold 14 mm    % Body Fat 32.3 %    Grip Strength 42 kg    Flexibility 9.5 in    Single Leg Stand 4.43 seconds           Post Biometrics - 08/25/19 1615       Post  Biometrics   Height 5' 11.25" (1.81 m)    Waist Circumference 47 inches    Hip Circumference 44 inches    Waist to Hip Ratio 1.07 %    Triceps Skinfold 12.5 mm    Grip Strength 42.5 kg    Flexibility 12 in    Single Leg Stand 13.02 seconds           Nutrition:  Nutrition Therapy & Goals - 07/18/19 1413      Nutrition Therapy   Diet Heart healthy    Drug/Food Interactions Statins/Certain Fruits      Personal Nutrition Goals   Nutrition Goal Pt to identify food quantities  necessary to achieve weight loss of 6-24 lb at graduation from cardiac rehab.    Personal Goal #2 Pt to build a healthy plate including vegetables, fruits, whole grains, and low-fat dairy products in a heart healthy meal plan.      Intervention Plan   Intervention Prescribe, educate and counsel regarding individualized specific dietary modifications aiming towards targeted core components such as weight, hypertension, lipid management, diabetes, heart failure and other comorbidities.;Nutrition handout(s) given to patient.    Expected Outcomes Short Term Goal: A plan has been developed with personal nutrition goals set during dietitian appointment.;Long Term Goal: Adherence to prescribed nutrition plan.           Nutrition Discharge:  Nutrition Assessments - 07/06/19 0844      MEDFICTS Scores   Pre Score 57           Education Questionnaire Score:  Knowledge Questionnaire Score - 08/29/19 0752      Knowledge Questionnaire Score   Post Score 20/24           Goals reviewed with patient; copy given to patient.Bill graduated from cardiac rehab program today with completion of 23 exercise sessions in Phase II. Pt maintained good attendance and progressed nicely during his participation in rehab as evidenced by increased MET level.   Medication list reconciled. Repeat  PHQ score- 0 .  Pt has made significant lifestyle changes and should be commended for his success. Pt feels he has achieved his goals during cardiac rehab.   Pt plans to continue exercise by walking his dog and doing things around the house. Rush Landmark says he is not sure that he met his goals as he felt he could not breath as well as he could if he was not wearing a mask. Bill increased his distance by 281 feet. We are proud of Bill's progress. Barnet Pall, RN,BSN 09/13/2019 2:09 PM

## 2019-09-05 ENCOUNTER — Encounter (HOSPITAL_COMMUNITY): Payer: Self-pay

## 2019-09-12 ENCOUNTER — Other Ambulatory Visit: Payer: Self-pay | Admitting: Cardiology

## 2019-09-18 ENCOUNTER — Ambulatory Visit: Payer: 59 | Admitting: Cardiology

## 2019-09-18 ENCOUNTER — Encounter: Payer: Self-pay | Admitting: Cardiology

## 2019-09-18 ENCOUNTER — Other Ambulatory Visit: Payer: Self-pay

## 2019-09-18 VITALS — BP 129/79 | HR 56 | Resp 17 | Ht 71.0 in | Wt 239.0 lb

## 2019-09-18 DIAGNOSIS — Z86711 Personal history of pulmonary embolism: Secondary | ICD-10-CM

## 2019-09-18 DIAGNOSIS — I10 Essential (primary) hypertension: Secondary | ICD-10-CM

## 2019-09-18 DIAGNOSIS — I25118 Atherosclerotic heart disease of native coronary artery with other forms of angina pectoris: Secondary | ICD-10-CM

## 2019-09-18 NOTE — Progress Notes (Signed)
Patient referred by [name], [name], MD for exertional chest pain and dyspnea  Subjective:   Willie Dominguez, male    DOB: 11/28/1950, 69 y.o.   MRN: [id number]   Chief Complaint  Patient presents with   Hypertension   Coronary Artery Disease   Follow-up    HPI  69 year old Caucasian male with hypertension, hyperlipidemia, history of bilateral lower extremity DVT and PE in 01/2018, family h/o CAD,with exertional dyspnea.  Patient originally saw me in September 2020 for symptoms of shortness of breath.  His symptoms seem to have worsened since his PE in December 2019.  However, on further review, the symptoms were present even prior to that.  He never had any significant chest pain symptoms.  Patient's subsequent CT angiogram in January 2020 did not show any PE. Work-up with echocardiogram did not show any pulmonary hypertension.  Stress test was equivocal.  I had initially referred him to [facility] pulmonology, but it appears that he never heard from them.  In the meantime, given his worsening symptoms all persistent shortness of breath, angina equivalent remains in the differential.  Thus, I recommended right and left heart catheterization and pulmonary angiography.    Cath showed no evidence of pulmonary hypertension, but showed severe small PDA bifurcation stenosis, severe stenosis and small caliber ramus, and dFR positive LAD stenosis.  He underwent successful LAD stenting with post PCI DFR showing residual nonsignificant disease distally.  Given the amount of contrast used, I decided to stage.  Intervention in the future, as clinically necessary.   04/2019: Patient had not had any improvement in her shortness of breath symptoms.  Matter-of-fact, shortness of breath has worsened in the past 1 week.  He shortness of breath is present at both rest and exertion.  He also complains of dry cough, which is new.  He does not have any fever, chills, or other infectious symptoms.  He has  had occasional "twinge" in his chest that lasts for a few seconds, but also has a separate chest pressure that is constant 24X7.  Patient was seen by [facility] pulmonology in [facility] (04/2019) . His pulmonary function test indicated restrictive pattern. He was started on Spiriva. He was also seen by Rheumatology (05/2019) for ANA speckled pattern 1:320. Further workup is ongoing.   05/2019: In last four weeks, he has had one episode of chest pressure while working in the yard. His episodes of shortness of breath, cough continue. He has noticed some leg swelling.   06/2019: Since starting Spiriva, his shortness of breath and cough have had slight improvement. Shortness of breath is more prominent on walking up the hill or walking upstairs. He has not had any recurrence of chest pressure sometimes. His blood pressure is running higher than usual. He has been off Valsartan as were trying to elucidate etiology of his cough.   Today, he tells me that his dentist Dr. Glorious Peach has recommend third molar extraction, which has been delayed for a long time. He also needs colon polyp removal. His gastroenterologist Dr. Mallie Mussel had indicated to me that this can wait till September 2021, if he has ongoing cardiac issues.   08/2019: He has continued to have exertional dyspnea. Pulmonary management has not helped much, nor has cardiac rehab.   Colon polyp removal is currently scheduled for October/November 2021.   Current Outpatient Medications on File Prior to Visit  Medication Sig Dispense Refill   acetaminophen (TYLENOL) 500 MG tablet Take 500-1,000 mg by mouth  every 6 (six) hours as needed (for pain.).     aspirin EC 81 MG tablet Take 81 mg by mouth daily.     Cholecalciferol (VITAMIN D-3) 125 MCG (5000 UT) TABS Take 5,000 Units by mouth daily.     clopidogrel (PLAVIX) 75 MG tablet TAKE 1 TABLET BY MOUTH EVERY DAY 30 tablet 1   Coenzyme Q10 (COQ10) 100 MG CAPS Take 100 mg by mouth daily.      rosuvastatin (CRESTOR) 20 MG tablet Take 1 tablet (20 mg total) by mouth daily. Please disregard another order for `0 mg from earlier today (Patient taking differently: Take 20 mg by mouth daily. ) 90 tablet 3   Tiotropium Bromide Monohydrate (SPIRIVA RESPIMAT) 2.5 MCG/ACT AERS Inhale 2.5 mcg into the lungs 2 (two) times daily.     valsartan (DIOVAN) 80 MG tablet Take 80 mg by mouth daily.     vitamin B-12 (CYANOCOBALAMIN) 1000 MCG tablet Take 1,000 mcg by mouth daily.     nitroGLYCERIN (NITROSTAT) 0.4 MG SL tablet Place 1 tablet (0.4 mg total) under the tongue every 5 (five) minutes as needed for chest pain. (Patient not taking: Reported on 09/18/2019) 30 tablet 3   No current facility-administered medications on file prior to visit.    Cardiovascular studies:  CTA chest 04/28/2019: 1.  No demonstrable pulmonary embolus. 2. Prominence of the ascending thoracic aorta with a maximum transverse diameter of 4.2 x 4.2 cm. Recommend annual imaging followup by CTA or MRA. This recommendation follows 2010 ACCF/AHA/AATS/ACR/ASA/SCA/SCAI/SIR/STS/SVM Guidelines for the Diagnosis and Management of Patients with Thoracic Aortic Disease. Circulation. 2010; 121: G269-S854. Aortic aneurysm NOS (ICD10-I71.9). No dissection. 3. Evidence of occasional calcified granulomas. No edema or airspace opacity. Areas of slight atelectatic change. 4.  No demonstrable adenopathy.  EKG 04/27/2019: Sinus rhythm 58 bpm.  LHC/RHC/coronary intervention 04/18/2019: LM: Normal LAD: Prox-mid 80% stenosis and LAD/D1 bifurcation (1,0,0). Moderate disease in prox Diag.         LAD dFR 0.87        Successful percutaneous coronary intervention prox LAD        PTCA and stent placement 2.75 X 12 mm Resolute Onyx drug-eluting stent        Post dilatation with 3.0X8 mm [place] balloon           Post PCI LAD dFR 0.95 RI: Prox 80% disease. Small caliber vessel. LCx: No significant stenosis. RCA: Distal 40%, PDA bifurcation 90%  stenosis in small caliber vessel.  Right heart cath: RA: 5 mmHg RV: 27/6 mmHg PA: 26/14 mmHg, mean PA 19 mmHg PCW: 15 mmHg  If residual dyspnea symptoms, will stage PDA bifurcation stenosis stenting in future.  Lexiscan Myoview Stress Test 12/26/2018: Stress EKG is non-diagnostic, as this is pharmacological stress test. Although the t.i.d. index was normal at 0.95, left ventricle appeared to be mildly dilated in stress images with LV end-diastolic volume of 627 mL. The perfusion images reveals normal perfusion, however gated images reveal suggestion of mild mid anterior and septal hypokinesis.  LVEF was at the lower limit of normal at 50%. Intermediate risk study.  Clinical correlation recommended. No previous exam available for comparison.  Echocardiogram 12/02/2018: Left ventricle cavity is normal in size. Mild concentric hypertrophy of the left ventricle. Normal LV systolic function with EF 55%. Normal global wall motion. Normal diastolic filling pattern.  The aortic root is mildly dilated at 4.1 cm. Left atrial cavity is normal in size. Aneurysmal interatrial septum without 2D or color Doppler evidence of interatrial  shunt. Right ventricle cavity is mildly dilated. Normal right ventricular function. Trileaflet aortic valve. Trace aortic regurgitation. Mild (Grade I) mitral regurgitation. Mild tricuspid regurgitation. Estimated pulmonary artery systolic pressure is 23 mmHg. No significant change compared to previous study on 02/17/2018.   EKG 11/24/2018: Sinus rhythm 66 bpm. Left ventricular hypertrophy.   LE venous duplex US 03/2018: Left: Findings consistent with age indeterminate deep vein thrombosis involving the left popliteal vein, and left peroneal vein. No cystic structure found in the popliteal fossa. Compared to previous study, femoral vein and gastroc vein thrombus has  Resolved.  LE venous duplex US 01/2018: Right: Findings consistent with acute deep vein thrombosis  involving the right peroneal vein. Left: Findings consistent with acute deep vein thrombosis involving the left proximal femoral vein, left gastrocnemius vein, left popliteal vein, left posterior tibial vein, and left peroneal vein.   Echocardiogram 01/2018: Normal-sized LV.  EF 60-65%.  Normal diastolic function. Mildly dilated ascending aorta 4.2 cm. No significant valvular abnormality. Mildly dilated RV with normal systolic function.  RVSP 27 mmHg.  Nuclear stress test 09/2016:  The left ventricular ejection fraction is mildly decreased (45-54%).  Nuclear stress EF: 52%.  Blood pressure demonstrated a blunted response to exercise.  There was no ST segment deviation noted during stress.  Defect 1: There is a small defect of moderate severity present in the apex location.  This is a low risk study.   Low risk stress nuclear study with very mild apical ischemia; EF 52 (low normal to mild global reduction in LV function); mild LVE.  Recent labs: 07/14/2019: Glucose 96, BUN/Cr 12/1.23. EGFR 60. Na/K 139/4.5. Rest of the CMP normal Chol 106, TG 75, HDL 44, LDL 46  10/10/2018: Glucose 107, BUN/Cr 14/1.12. EGFR 65. Na/K 140/4.4. Rest of the CMP normal H/H 14/42. MCV 92. Platelets 152 Chol 192, TG 131, HDL 44, LDL 123  Review of Systems  Review of Systems  Cardiovascular: Positive for dyspnea on exertion and leg swelling. Negative for chest pain, palpitations and syncope.  Respiratory: Positive for cough (Dry) and shortness of breath.         Vitals:   09/18/19 1139  BP: 129/79  Pulse: (!) 56  Resp: 17  SpO2: 98%     Body mass index is 33.33 kg/m. Filed Weights   09/18/19 1139  Weight: 239 lb (108.4 kg)     Objective:   Physical Exam  Physical Exam Vitals and nursing note reviewed.  Constitutional:      General: He is not in acute distress. Neck:     Vascular: No JVD.  Cardiovascular:     Rate and Rhythm: Normal rate and regular rhythm.     Heart sounds:  Normal heart sounds. No murmur heard.   Pulmonary:     Effort: Pulmonary effort is normal.     Breath sounds: Normal breath sounds. No wheezing or rales.        Assessment & Recommendations:   69 year old Caucasian male with hypertension, hyperlipidemia, history of bilateral lower extremity DVT and PE (01/2018), CAD, persistent dyspnea and new cough.  Exertional dyspnea, cough: While the patient has coronary artery disease  (s/p mid LAD PCI 04/2019, residual severe disease in small caliber PDA and ramus, mod residual disease in mid to distal LAD), he had no improvement in shortness of breath with LAD PCI.  However, with no other etiology identified for his dyspnea, he would like to revisit residual CAD as the etiology. He requests if he could repeat  coronary angiography and consider revascularization.   Risks/benefits/expectations discussed with the patient. Will plan for 8/2.   He is aware that colon polyp may need to be postponed by another 6 months after possible revascularization.   Continue aspirin, Plavix, high intensity statin, amlodipine. Not on beta-blocker due to resting bradycardia.  Hypertension: Well controlled.   F/u after cath  Nigel Mormon, MD [facility] Pager: [phone] Office: [phone] If no answer Cell [phone]

## 2019-09-29 ENCOUNTER — Other Ambulatory Visit (HOSPITAL_COMMUNITY)
Admission: RE | Admit: 2019-09-29 | Discharge: 2019-09-29 | Disposition: A | Discharge status: Home / Self Care | Payer: 59 | Source: Ambulatory Visit | Attending: Cardiology | Admitting: Cardiology

## 2019-09-29 DIAGNOSIS — Z01812 Encounter for preprocedural laboratory examination: Secondary | ICD-10-CM | POA: Diagnosis present

## 2019-09-29 DIAGNOSIS — Z20822 Contact with and (suspected) exposure to covid-19: Secondary | ICD-10-CM | POA: Diagnosis not present

## 2019-09-29 LAB — SARS CORONAVIRUS 2 (TAT 6-24 HRS): SARS Coronavirus 2: NEGATIVE

## 2019-10-03 ENCOUNTER — Ambulatory Visit (HOSPITAL_COMMUNITY)
Admission: RE | Admit: 2019-10-03 | Discharge: 2019-10-03 | Disposition: A | Discharge status: Home / Self Care | Payer: 59 | Attending: Cardiology | Admitting: Cardiology

## 2019-10-03 ENCOUNTER — Encounter (HOSPITAL_COMMUNITY)
Admission: RE | Disposition: A | Discharge status: Home / Self Care | Payer: Self-pay | Source: Home / Self Care | Attending: Cardiology

## 2019-10-03 ENCOUNTER — Other Ambulatory Visit: Payer: Self-pay

## 2019-10-03 DIAGNOSIS — I25118 Atherosclerotic heart disease of native coronary artery with other forms of angina pectoris: Secondary | ICD-10-CM | POA: Diagnosis not present

## 2019-10-03 DIAGNOSIS — Z7982 Long term (current) use of aspirin: Secondary | ICD-10-CM | POA: Insufficient documentation

## 2019-10-03 DIAGNOSIS — R001 Bradycardia, unspecified: Secondary | ICD-10-CM | POA: Insufficient documentation

## 2019-10-03 DIAGNOSIS — I10 Essential (primary) hypertension: Secondary | ICD-10-CM | POA: Insufficient documentation

## 2019-10-03 DIAGNOSIS — Z86718 Personal history of other venous thrombosis and embolism: Secondary | ICD-10-CM | POA: Diagnosis not present

## 2019-10-03 DIAGNOSIS — Z955 Presence of coronary angioplasty implant and graft: Secondary | ICD-10-CM | POA: Insufficient documentation

## 2019-10-03 DIAGNOSIS — R0609 Other forms of dyspnea: Secondary | ICD-10-CM | POA: Insufficient documentation

## 2019-10-03 DIAGNOSIS — R05 Cough: Secondary | ICD-10-CM | POA: Insufficient documentation

## 2019-10-03 DIAGNOSIS — I2510 Atherosclerotic heart disease of native coronary artery without angina pectoris: Secondary | ICD-10-CM | POA: Diagnosis present

## 2019-10-03 DIAGNOSIS — Z79899 Other long term (current) drug therapy: Secondary | ICD-10-CM | POA: Diagnosis not present

## 2019-10-03 DIAGNOSIS — E785 Hyperlipidemia, unspecified: Secondary | ICD-10-CM | POA: Diagnosis not present

## 2019-10-03 DIAGNOSIS — Z7902 Long term (current) use of antithrombotics/antiplatelets: Secondary | ICD-10-CM | POA: Diagnosis not present

## 2019-10-03 HISTORY — PX: CORONARY STENT INTERVENTION: CATH118234

## 2019-10-03 HISTORY — PX: LEFT HEART CATH AND CORONARY ANGIOGRAPHY: CATH118249

## 2019-10-03 LAB — CBC
HCT: 39.7 % (ref 39.0–52.0)
Hemoglobin: 12.6 g/dL — ABNORMAL LOW (ref 13.0–17.0)
MCH: 30.2 pg (ref 26.0–34.0)
MCHC: 31.7 g/dL (ref 30.0–36.0)
MCV: 95.2 fL (ref 80.0–100.0)
Platelets: 123 10*3/uL — ABNORMAL LOW (ref 150–400)
RBC: 4.17 MIL/uL — ABNORMAL LOW (ref 4.22–5.81)
RDW: 13.3 % (ref 11.5–15.5)
WBC: 3.8 10*3/uL — ABNORMAL LOW (ref 4.0–10.5)
nRBC: 0 % (ref 0.0–0.2)

## 2019-10-03 LAB — BASIC METABOLIC PANEL
Anion gap: 7 (ref 5–15)
BUN: 15 mg/dL (ref 8–23)
CO2: 25 mmol/L (ref 22–32)
Calcium: 8.9 mg/dL (ref 8.9–10.3)
Chloride: 106 mmol/L (ref 98–111)
Creatinine, Ser: 1.09 mg/dL (ref 0.61–1.24)
GFR calc Af Amer: >60 mL/min (ref >60)
GFR calc non Af Amer: >60 mL/min (ref >60)
Glucose, Bld: 110 mg/dL — ABNORMAL HIGH (ref 70–99)
Potassium: 4.1 mmol/L (ref 3.5–5.1)
Sodium: 138 mmol/L (ref 135–145)

## 2019-10-03 LAB — POCT ACTIVATED CLOTTING TIME
Activated Clotting Time: 274 seconds
Activated Clotting Time: 274 seconds
Activated Clotting Time: 274 seconds

## 2019-10-03 SURGERY — LEFT HEART CATH AND CORONARY ANGIOGRAPHY
Anesthesia: LOCAL

## 2019-10-03 MED ORDER — HEPARIN SODIUM (PORCINE) 1000 UNIT/ML IJ SOLN
INTRAMUSCULAR | Status: AC
  Filled 2019-10-03: qty 1

## 2019-10-03 MED ORDER — SODIUM CHLORIDE 0.9% FLUSH: 3.0000 mL | Freq: Two times a day (BID) | INTRAVENOUS | Status: DC

## 2019-10-03 MED ORDER — VERAPAMIL HCL 2.5 MG/ML IV SOLN
INTRAVENOUS | Status: AC
  Filled 2019-10-03: qty 2

## 2019-10-03 MED ORDER — HEPARIN (PORCINE) IN NACL 1000-0.9 UT/500ML-% IV SOLN
INTRAVENOUS | Status: AC
  Filled 2019-10-03: qty 500

## 2019-10-03 MED ORDER — NITROGLYCERIN 1 MG/10 ML FOR IR/CATH LAB
INTRA_ARTERIAL | Status: DC | PRN
  Administered 2019-10-03 (×2): 200 ug via INTRACORONARY

## 2019-10-03 MED ORDER — NITROGLYCERIN 1 MG/10 ML FOR IR/CATH LAB
INTRA_ARTERIAL | Status: AC
  Filled 2019-10-03: qty 10

## 2019-10-03 MED ORDER — SODIUM CHLORIDE 0.9 % IV SOLN: 250.0000 mL | INTRAVENOUS | Status: DC | PRN

## 2019-10-03 MED ORDER — SODIUM CHLORIDE 0.9 % IV SOLN: INTRAVENOUS | Status: AC

## 2019-10-03 MED ORDER — FENTANYL CITRATE (PF) 100 MCG/2ML IJ SOLN
INTRAMUSCULAR | Status: AC
  Filled 2019-10-03: qty 2

## 2019-10-03 MED ORDER — FENTANYL CITRATE (PF) 100 MCG/2ML IJ SOLN
INTRAMUSCULAR | Status: DC | PRN
  Administered 2019-10-03: 25 ug via INTRAVENOUS

## 2019-10-03 MED ORDER — ONDANSETRON HCL 4 MG/2ML IJ SOLN: 4.0000 mg | Freq: Four times a day (QID) | INTRAMUSCULAR | Status: DC | PRN

## 2019-10-03 MED ORDER — ACETAMINOPHEN 325 MG PO TABS: 650.0000 mg | ORAL_TABLET | ORAL | Status: DC | PRN

## 2019-10-03 MED ORDER — ASPIRIN 81 MG PO CHEW
81.0000 mg | CHEWABLE_TABLET | ORAL | Status: AC
  Administered 2019-10-03: 81 mg via ORAL
  Filled 2019-10-03: qty 1

## 2019-10-03 MED ORDER — HEPARIN SODIUM (PORCINE) 1000 UNIT/ML IJ SOLN
INTRAMUSCULAR | Status: DC | PRN
  Administered 2019-10-03: 4000 [IU] via INTRAVENOUS
  Administered 2019-10-03: 6000 [IU] via INTRAVENOUS
  Administered 2019-10-03: 2000 [IU] via INTRAVENOUS
  Administered 2019-10-03: 4000 [IU] via INTRAVENOUS

## 2019-10-03 MED ORDER — SODIUM CHLORIDE 0.9% FLUSH: 3.0000 mL | INTRAVENOUS | Status: DC | PRN

## 2019-10-03 MED ORDER — MIDAZOLAM HCL 2 MG/2ML IJ SOLN
INTRAMUSCULAR | Status: DC | PRN
  Administered 2019-10-03: 1 mg via INTRAVENOUS

## 2019-10-03 MED ORDER — CLOPIDOGREL BISULFATE 75 MG PO TABS
ORAL_TABLET | ORAL | Status: AC
  Administered 2019-10-03: 75 mg via ORAL
  Filled 2019-10-03: qty 1

## 2019-10-03 MED ORDER — IOHEXOL 350 MG/ML SOLN
INTRAVENOUS | Status: DC | PRN
  Administered 2019-10-03: 130 mL

## 2019-10-03 MED ORDER — CLOPIDOGREL BISULFATE 75 MG PO TABS: 75.0000 mg | ORAL_TABLET | Freq: Every day | ORAL | Status: DC

## 2019-10-03 MED ORDER — SODIUM CHLORIDE 0.9 % WEIGHT BASED INFUSION
3.0000 mL/kg/h | INTRAVENOUS | Status: AC
  Administered 2019-10-03: 3 mL/kg/h via INTRAVENOUS

## 2019-10-03 MED ORDER — LIDOCAINE HCL (PF) 1 % IJ SOLN
INTRAMUSCULAR | Status: DC | PRN
  Administered 2019-10-03: 2 mL

## 2019-10-03 MED ORDER — HEPARIN (PORCINE) IN NACL 1000-0.9 UT/500ML-% IV SOLN
INTRAVENOUS | Status: DC | PRN
  Administered 2019-10-03 (×2): 500 mL

## 2019-10-03 MED ORDER — SODIUM CHLORIDE 0.9 % WEIGHT BASED INFUSION: 1.0000 mL/kg/h | INTRAVENOUS | Status: DC

## 2019-10-03 MED ORDER — LIDOCAINE HCL (PF) 1 % IJ SOLN
INTRAMUSCULAR | Status: AC
  Filled 2019-10-03: qty 30

## 2019-10-03 MED ORDER — VERAPAMIL HCL 2.5 MG/ML IV SOLN
INTRAVENOUS | Status: DC | PRN
  Administered 2019-10-03: 10 mL via INTRA_ARTERIAL

## 2019-10-03 MED ORDER — HYDRALAZINE HCL 20 MG/ML IJ SOLN: 10.0000 mg | INTRAMUSCULAR | Status: DC | PRN

## 2019-10-03 MED ORDER — LABETALOL HCL 5 MG/ML IV SOLN: 10.0000 mg | INTRAVENOUS | Status: DC | PRN

## 2019-10-03 MED ORDER — MIDAZOLAM HCL 2 MG/2ML IJ SOLN
INTRAMUSCULAR | Status: AC
  Filled 2019-10-03: qty 2

## 2019-10-03 SURGICAL SUPPLY — 20 items
BALLN SAPPHIRE 2.0X15 (BALLOONS) ×2
BALLN WOLVERINE 2.00X10 (BALLOONS) ×2
BALLN WOLVERINE 2.25X6 (BALLOONS) ×2
BALLOON SAPPHIRE 2.0X15 (BALLOONS) ×1 IMPLANT
BALLOON WOLVERINE 2.00X10 (BALLOONS) ×1 IMPLANT
BALLOON WOLVERINE 2.25X6 (BALLOONS) ×1 IMPLANT
CATH 5FR JL3.5 JR4 ANG PIG MP (CATHETERS) ×2 IMPLANT
CATH LAUNCHER 6FR JR4 (CATHETERS) ×2 IMPLANT
GLIDESHEATH SLEND A-KIT 6F 22G (SHEATH) ×2 IMPLANT
GUIDEWIRE INQWIRE 1.5J.035X260 (WIRE) ×1 IMPLANT
INQWIRE 1.5J .035X260CM (WIRE) ×2
KIT ENCORE 26 ADVANTAGE (KITS) ×2 IMPLANT
KIT HEART LEFT (KITS) ×2 IMPLANT
KIT HEMO VALVE WATCHDOG (MISCELLANEOUS) ×2 IMPLANT
PACK CARDIAC CATHETERIZATION (CUSTOM PROCEDURE TRAY) ×2 IMPLANT
STENT RESOLUTE ONYX 2.25X18 (Permanent Stent) ×2 IMPLANT
TRANSDUCER W/STOPCOCK (MISCELLANEOUS) ×2 IMPLANT
TUBING CIL FLEX 10 FLL-RA (TUBING) ×2 IMPLANT
WIRE ASAHI PROWATER 180CM (WIRE) ×2 IMPLANT
WIRE COUGAR XT STRL 190CM (WIRE) ×2 IMPLANT

## 2019-10-03 NOTE — Progress Notes (Signed)
Patient and wife was given discharge instructions. Both verbalized understanding. 

## 2019-10-03 NOTE — Interval H&P Note (Signed)
History and Physical Interval Note:  10/03/2019 1:22 PM  Willie Dominguez  has presented today for surgery, with the diagnosis of hp.  The various methods of treatment have been discussed with the patient and family. After consideration of risks, benefits and other options for treatment, the patient has consented to  Procedure(s): LEFT HEART CATH AND CORONARY ANGIOGRAPHY (N/A) as a surgical intervention.  The patient's history has been reviewed, patient examined, no change in status, stable for surgery.  I have reviewed the patient's chart and labs.  Questions were answered to the patient's satisfaction.    2016/2017 Appropriate Use Criteria for Coronary Revascularization Clinical Presentation: Diabetes Mellitus? Symptom Status? S/P CABG? Antianginal Therapy (# of long-acting drugs)? Results of Non-invasive testing? FFR/iFR results in all diseased vessels? Patient undergoing renal transplant? Patient undergoing percutaneous valve procedure (TAVR, MitraClip, Others)? Symptom Status:  Ischemic Symptoms  Non-invasive Testing:  Intermediate Risk  If no or indeterminate stress test, FFR/iFR results in all diseased vessels:  N/A  Diabetes Mellitus:  No  S/P CABG:  No  Antianginal therapy (number of long-acting drugs):  >=2  Patient undergoing renal transplant:  No  Patient undergoing percutaneous valve procedure:  No    newline 1 Vessel Disease PCI CABG  No proximal LAD involvement, No proximal left dominant LCX involvement A (8); Indication 2 M (6); Indication 2   Proximal left dominant LCX involvement A (8); Indication 5 A (8); Indication 5   Proximal LAD involvement A (8); Indication 5 A (8); Indication 5   newline 2 Vessel Disease  No proximal LAD involvement A (8); Indication 8 A (7); Indication 8   Proximal LAD involvement A (8); Indication 11 A (8); Indication 11   newline 3 Vessel Disease  Low disease complexity (e.g., focal stenoses, SYNTAX <=22) A (8); Indication 17 A (8);  Indication 17   Intermediate or high disease complexity (e.g., SYNTAX >=23) M (6); Indication 21 A (9); Indication 21   newline Left Main Disease  Isolated LMCA disease: ostial or midshaft A (7); Indication 24 A (9); Indication 24   Isolated LMCA disease: bifurcation involvement M (6); Indication 25 A (9); Indication 25   LMCA ostial or midshaft, concurrent low disease burden multivessel disease (e.g., 1-2 additional focal stenoses, SYNTAX <=22) A (7); Indication 26 A (9); Indication 26   LMCA ostial or midshaft, concurrent intermediate or high disease burden multivessel disease (e.g., 1-2 additional bifurcation stenoses, long stenoses, SYNTAX >=23) M (4); Indication 27 A (9); Indication 27   LMCA bifurcation involvement, concurrent low disease burden multivessel disease (e.g., 1-2 additional focal stenoses, SYNTAX <=22) M (6); Indication 28 A (9); Indication 28   LMCA bifurcation involvement, concurrent intermediate or high disease burden multivessel disease (e.g., 1-2 additional bifurcation stenoses, long stenoses, SYNTAX >=23) R (3); Indication 29 A (9); Indication [place]

## 2019-10-03 NOTE — Discharge Instructions (Signed)
Drink plenty of fluid for 48 hours and keep wrist elevated at heart level for 24 hours  Radial Site Care   This sheet gives you information about how to care for yourself after your procedure. Your health care provider may also give you more specific instructions. If you have problems or questions, contact your health care provider. What can I expect after the procedure? After the procedure, it is common to have:  Bruising and tenderness at the catheter insertion area. Follow these instructions at home: Medicines  Take over-the-counter and prescription medicines only as told by your health care provider. Insertion site care 1. Follow instructions from your health care provider about how to take care of your insertion site. Make sure you: ? Wash your hands with soap and water before you change your bandage (dressing). If soap and water are not available, use hand sanitizer. ? remove your dressing as told by your health care provider. In 24 hours 2. Check your insertion site every day for signs of infection. Check for: ? Redness, swelling, or pain. ? Fluid or blood. ? Pus or a bad smell. ? Warmth. 3. Do not take baths, swim, or use a hot tub until your health care provider approves. 4. You may shower 24-48 hours after the procedure, or as directed by your health care provider. ? Remove the dressing and gently wash the site with plain soap and water. ? Pat the area dry with a clean towel. ? Do not rub the site. That could cause bleeding. 5. Do not apply powder or lotion to the site. Activity   1. For 24 hours after the procedure, or as directed by your health care provider: ? Do not flex or bend the affected arm. ? Do not push or pull heavy objects with the affected arm. ? Do not drive yourself home from the hospital or clinic. You may drive 24 hours after the procedure unless your health care provider tells you not to. ? Do not operate machinery or power tools. 2. Do not lift  anything that is heavier than 10 lb (4.5 kg), or the limit that you are told, until your health care provider says that it is safe. For 4 days 3. Ask your health care provider when it is okay to: ? Return to work or school. ? Resume usual physical activities or sports. ? Resume sexual activity. General instructions  If the catheter site starts to bleed, raise your arm and put firm pressure on the site. If the bleeding does not stop, get help right away. This is a medical emergency.  If you went home on the same day as your procedure, a responsible adult should be with you for the first 24 hours after you arrive home.  Keep all follow-up visits as told by your health care provider. This is important. Contact a health care provider if:  You have a fever.  You have redness, swelling, or yellow drainage around your insertion site. Get help right away if:  You have unusual pain at the radial site.  The catheter insertion area swells very fast.  The insertion area is bleeding, and the bleeding does not stop when you hold steady pressure on the area.  Your arm or hand becomes pale, cool, tingly, or numb. These symptoms may represent a serious problem that is an emergency. Do not wait to see if the symptoms will go away. Get medical help right away. Call your local emergency services (911 in the [place] Do not   drive yourself to the hospital. Summary  After the procedure, it is common to have bruising and tenderness at the site.  Follow instructions from your health care provider about how to take care of your radial site wound. Check the wound every day for signs of infection.  Do not lift anything that is heavier than 10 lb (4.5 kg), or the limit that you are told, until your health care provider says that it is safe. This information is not intended to replace advice given to you by your health care provider. Make sure you discuss any questions you have with your health care  provider. Document Revised: 03/24/2017 Document Reviewed: 03/24/2017 Elsevier Patient Education  2020 [employer]  

## 2019-10-03 NOTE — H&P (Addendum)
OV 09/18/2019 copied for documentation     Subjective:   Willie Dominguez, male    DOB: Nov 06, 1950, 69 y.o.   MRN: [id number]  C/C: Chest pain, shortness of breath  HPI  69 year old Caucasian male with hypertension, hyperlipidemia, history of bilateral lower extremity DVT and PE in 01/2018, family h/o CAD,with exertional dyspnea.  Patient originally saw me in September 2020 for symptoms of shortness of breath.  His symptoms seem to have worsened since his PE in December 2019.  However, on further review, the symptoms were present even prior to that.  He never had any significant chest pain symptoms.  Patient's subsequent CT angiogram in January 2020 did not show any PE. Work-up with echocardiogram did not show any pulmonary hypertension.  Stress test was equivocal.  I had initially referred him to [facility] pulmonology, but it appears that he never heard from them.  In the meantime, given his worsening symptoms all persistent shortness of breath, angina equivalent remains in the differential.  Thus, I recommended right and left heart catheterization and pulmonary angiography.    Cath showed no evidence of pulmonary hypertension, but showed severe small PDA bifurcation stenosis, severe stenosis and small caliber ramus, and dFR positive LAD stenosis.  He underwent successful LAD stenting with post PCI DFR showing residual nonsignificant disease distally.  Given the amount of contrast used, I decided to stage.  Intervention in the future, as clinically necessary.   04/2019: Patient had not had any improvement in her shortness of breath symptoms.  Matter-of-fact, shortness of breath has worsened in the past 1 week.  He shortness of breath is present at both rest and exertion.  He also complains of dry cough, which is new.  He does not have any fever, chills, or other infectious symptoms.  He has had occasional "twinge" in his chest that lasts for a few seconds, but also has a separate chest pressure  that is constant 24X7.  Patient was seen by [facility] pulmonology in [facility] (04/2019) . His pulmonary function test indicated restrictive pattern. He was started on Spiriva. He was also seen by Rheumatology (05/2019) for ANA speckled pattern 1:320. Further workup is ongoing.   05/2019: In last four weeks, he has had one episode of chest pressure while working in the yard. His episodes of shortness of breath, cough continue. He has noticed some leg swelling.   06/2019: Since starting Spiriva, his shortness of breath and cough have had slight improvement. Shortness of breath is more prominent on walking up the hill or walking upstairs. He has not had any recurrence of chest pressure sometimes. His blood pressure is running higher than usual. He has been off Valsartan as were trying to elucidate etiology of his cough.   Today, he tells me that his dentist Dr. Glorious Peach has recommend third molar extraction, which has been delayed for a long time. He also needs colon polyp removal. His gastroenterologist Dr. Mallie Mussel had indicated to me that this can wait till September 2021, if he has ongoing cardiac issues.   08/2019: He has continued to have exertional dyspnea. Pulmonary management has not helped much, nor has cardiac rehab.   Colon polyp removal is currently scheduled for October/November 2021.   No current facility-administered medications on file prior to encounter.   Current Outpatient Medications on File Prior to Encounter  Medication Sig Dispense Refill  . acetaminophen (TYLENOL) 500 MG tablet Take 500-1,000 mg by mouth every 6 (six) hours as needed (for pain.).    [name]  aspirin EC 81 MG tablet Take 81 mg by mouth daily.    . clopidogrel (PLAVIX) 75 MG tablet TAKE 1 TABLET BY MOUTH EVERY DAY (Patient taking differently: Take 75 mg by mouth daily. ) 30 tablet 1  . Coenzyme Q10 (COQ10) 50 MG CAPS Take 50 mg by mouth daily.    [name] ibuprofen (ADVIL) 200 MG tablet Take 200 mg by mouth every 8  (eight) hours as needed (pain.).     [name] nitroGLYCERIN (NITROSTAT) 0.4 MG SL tablet Place 1 tablet (0.4 mg total) under the tongue every 5 (five) minutes as needed for chest pain. (Patient taking differently: Place 0.4 mg under the tongue every 5 (five) minutes x 3 doses as needed for chest pain. ) 30 tablet 3  . Omega-3 Fatty Acids (FISH OIL) 1000 MG CAPS Take 1,000 mg by mouth daily.    . rosuvastatin (CRESTOR) 20 MG tablet Take 1 tablet (20 mg total) by mouth daily. Please disregard another order for `0 mg from earlier today (Patient taking differently: Take 20 mg by mouth daily. ) 90 tablet 3  . Tiotropium Bromide Monohydrate (SPIRIVA RESPIMAT) 2.5 MCG/ACT AERS Inhale 1 puff into the lungs daily.     . valsartan (DIOVAN) 80 MG tablet Take 80 mg by mouth daily.    . vitamin B-12 (CYANOCOBALAMIN) 1000 MCG tablet Take 1,000 mcg by mouth daily.      Cardiovascular studies:  CTA chest 04/28/2019: 1.  No demonstrable pulmonary embolus. 2. Prominence of the ascending thoracic aorta with a maximum transverse diameter of 4.2 x 4.2 cm. Recommend annual imaging followup by CTA or MRA. This recommendation follows 2010 ACCF/AHA/AATS/ACR/ASA/SCA/SCAI/SIR/STS/SVM Guidelines for the Diagnosis and Management of Patients with Thoracic Aortic Disease. Circulation. 2010; 121: V956-L875. Aortic aneurysm NOS (ICD10-I71.9). No dissection. 3. Evidence of occasional calcified granulomas. No edema or airspace opacity. Areas of slight atelectatic change. 4.  No demonstrable adenopathy.  EKG 04/27/2019: Sinus rhythm 58 bpm.  LHC/RHC/coronary intervention 04/18/2019: LM: Normal LAD: Prox-mid 80% stenosis and LAD/D1 bifurcation (1,0,0). Moderate disease in prox Diag.         LAD dFR 0.87        Successful percutaneous coronary intervention prox LAD        PTCA and stent placement 2.75 X 12 mm Resolute Onyx drug-eluting stent        Post dilatation with 3.0X8 mm [place] balloon           Post PCI LAD dFR 0.95 RI: Prox  80% disease. Small caliber vessel. LCx: No significant stenosis. RCA: Distal 40%, PDA bifurcation 90% stenosis in small caliber vessel.  Right heart cath: RA: 5 mmHg RV: 27/6 mmHg PA: 26/14 mmHg, mean PA 19 mmHg PCW: 15 mmHg  If residual dyspnea symptoms, will stage PDA bifurcation stenosis stenting in future.  Lexiscan Myoview Stress Test 12/26/2018: Stress EKG is non-diagnostic, as this is pharmacological stress test. Although the t.i.d. index was normal at 0.95, left ventricle appeared to be mildly dilated in stress images with LV end-diastolic volume of 643 mL. The perfusion images reveals normal perfusion, however gated images reveal suggestion of mild mid anterior and septal hypokinesis.  LVEF was at the lower limit of normal at 50%. Intermediate risk study.  Clinical correlation recommended. No previous exam available for comparison.  Echocardiogram 12/02/2018: Left ventricle cavity is normal in size. Mild concentric hypertrophy of the left ventricle. Normal LV systolic function with EF 55%. Normal global wall motion. Normal diastolic filling pattern.  The aortic root is  mildly dilated at 4.1 cm. Left atrial cavity is normal in size. Aneurysmal interatrial septum without 2D or color Doppler evidence of interatrial shunt. Right ventricle cavity is mildly dilated. Normal right ventricular function. Trileaflet aortic valve. Trace aortic regurgitation. Mild (Grade I) mitral regurgitation. Mild tricuspid regurgitation. Estimated pulmonary artery systolic pressure is 23 mmHg. No significant change compared to previous study on 02/17/2018.   EKG 11/24/2018: Sinus rhythm 66 bpm. Left ventricular hypertrophy.   LE venous duplex US 03/2018: Left: Findings consistent with age indeterminate deep vein thrombosis involving the left popliteal vein, and left peroneal vein. No cystic structure found in the popliteal fossa. Compared to previous study, femoral vein and gastroc vein thrombus has    Resolved.  LE venous duplex US 01/2018: Right: Findings consistent with acute deep vein thrombosis involving the right peroneal vein. Left: Findings consistent with acute deep vein thrombosis involving the left proximal femoral vein, left gastrocnemius vein, left popliteal vein, left posterior tibial vein, and left peroneal vein.   Echocardiogram 01/2018: Normal-sized LV.  EF 60-65%.  Normal diastolic function. Mildly dilated ascending aorta 4.2 cm. No significant valvular abnormality. Mildly dilated RV with normal systolic function.  RVSP 27 mmHg.  Nuclear stress test 09/2016:  The left ventricular ejection fraction is mildly decreased (45-54%).  Nuclear stress EF: 52%.  Blood pressure demonstrated a blunted response to exercise.  There was no ST segment deviation noted during stress.  Defect 1: There is a small defect of moderate severity present in the apex location.  This is a low risk study.   Low risk stress nuclear study with very mild apical ischemia; EF 52 (low normal to mild global reduction in LV function); mild LVE.  Recent labs: 07/14/2019: Glucose 96, BUN/Cr 12/1.23. EGFR 60. Na/K 139/4.5. Rest of the CMP normal Chol 106, TG 75, HDL 44, LDL 46  10/10/2018: Glucose 107, BUN/Cr 14/1.12. EGFR 65. Na/K 140/4.4. Rest of the CMP normal H/H 14/42. MCV 92. Platelets 152 Chol 192, TG 131, HDL 44, LDL 123  Review of Systems  Review of Systems  Cardiovascular: Positive for dyspnea on exertion and leg swelling. Negative for chest pain, palpitations and syncope.  Respiratory: Positive for cough (Dry) and shortness of breath.         Vitals pending   Objective:   Physical Exam  Physical Exam Vitals and nursing note reviewed.  Constitutional:      General: He is not in acute distress. Neck:     Vascular: No JVD.  Cardiovascular:     Rate and Rhythm: Normal rate and regular rhythm.     Heart sounds: Normal heart sounds. No murmur heard.   Pulmonary:      Effort: Pulmonary effort is normal.     Breath sounds: Normal breath sounds. No wheezing or rales.        Assessment & Recommendations:   69 year old Caucasian male with hypertension, hyperlipidemia, history of bilateral lower extremity DVT and PE (01/2018), CAD, persistent dyspnea and new cough.  Exertional dyspnea, cough: While the patient has coronary artery disease  (s/p mid LAD PCI 04/2019, residual severe disease in small caliber PDA and ramus, mod residual disease in mid to distal LAD), he had no improvement in shortness of breath with LAD PCI.  However, with no other etiology identified for his dyspnea, he would like to revisit residual CAD as the etiology. He requests if he could repeat coronary angiography and consider revascularization.   Risks/benefits/expectations discussed with the patient. Will plan for 8/2.  He is aware that colon polyp may need to be postponed by another 6 months after possible revascularization.   Continue aspirin, Plavix, high intensity statin, amlodipine. Not on beta-blocker due to resting bradycardia.  Hypertension: Well controlled.   F/u after cath  Nigel Mormon, MD [facility] [place] Pager: [phone] Office: [phone] If no answer Cell [phone]

## 2019-10-04 ENCOUNTER — Telehealth (HOSPITAL_COMMUNITY): Payer: Self-pay | Admitting: *Deleted

## 2019-10-04 ENCOUNTER — Encounter (HOSPITAL_COMMUNITY): Payer: Self-pay | Admitting: Cardiology

## 2019-10-16 ENCOUNTER — Ambulatory Visit: Payer: 59 | Admitting: Cardiology

## 2019-10-16 ENCOUNTER — Encounter: Payer: Self-pay | Admitting: Cardiology

## 2019-10-16 ENCOUNTER — Other Ambulatory Visit: Payer: Self-pay

## 2019-10-16 VITALS — BP 105/70 | HR 62 | Resp 17 | Ht 70.0 in | Wt 238.0 lb

## 2019-10-16 DIAGNOSIS — I10 Essential (primary) hypertension: Secondary | ICD-10-CM

## 2019-10-16 DIAGNOSIS — I25118 Atherosclerotic heart disease of native coronary artery with other forms of angina pectoris: Secondary | ICD-10-CM

## 2019-10-16 DIAGNOSIS — R0609 Other forms of dyspnea: Secondary | ICD-10-CM

## 2019-10-16 NOTE — Progress Notes (Signed)
Patient referred by [name], [name], MD for exertional chest pain and dyspnea  Subjective:   Willie Dominguez, male    DOB: [birth year], 69 y.o.   MRN: [id number]   Chief Complaint  Patient presents with  . Hypertension  . Coronary Artery Disease  . Follow-up    HPI  69 year old Caucasian male with hypertension, hyperlipidemia, history of bilateral lower extremity DVT and PE in 01/2018, family h/o CAD,with exertional dyspnea.  10/2019:  Patient underwent successful intervention to right PDA on 10/03/2019. Unfortunately, in spite of successful LAD and RPDA PCI's, his dyspnea symptoms have not improved. He had a few sharp chest pain episodes lasting for 1-2 seconds after RPDA PCI, which completely resolved. He has since had two COVID vaccines, which made him sick He has recovered from that as well. However, his exertional dyspnea seems to have gotten worse. He denies any exertional chest pain.   Prior history: Patient originally saw me in September 2020 for symptoms of shortness of breath.  His symptoms seem to have worsened since his PE in December 2019.  However, on further review, the symptoms were present even prior to that.  He never had any significant chest pain symptoms.  Patient's subsequent CT angiogram in January 2020 did not show any PE. Work-up with echocardiogram did not show any pulmonary hypertension.  Stress test was equivocal.  I had initially referred him to [facility] pulmonology, but it appears that he never heard from them.  In the meantime, given his worsening symptoms all persistent shortness of breath, angina equivalent remains in the differential.  Thus, I recommended right and left heart catheterization and pulmonary angiography.    Cath showed no evidence of pulmonary hypertension, but showed severe small PDA bifurcation stenosis, severe stenosis and small caliber ramus, and dFR positive LAD stenosis.  He underwent successful LAD stenting with post PCI DFR showing  residual nonsignificant disease distally.  Given the amount of contrast used, I decided to stage.  Intervention in the future, as clinically necessary.  04/2019: Patient had not had any improvement in her shortness of breath symptoms.  Matter-of-fact, shortness of breath has worsened in the past 1 week.  He shortness of breath is present at both rest and exertion.  He also complains of dry cough, which is new.  He does not have any fever, chills, or other infectious symptoms.  He has had occasional "twinge" in his chest that lasts for a few seconds, but also has a separate chest pressure that is constant 24X7.  Patient was seen by [facility] pulmonology in [facility] (04/2019) . His pulmonary function test indicated restrictive pattern. He was started on Spiriva. He was also seen by Rheumatology (05/2019) for ANA speckled pattern 1:320. Further workup is ongoing.   05/2019: In last four weeks, he has had one episode of chest pressure while working in the yard. His episodes of shortness of breath, cough continue. He has noticed some leg swelling.   06/2019: Since starting Spiriva, his shortness of breath and cough have had slight improvement. Shortness of breath is more prominent on walking up the hill or walking upstairs. He has not had any recurrence of chest pressure sometimes. His blood pressure is running higher than usual. He has been off Valsartan as were trying to elucidate etiology of his cough.   Today, he tells me that his dentist Dr. Glorious Peach has recommend third molar extraction, which has been delayed for a long time. He also needs colon polyp  removal. His gastroenterologist Dr. Mallie Mussel had indicated to me that this can wait till September 2021, if he has ongoing cardiac issues.    Current Outpatient Medications on File Prior to Visit  Medication Sig Dispense Refill  . acetaminophen (TYLENOL) 500 MG tablet Take 500-1,000 mg by mouth every 6 (six) hours as needed (for pain.).    [name]  aspirin EC 81 MG tablet Take 81 mg by mouth daily.    . clopidogrel (PLAVIX) 75 MG tablet TAKE 1 TABLET BY MOUTH EVERY DAY (Patient taking differently: Take 75 mg by mouth daily. ) 30 tablet 1  . Coenzyme Q10 (COQ10) 50 MG CAPS Take 50 mg by mouth daily.    [name] ibuprofen (ADVIL) 200 MG tablet Take 200 mg by mouth every 8 (eight) hours as needed (pain.).     [name] nitroGLYCERIN (NITROSTAT) 0.4 MG SL tablet Place 1 tablet (0.4 mg total) under the tongue every 5 (five) minutes as needed for chest pain. (Patient taking differently: Place 0.4 mg under the tongue every 5 (five) minutes x 3 doses as needed for chest pain. ) 30 tablet 3  . Omega-3 Fatty Acids (FISH OIL) 1000 MG CAPS Take 1,000 mg by mouth daily.    . rosuvastatin (CRESTOR) 20 MG tablet Take 1 tablet (20 mg total) by mouth daily. Please disregard another order for `0 mg from earlier today (Patient taking differently: Take 20 mg by mouth daily. ) 90 tablet 3  . Tiotropium Bromide Monohydrate (SPIRIVA RESPIMAT) 2.5 MCG/ACT AERS Inhale 1 puff into the lungs daily.     . valsartan (DIOVAN) 80 MG tablet Take 80 mg by mouth daily.    . vitamin B-12 (CYANOCOBALAMIN) 1000 MCG tablet Take 1,000 mcg by mouth daily.     No current facility-administered medications on file prior to visit.    Cardiovascular studies:  EKG 10/16/2019: Sinus rhythm 59 bpm Normal EKG  Coronary intervention 10/03/2019: LM: Normal LAD: Patent prox LAD stent.        Mid 40% diffuse disease.        Post PCI dFR in 04/2019 was 0,95 RI: Prox 80% disease. Small caliber vessel. LCx: No significant stenosis. RCA: RPDA main branch with 90% stenosis. Another septal branch with diffuse 70% disease in small caliber vessel.        Successful cutting balloon PTCA and stent placement 2.25 X 18 mm Resolute Onyx drug-eluting stent  CTA chest 04/28/2019: 1.  No demonstrable pulmonary embolus. 2. Prominence of the ascending thoracic aorta with a maximum transverse diameter of 4.2 x 4.2 cm.  Recommend annual imaging followup by CTA or MRA. This recommendation follows 2010 ACCF/AHA/AATS/ACR/ASA/SCA/SCAI/SIR/STS/SVM Guidelines for the Diagnosis and Management of Patients with Thoracic Aortic Disease. Circulation. 2010; 121: S063-K160. Aortic aneurysm NOS (ICD10-I71.9). No dissection. 3. Evidence of occasional calcified granulomas. No edema or airspace opacity. Areas of slight atelectatic change. 4.  No demonstrable adenopathy.  EKG 04/27/2019: Sinus rhythm 58 bpm.  LHC/RHC/coronary intervention 04/18/2019: LM: Normal LAD: Prox-mid 80% stenosis and LAD/D1 bifurcation (1,0,0). Moderate disease in prox Diag.         LAD dFR 0.87        Successful percutaneous coronary intervention prox LAD        PTCA and stent placement 2.75 X 12 mm Resolute Onyx drug-eluting stent        Post dilatation with 3.0X8 mm [place] balloon           Post PCI LAD dFR 0.95 RI: Prox 80% disease. Small caliber  vessel. LCx: No significant stenosis. RCA: Distal 40%, PDA bifurcation 90% stenosis in small caliber vessel.  Right heart cath: RA: 5 mmHg RV: 27/6 mmHg PA: 26/14 mmHg, mean PA 19 mmHg PCW: 15 mmHg  If residual dyspnea symptoms, will stage PDA bifurcation stenosis stenting in future.  Lexiscan Myoview Stress Test 12/26/2018: Stress EKG is non-diagnostic, as this is pharmacological stress test. Although the t.i.d. index was normal at 0.95, left ventricle appeared to be mildly dilated in stress images with LV end-diastolic volume of 967 mL. The perfusion images reveals normal perfusion, however gated images reveal suggestion of mild mid anterior and septal hypokinesis.  LVEF was at the lower limit of normal at 50%. Intermediate risk study.  Clinical correlation recommended. No previous exam available for comparison.  Echocardiogram 12/02/2018: Left ventricle cavity is normal in size. Mild concentric hypertrophy of the left ventricle. Normal LV systolic function with EF 55%. Normal global wall  motion. Normal diastolic filling pattern.  The aortic root is mildly dilated at 4.1 cm. Left atrial cavity is normal in size. Aneurysmal interatrial septum without 2D or color Doppler evidence of interatrial shunt. Right ventricle cavity is mildly dilated. Normal right ventricular function. Trileaflet aortic valve. Trace aortic regurgitation. Mild (Grade I) mitral regurgitation. Mild tricuspid regurgitation. Estimated pulmonary artery systolic pressure is 23 mmHg. No significant change compared to previous study on 02/17/2018.   EKG 11/24/2018: Sinus rhythm 66 bpm. Left ventricular hypertrophy.   LE venous duplex US 03/2018: Left: Findings consistent with age indeterminate deep vein thrombosis involving the left popliteal vein, and left peroneal vein. No cystic structure found in the popliteal fossa. Compared to previous study, femoral vein and gastroc vein thrombus has  Resolved.  LE venous duplex US 01/2018: Right: Findings consistent with acute deep vein thrombosis involving the right peroneal vein. Left: Findings consistent with acute deep vein thrombosis involving the left proximal femoral vein, left gastrocnemius vein, left popliteal vein, left posterior tibial vein, and left peroneal vein.   Echocardiogram 01/2018: Normal-sized LV.  EF 60-65%.  Normal diastolic function. Mildly dilated ascending aorta 4.2 cm. No significant valvular abnormality. Mildly dilated RV with normal systolic function.  RVSP 27 mmHg.  Nuclear stress test 09/2016:  The left ventricular ejection fraction is mildly decreased (45-54%).  Nuclear stress EF: 52%.  Blood pressure demonstrated a blunted response to exercise.  There was no ST segment deviation noted during stress.  Defect 1: There is a small defect of moderate severity present in the apex location.  This is a low risk study.   Low risk stress nuclear study with very mild apical ischemia; EF 52 (low normal to mild global reduction in LV  function); mild LVE.    Recent labs: 10/10/2018: Glucose 107, BUN/Cr 14/1.12. EGFR 65. Na/K 140/4.4. Rest of the CMP normal H/H 14/42. MCV 92. Platelets 152 Chol 192, TG 131, HDL 44, LDL 123     Review of Systems  Review of Systems  Cardiovascular: Positive for dyspnea on exertion and leg swelling. Negative for chest pain, palpitations and syncope.  Respiratory: Positive for cough (Dry) and shortness of breath.           Vitals:   10/16/19 1546  BP: 105/70  Pulse: 62  Resp: 17  SpO2: 98%     Body mass index is 34.15 kg/m. Filed Weights   10/16/19 1546  Weight: 238 lb (108 kg)     Objective:   Physical Exam  Not performed. Telephone visit.     Assessment &  Recommendations:   69 year old Caucasian male with hypertension, hyperlipidemia, history of bilateral lower extremity DVT and PE (01/2018), CAD, persistent dyspnea and new cough.  Exertional dyspnea: Unfortunately his dyspnea has persisted in spute of successful prox LAD and RPDA PCI. He has residual disease in small caliber ramus. I do not believe any further PCI to this vessel will improve his dyspnea symptoms. At this point, I have to say that his dyspnea ia unlikely to be of cardiac origin. Consider alternate etiology, most likely obesity and deconditioning.   CAD: Conitnue DAPT till 04/2019. However, plavix can be held for colonoscopy, anytime after Nov 2021. LDL well controlled, BP well controlled.  Patient would like to see me back in 6 months  [place], MD [facility]. [place] Pager: [phone] Office: [phone] If no answer Cell [phone]

## 2019-10-19 ENCOUNTER — Telehealth (HOSPITAL_COMMUNITY): Payer: Self-pay

## 2019-10-19 ENCOUNTER — Encounter (HOSPITAL_COMMUNITY): Payer: Self-pay

## 2019-10-19 NOTE — Telephone Encounter (Signed)
Attempted to call patient in regards to Cardiac Rehab - LM on VM Mailed letter 

## 2019-11-01 ENCOUNTER — Telehealth (HOSPITAL_COMMUNITY): Payer: Self-pay

## 2019-11-01 NOTE — Telephone Encounter (Signed)
No response from pt regarding CR.  Closed referral.  

## 2019-11-14 ENCOUNTER — Other Ambulatory Visit: Payer: Self-pay | Admitting: Cardiology

## 2020-01-05 ENCOUNTER — Ambulatory Visit: Payer: 59 | Admitting: Gastroenterology

## 2020-01-05 ENCOUNTER — Other Ambulatory Visit (INDEPENDENT_AMBULATORY_CARE_PROVIDER_SITE_OTHER): Payer: 59

## 2020-01-05 ENCOUNTER — Encounter: Payer: Self-pay | Admitting: Gastroenterology

## 2020-01-05 VITALS — BP 130/80 | HR 62 | Ht 71.5 in | Wt 244.0 lb

## 2020-01-05 DIAGNOSIS — K635 Polyp of colon: Secondary | ICD-10-CM

## 2020-01-05 DIAGNOSIS — Z7902 Long term (current) use of antithrombotics/antiplatelets: Secondary | ICD-10-CM

## 2020-01-05 DIAGNOSIS — Z86010 Personal history of colonic polyps: Secondary | ICD-10-CM | POA: Diagnosis not present

## 2020-01-05 DIAGNOSIS — R933 Abnormal findings on diagnostic imaging of other parts of digestive tract: Secondary | ICD-10-CM | POA: Diagnosis not present

## 2020-01-05 LAB — BASIC METABOLIC PANEL
BUN: 12 mg/dL (ref 6–23)
CO2: 27 mEq/L (ref 19–32)
Calcium: 9.2 mg/dL (ref 8.4–10.5)
Chloride: 105 mEq/L (ref 96–112)
Creatinine, Ser: 1.06 mg/dL (ref 0.40–1.50)
GFR: 71.88 mL/min (ref >60.00)
Glucose, Bld: 102 mg/dL — ABNORMAL HIGH (ref 70–99)
Potassium: 4.6 mEq/L (ref 3.5–5.1)
Sodium: 139 mEq/L (ref 135–145)

## 2020-01-05 LAB — PROTIME-INR
INR: 1 ratio (ref 0.8–1.0)
Prothrombin Time: 11.5 s (ref 9.6–13.1)

## 2020-01-05 LAB — CBC
HCT: 41.9 % (ref 39.0–52.0)
Hemoglobin: 14.1 g/dL (ref 13.0–17.0)
MCHC: 33.6 g/dL (ref 30.0–36.0)
MCV: 90.9 fl (ref 78.0–100.0)
Platelets: 125 10*3/uL — ABNORMAL LOW (ref 150.0–400.0)
RBC: 4.61 Mil/uL (ref 4.22–5.81)
RDW: 13.6 % (ref 11.5–15.5)
WBC: 5.2 10*3/uL (ref 4.0–10.5)

## 2020-01-05 MED ORDER — SUPREP BOWEL PREP KIT 17.5-3.13-1.6 GM/177ML PO SOLN
1.0000 | ORAL | 0 refills | Status: DC
Start: 2020-01-05 — End: 2020-02-29
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
## 2020-01-05 NOTE — Patient Instructions (Addendum)
We have sent the following medications to your pharmacy for you to pick up at your convenience: Suprep   START MIRALAX 1 [place].   You will be contaced by our office prior to your procedure for directions on holding your Plavix.  If you do not hear from our office 1 week prior to your scheduled procedure, please call [phone] to discuss.   Your provider has r equested that you go to the basement level for lab work before leaving today. Press "B" on the elevator. The lab is located at the first door on the left as you exit the elevat or.  If you are age 45 or older, your body mass index should be between 23-30. Your Body mass index is 33.56 kg/m. If this is out of the aforementioned range listed, please consider follow up with your Primary Care Provider.  If you are age 48 or younger, your body mass index should be between 19-25. Your Body mass index is 33.56 kg/m. If this is out of the aformentioned range listed, please consider follow up with your Primary Care Provider.    You have been scheduled for a colonoscopy. Please follow written instructions given to you at your visit today.  Please pick up your prep supplies at the pharmacy within the next 1-3 days. If you use inhalers (even only as needed), please bring them with you on the day of your procedure.   Due to recent changes in healthcare laws, you may see the results of your imaging and laboratory studies on MyChart before your provider has had a chance to review them.  We understand that in some cases there may be results that are confusing or concerning to you. Not all laboratory results come back in the same time frame and the provider may be waiting for multiple results in order to interpret others.  Please give [place] 48 hours in order for your provider to thoroughly review all the results before contacting the office for clarification of your results.   Thank you for choosing me  and [facility].  Dr. Rush Landmark

## 2020-01-06 ENCOUNTER — Encounter: Payer: Self-pay | Admitting: Gastroenterology

## 2020-01-06 DIAGNOSIS — Z860100 Personal history of colon polyps, unspecified: Secondary | ICD-10-CM | POA: Insufficient documentation

## 2020-01-06 DIAGNOSIS — K635 Polyp of colon: Secondary | ICD-10-CM | POA: Insufficient documentation

## 2020-01-06 DIAGNOSIS — Z7902 Long term (current) use of antithrombotics/antiplatelets: Secondary | ICD-10-CM | POA: Insufficient documentation

## 2020-01-06 DIAGNOSIS — R933 Abnormal findings on diagnostic imaging of other parts of digestive tract: Secondary | ICD-10-CM | POA: Insufficient documentation

## 2020-01-09 ENCOUNTER — Encounter: Payer: Self-pay | Admitting: Cardiology

## 2020-01-10 ENCOUNTER — Other Ambulatory Visit: Payer: Self-pay | Admitting: Cardiology

## 2020-01-24 ENCOUNTER — Telehealth: Payer: Self-pay

## 2020-01-24 NOTE — Telephone Encounter (Signed)
Per Dr. Virgina Jock okay for pt to hold Plavix x5 days prior to his procedure. Pt has been informed and voiced understanding.

## 2020-02-26 ENCOUNTER — Other Ambulatory Visit (HOSPITAL_COMMUNITY)
Admission: RE | Admit: 2020-02-26 | Discharge: 2020-02-26 | Disposition: A | Discharge status: Home / Self Care | Payer: 59 | Source: Ambulatory Visit | Attending: Gastroenterology | Admitting: Gastroenterology

## 2020-02-26 DIAGNOSIS — Z20822 Contact with and (suspected) exposure to covid-19: Secondary | ICD-10-CM | POA: Insufficient documentation

## 2020-02-26 DIAGNOSIS — Z01812 Encounter for preprocedural laboratory examination: Secondary | ICD-10-CM | POA: Insufficient documentation

## 2020-02-27 ENCOUNTER — Other Ambulatory Visit: Payer: Self-pay

## 2020-02-27 ENCOUNTER — Encounter (HOSPITAL_COMMUNITY): Payer: Self-pay | Admitting: Gastroenterology

## 2020-02-27 LAB — SARS CORONAVIRUS 2 (TAT 6-24 HRS): SARS Coronavirus 2: NEGATIVE

## 2020-02-27 NOTE — Progress Notes (Signed)
Mr. Willie Dominguez denies chest pain or shortness of breath.  Mr. Willie Dominguez  Tested negative for Covid 02/26/20 and has been in quarantine with family.   I instructed Mr Willie Dominguez to hold vitamins and herbal medications.  Mr. Willie Dominguez states that he understands prep and has not questions.   Mr. Willie Dominguez reported that that he was instructed to hold Asa and Plavix for 5 days prior to procedure, with permission of his cardioogist.

## 2020-02-29 ENCOUNTER — Ambulatory Visit (HOSPITAL_COMMUNITY): Payer: 59

## 2020-02-29 ENCOUNTER — Encounter (HOSPITAL_COMMUNITY): Payer: Self-pay | Admitting: Gastroenterology

## 2020-02-29 ENCOUNTER — Other Ambulatory Visit: Payer: Self-pay

## 2020-02-29 ENCOUNTER — Ambulatory Visit (HOSPITAL_COMMUNITY): Payer: 59 | Admitting: Anesthesiology

## 2020-02-29 ENCOUNTER — Ambulatory Visit (HOSPITAL_COMMUNITY)
Admission: RE | Admit: 2020-02-29 | Discharge: 2020-02-29 | Disposition: A | Discharge status: Home / Self Care | Payer: 59 | Attending: Gastroenterology | Admitting: Gastroenterology

## 2020-02-29 ENCOUNTER — Encounter (HOSPITAL_COMMUNITY)
Admission: RE | Disposition: A | Discharge status: Home / Self Care | Payer: Self-pay | Source: Home / Self Care | Attending: Gastroenterology

## 2020-02-29 DIAGNOSIS — Z888 Allergy status to other drugs, medicaments and biological substances status: Secondary | ICD-10-CM | POA: Diagnosis not present

## 2020-02-29 DIAGNOSIS — Z9889 Other specified postprocedural states: Secondary | ICD-10-CM | POA: Insufficient documentation

## 2020-02-29 DIAGNOSIS — D120 Benign neoplasm of cecum: Secondary | ICD-10-CM | POA: Diagnosis not present

## 2020-02-29 DIAGNOSIS — K641 Second degree hemorrhoids: Secondary | ICD-10-CM | POA: Insufficient documentation

## 2020-02-29 DIAGNOSIS — R933 Abnormal findings on diagnostic imaging of other parts of digestive tract: Secondary | ICD-10-CM

## 2020-02-29 DIAGNOSIS — K644 Residual hemorrhoidal skin tags: Secondary | ICD-10-CM | POA: Insufficient documentation

## 2020-02-29 DIAGNOSIS — Q438 Other specified congenital malformations of intestine: Secondary | ICD-10-CM | POA: Diagnosis not present

## 2020-02-29 DIAGNOSIS — Z955 Presence of coronary angioplasty implant and graft: Secondary | ICD-10-CM | POA: Insufficient documentation

## 2020-02-29 DIAGNOSIS — Z87442 Personal history of urinary calculi: Secondary | ICD-10-CM | POA: Diagnosis not present

## 2020-02-29 DIAGNOSIS — Z880 Allergy status to penicillin: Secondary | ICD-10-CM | POA: Diagnosis not present

## 2020-02-29 DIAGNOSIS — H5461 Unqualified visual loss, right eye, normal vision left eye: Secondary | ICD-10-CM | POA: Insufficient documentation

## 2020-02-29 DIAGNOSIS — Z86718 Personal history of other venous thrombosis and embolism: Secondary | ICD-10-CM | POA: Diagnosis not present

## 2020-02-29 DIAGNOSIS — Z86010 Personal history of colonic polyps: Secondary | ICD-10-CM

## 2020-02-29 DIAGNOSIS — R109 Unspecified abdominal pain: Secondary | ICD-10-CM

## 2020-02-29 DIAGNOSIS — K5730 Diverticulosis of large intestine without perforation or abscess without bleeding: Secondary | ICD-10-CM | POA: Diagnosis not present

## 2020-02-29 DIAGNOSIS — K635 Polyp of colon: Secondary | ICD-10-CM

## 2020-02-29 HISTORY — PX: BIOPSY: SHX5522

## 2020-02-29 HISTORY — PX: ENDOROTOR MORCELLATION: SHX6860

## 2020-02-29 HISTORY — PX: HEMOSTASIS CLIP PLACEMENT: SHX6857

## 2020-02-29 HISTORY — PX: COLONOSCOPY WITH PROPOFOL: SHX5780

## 2020-02-29 HISTORY — DX: Personal history of urinary calculi: Z87.442

## 2020-02-29 HISTORY — PX: SCLEROTHERAPY: SHX6841

## 2020-02-29 SURGERY — COLONOSCOPY WITH PROPOFOL
Anesthesia: Monitor Anesthesia Care

## 2020-02-29 MED ORDER — SODIUM CHLORIDE 0.9 % IV SOLN: INTRAVENOUS | Status: DC

## 2020-02-29 MED ORDER — FENTANYL CITRATE (PF) 100 MCG/2ML IJ SOLN
INTRAMUSCULAR | Status: AC
  Filled 2020-02-29: qty 2

## 2020-02-29 MED ORDER — EPINEPHRINE 1 MG/10ML IJ SOSY
PREFILLED_SYRINGE | INTRAMUSCULAR | Status: AC
  Filled 2020-02-29: qty 10

## 2020-02-29 MED ORDER — CLOPIDOGREL BISULFATE 75 MG PO TABS: 75.0000 mg | ORAL_TABLET | Freq: Every day | ORAL | Status: DC

## 2020-02-29 MED ORDER — FENTANYL CITRATE (PF) 100 MCG/2ML IJ SOLN
25.0000 ug | Freq: Once | INTRAMUSCULAR | Status: AC
Start: 2020-02-29 — End: 2020-02-29
  Administered 2020-02-29: 11:00:00 25 ug via INTRAVENOUS

## 2020-02-29 MED ORDER — PROPOFOL 500 MG/50ML IV EMUL
INTRAVENOUS | Status: DC | PRN
  Administered 2020-02-29 (×3): 100 ug/kg/min via INTRAVENOUS

## 2020-02-29 MED ORDER — FENTANYL CITRATE (PF) 100 MCG/2ML IJ SOLN
25.0000 ug | Freq: Once | INTRAMUSCULAR | Status: AC
  Administered 2020-02-29: 12:00:00 25 ug via INTRAVENOUS

## 2020-02-29 MED ORDER — PROPOFOL 10 MG/ML IV BOLUS
INTRAVENOUS | Status: DC | PRN
  Administered 2020-02-29: 20 mg via INTRAVENOUS
  Administered 2020-02-29: 40 mg via INTRAVENOUS

## 2020-02-29 MED ORDER — LACTATED RINGERS IV SOLN: INTRAVENOUS | Status: DC | PRN

## 2020-02-29 MED ORDER — SODIUM CHLORIDE (PF) 0.9 % IJ SOLN
PREFILLED_SYRINGE | INTRAMUSCULAR | Status: DC | PRN
  Administered 2020-02-29: 10:00:00 1 mL

## 2020-02-29 SURGICAL SUPPLY — 21 items

## 2020-02-29 NOTE — Progress Notes (Signed)
Patient evaluated post procedure. Discomfort noted with TTP but no rebound. 6/10 pain. Fentanyl administered. CXR/KUB ordered.  Re-evaluated after XRays complete and discomfort down to 4/10. Fentanyl administered. Pending imaging results.  Results returned and re-evaluated. Patient discomfort 3-4/10 but needs to urinate and get up and move without passage of gas.  Long discussion with patient and wife and have agreed to discharge with close monitoring over next 24-36 hours. If worsening pain then will call and knows that will need to come into ED for potential CTAP. If bleeding occurs they know to call.  Based on clinical stability and patient status at this time, it is felt low likelihood of perforation (though not impossible) and OK for discharge with close monitoring of symptoms.  They are appreciative for our help with this polyp and hopefully it will be gone at time of recall.  Corliss Parish, MD  [facility]enter[facility] Advanced Endoscopy Office # [phone]

## 2020-02-29 NOTE — Progress Notes (Signed)
Patient ambulated to bathroom with assistance, tolerated walk well, was able to void in bathroom, patient stated, "I feel much better after going to the bathroom", patient began getting dressed, wife was called and discharge instructions reviewed. Pt to call the office if any problems arise.  Hermina Barters, RN
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
## 2020-02-29 NOTE — Op Note (Signed)
[facility] Patient Name: Willie Dominguez Procedure Date : 02/29/2020 MRN: [id number] Attending MD: Justice Britain , MD Date of Birth: 1950/08/10 CSN: [id number] Age: 69 Admit Type: Outpatient Procedure:                Colonoscopy Indications:              Excision of colonic polyp Providers:                Justice Britain, MD, Jeanella Cara, RN,                            Kary Kos RN, RN, Cletis Athens, Technician Referring MD:             Estill Cotta. Loletha Carrow, MD, [name]. [name] Medicines:                Monitored Anesthesia Care Complications:            No immediate complications. Estimated Blood Loss:     Estimated blood loss was minimal. Procedure:                Pre-Anesthesia Assessment:                           - Prior to the procedure, a History and Physical                            was performed, and patient medications and                            allergies were reviewed. The patient's tolerance of                            previous anesthesia was also reviewed. The risks                            and benefits of the procedure and the sedation                            options and risks were discussed with the patient.                            All questions were answered, and informed consent                            was obtained. Prior Anticoagulants: The patient has                            taken Plavix (clopidogrel), last dose was 5 days                            prior to procedure. ASA Grade Assessment: III - A                            patient with severe systemic disease. After  reviewing the risks and benefits, the patient was                            deemed in satisfactory condition to undergo the                            procedure.                           After obtaining informed consent, the colonoscope                            was passed under direct vision. Throughout the                             procedure, the patient's blood pressure, pulse, and                            oxygen saturations were monitored continuously. The                            CF-HQ190L ([id number]) Olympus colonoscope was                            introduced through the anus and advanced to the the                            cecum, identified by appendiceal orifice and                            ileocecal valve. The colonoscopy was technically                            difficult and complex due to a redundant colon and                            significant looping. Successful completion of the                            procedure was aided by changing the patient's                            position, using manual pressure, withdrawing and                            reinserting the scope, straightening and shortening                            the scope to obtain bowel loop reduction and using                            scope torsion. The patient tolerated the procedure. Scope In: 9:11:23 AM Scope Out: 10:35:16 AM Scope Withdrawal Time: 1 hour 17 minutes 59 seconds  Total Procedure Duration: 1 hour  23 minutes 53 seconds  Findings:      The digital rectal exam findings include hemorrhoids. Pertinent       negatives include no palpable rectal lesions.      A moderate amount of liquid semi-liquid stool was found in the entire       colon, interfering with visualization. Lavage of the area was performed       using copious amounts, resulting in clearance with adequate       visualization.      The colon (entire examined portion) revealed grossly excessive looping       with redundancy.      A 25 mm post mucosectomy scar was found in the cecum. There was residual       polypoid tissue present suggestive of recurrence. Preparations were made       for mucosal resection as there was significant fibrosis underneath the       lesion it was not clear that typical EMR techniques would be possible.        NBI imaging and White-light endoscopy was done to demarcate the borders       of the lesion. A 1:100,000 solution of epinephrine was injected       underneath the lesion to see if lift would occur (it did not) but also       to decrease risk of bleeding/oozing. A total of 4 cc of epinephrine was       injected. Decision made to proceed with attempt at [facility] Powered       Endoscopic Resection via Morcellation. The machine was setup and the       cathteter was inserted through the colonoscope. Morcellation was       performed. Resection and retrieval were complete. To prevent bleeding       post-maneuver (in setting of need for anti-PLT therapy), eight       hemostatic clips were successfully placed (MR conditional). There was no       bleeding at the end of the procedure.      Multiple small-mouthed diverticula were found in the recto-sigmoid       colon, sigmoid colon and descending colon.      Normal mucosa was found in the entire colon otherwise.      Non-bleeding non-thrombosed external and internal hemorrhoids were found       during retroflexion, during perianal exam and during digital exam. The       hemorrhoids were Grade II (internal hemorrhoids that prolapse but reduce       spontaneously). Impression:               - Hemorrhoids found on digital rectal exam.                           - Stool in the entire examined colon - lavaged with                            adequate visualization.                           - There was significant looping of the colon and                            redundancy of the colon.                           -  Post mucosectomy scar in the cecum with evidence                            of recurrence of tissue. Endorotor Powered                            Endoscopic Resection was performed. Resection and                            retrieval were complete. Clips (MR conditional)                            were placed to decrease risk of bleeding                             post-intervention.                           - Diverticulosis in the recto-sigmoid colon, in the                            sigmoid colon and in the descending colon.                           - Normal mucosa in the entire examined colon                            otherwise.                           - Non-bleeding non-thrombosed external and internal                            hemorrhoids. Recommendation:           - The patient will be observed post-procedure,                            until all discharge criteria are met.                           - Discharge patient to home.                           - Patient has a contact number available for                            emergencies. The signs and symptoms of potential                            delayed complications were discussed with the                            patient. Return to normal activities tomorrow.  Written discharge instructions were provided to the                            patient.                           - High fiber diet.                           - Use FiberCon 1-2 tablets PO daily.                           - May restart Plavix on 1/2 PM or 1/3 AM based on                            timing of taking medication daily to allow at least                            72 hours off Plavix to decrease risk of                            post-interventional bleeding.                           - May restart Aspirin tomorrow (12/31).                           - No ibuprofen, naproxen, or other non-steroidal                            anti-inflammatory drugs for 2 weeks after polyp                            removal to decrease risk of post-interventional                            bleeding.                           - Await pathology results.                           - Repeat colonoscopy in 6-9 months for surveillance                            and use of FTRD if  recurrence is noted.                           - The findings and recommendations were discussed                            with the patient.                           - The findings and recommendations were discussed  with the patient's family. Procedure Code(s):        --- Professional ---                           [phone], Colonoscopy, flexible; with endoscopic                            mucosal resection Diagnosis Code(s):        --- Professional ---                           K64.1, Second degree hemorrhoids                           Z98.890, Other specified postprocedural states                           K63.5, Polyp of colon                           K57.30, Diverticulosis of large intestine without                            perforation or abscess without bleeding CPT copyright 2019 American Medical Association. All rights reserved. The codes documented in this report are preliminary and upon coder review may  be revised to meet current compliance requirements. Justice Britain, MD 02/29/2020 10:59:29 AM Number of Addenda: 0

## 2020-02-29 NOTE — Anesthesia Preprocedure Evaluation (Addendum)
Anesthesia Evaluation  Patient identified by MRN, date of birth, ID band Patient awake    Reviewed: Allergy & Precautions, NPO status , Patient's Chart, lab work & pertinent test results  History of Anesthesia Complications (+) PROLONGED EMERGENCE and history of anesthetic complications  Airway Mallampati: I  TM Distance: >3 FB Neck ROM: Full    Dental  (+) Teeth Intact, Dental Advisory Given   Pulmonary PE (2019)   Pulmonary exam normal breath sounds clear to auscultation       Cardiovascular hypertension, Pt. on medications + CAD and + Cardiac Stents (04/2019, 10/2019; plavix last dose 12/24 )  Normal cardiovascular exam Rhythm:Regular Rate:Normal  Echo 2019: Left ventricle: The cavity size was normal. Systolic function was  normal. The estimated ejection fraction was in the range of 60%  to 65%. Wall motion was normal; there were no regional wall  motion abnormalities. Left ventricular diastolic function  parameters were normal.  - Aortic valve: Transvalvular velocity was within the normal range.  There was no stenosis. There was no regurgitation.  - Ascending aorta: The ascending aorta was mildly dilated. 42 mm in  the ascending aorta. This may be upper limit of normal when  indexed to body surface area.  - Mitral valve: There was no regurgitation.  - Left atrium: The atrium was normal in size.  - Right ventricle: The cavity size was mildly dilated. Wall  thickness was normal. Systolic function was normal. RV systolic  pressure (S, est): 27 mm Hg.  - Right atrium: The atrium was normal in size. Central venous  pressure (est): 3 mm Hg.  - Atrial septum: No defect or patent foramen ovale was identified.  - Tricuspid valve: There was trivial regurgitation.  - Pulmonic valve: There was trivial regurgitation.  - Pulmonary arteries: Systolic pressure was within the normal  range.  - Inferior vena cava:  The vessel was normal in size. The  respirophasic diameter changes were in the normal range (>= 50%),  consistent with normal central venous pressure.  - Pericardium, extracardiac: There was no pericardial effusion.    Neuro/Psych  Neuromuscular disease (peripheral neuropathy B/L feet) negative psych ROS   GI/Hepatic Neg liver ROS, Cecal polyp   Endo/Other  Obesity BMI 32  Renal/GU negative Renal ROS  negative genitourinary   Musculoskeletal  (+) Arthritis , Osteoarthritis,    Abdominal   Peds  Hematology negative hematology ROS (+)   Anesthesia Other Findings   Reproductive/Obstetrics negative OB ROS                            Anesthesia Physical Anesthesia Plan  ASA: III  Anesthesia Plan: MAC   Post-op Pain Management:    Induction:   PONV Risk Score and Plan: 2 and Propofol infusion and TIVA  Airway Management Planned: Natural Airway and Simple Face Mask  Additional Equipment: None  Intra-op Plan:   Post-operative Plan:   Informed Consent: I have reviewed the patients History and Physical, chart, labs and discussed the procedure including the risks, benefits and alternatives for the proposed anesthesia with the patient or authorized representative who has indicated his/her understanding and acceptance.       Plan Discussed with: CRNA  Anesthesia Plan Comments:         Anesthesia Quick Evaluation

## 2020-02-29 NOTE — Transfer of Care (Signed)
Immediate Anesthesia Transfer of Care Note  Patient: HANIEL FIX  Procedure(s) Performed: COLONOSCOPY WITH PROPOFOL (N/A ) ENDOSCOPIC MUCOSAL RESECTION (N/A ) SCLEROTHERAPY HEMOSTASIS CLIP PLACEMENT BIOPSY  Patient Location: PACU  Anesthesia Type:MAC  Level of Consciousness: drowsy and patient cooperative  Airway & Oxygen Therapy: Patient Spontanous Breathing and Patient connected to face mask oxygen  Post-op Assessment: Report given to RN and Post -op Vital signs reviewed and stable  Post vital signs: Reviewed and stable  Last Vitals:  Vitals Value Taken Time  BP 110/66 02/29/20 1045  Temp    Pulse 67 02/29/20 1047  Resp 21 02/29/20 1047  SpO2 100 % 02/29/20 1047  Vitals shown include unvalidated device data.  Last Pain:  Vitals:   02/29/20 0816  TempSrc: Oral  PainSc: 0-No pain         Complications: No complications documented.

## 2020-02-29 NOTE — Anesthesia Postprocedure Evaluation (Signed)
Anesthesia Post Note  Patient: Willie Dominguez  Procedure(s) Performed: COLONOSCOPY WITH PROPOFOL (N/A ) ENDOSCOPIC MUCOSAL RESECTION (N/A ) SCLEROTHERAPY HEMOSTASIS CLIP PLACEMENT BIOPSY     Patient location during evaluation: PACU Anesthesia Type: MAC Level of consciousness: awake and alert Pain management: pain level controlled Vital Signs Assessment: post-procedure vital signs reviewed and stable Respiratory status: spontaneous breathing, nonlabored ventilation and respiratory function stable Cardiovascular status: blood pressure returned to baseline and stable Postop Assessment: no apparent nausea or vomiting Anesthetic complications: no   No complications documented.  Last Vitals:  Vitals:   02/29/20 1045 02/29/20 1100  BP: 110/66 (!) 148/86  Pulse: (!) 54 (!) 52  Resp: 17 18  Temp: (!) 36.1 C   SpO2: 99% 99%    Last Pain:  Vitals:   02/29/20 1045  TempSrc:   PainSc: 0-No pain                 Pervis Hocking

## 2020-03-04 ENCOUNTER — Encounter: Payer: Self-pay | Admitting: Gastroenterology

## 2020-03-04 LAB — SURGICAL PATHOLOGY

## 2020-04-12 ENCOUNTER — Other Ambulatory Visit: Payer: Self-pay | Admitting: Family Medicine

## 2020-04-12 DIAGNOSIS — I10 Essential (primary) hypertension: Secondary | ICD-10-CM

## 2020-04-15 NOTE — Progress Notes (Signed)
Patient referred by [name], [name], MD for exertional chest pain and dyspnea  Subjective:   Willie Dominguez, male    DOB: 1950/10/01, 70 y.o.   MRN: [id number]   Chief Complaint  Patient presents with  . Coronary Artery Disease  . Follow-up    6 month    HPI  70 year old Caucasian male with hypertension, hyperlipidemia, history of bilateral lower extremity DVT and PE in 01/2018, family h/o CAD,with exertional dyspnea, mild thoracic aorta anuerysm  Patient continues to have exertional dyspnea.  It is worse when he is wearing mask, as well as when he is climbing up stairs.  Shortness of breath improves with rest.  He denies any chest pain.  He has seen pulmonology at [facility] before once, and does not have any scheduled follow-ups with them.   Prior history: Patient originally saw me in September 2020 for symptoms of shortness of breath.  His symptoms seem to have worsened since his PE in December 2019.  However, on further review, the symptoms were present even prior to that.  He never had any significant chest pain symptoms.  Patient's subsequent CT angiogram in January 2020 did not show any PE. Work-up with echocardiogram did not show any pulmonary hypertension.  Stress test was equivocal.  I had initially referred him to [facility] pulmonology, but it appears that he never heard from them.  In the meantime, given his worsening symptoms all persistent shortness of breath, angina equivalent remains in the differential.  Thus, I recommended right and left heart catheterization and pulmonary angiography.    Cath showed no evidence of pulmonary hypertension, but showed severe small PDA bifurcation stenosis, severe stenosis and small caliber ramus, and dFR positive LAD stenosis.  He underwent successful LAD stenting with post PCI DFR showing residual nonsignificant disease distally.  Given the amount of contrast used, I decided to stage.  Intervention in the future, as clinically  necessary.  04/2019: Patient had not had any improvement in her shortness of breath symptoms.  Matter-of-fact, shortness of breath has worsened in the past 1 week.  He shortness of breath is present at both rest and exertion.  He also complains of dry cough, which is new.  He does not have any fever, chills, or other infectious symptoms.  He has had occasional "twinge" in his chest that lasts for a few seconds, but also has a separate chest pressure that is constant 24X7.  Patient was seen by [facility] pulmonology in [facility] (04/2019) . His pulmonary function test indicated restrictive pattern. He was started on Spiriva. He was also seen by Rheumatology (05/2019) for ANA speckled pattern 1:320. Further workup is ongoing.   05/2019: In last four weeks, he has had one episode of chest pressure while working in the yard. His episodes of shortness of breath, cough continue. He has noticed some leg swelling.   06/2019: Since starting Spiriva, his shortness of breath and cough have had slight improvement. Shortness of breath is more prominent on walking up the hill or walking upstairs. He has not had any recurrence of chest pressure sometimes. His blood pressure is running higher than usual. He has been off Valsartan as were trying to elucidate etiology of his cough.   10/2019:  Patient underwent successful intervention to right PDA on 10/03/2019. Unfortunately, in spite of successful LAD and RPDA PCI's, his dyspnea symptoms have not improved. He had a few sharp chest pain episodes lasting for 1-2 seconds after RPDA PCI, which completely  resolved. He has since had two COVID vaccines, which made him sick He has recovered from that as well. However, his exertional dyspnea seems to have gotten worse. He denies any exertional chest pain.    Current Outpatient Medications on File Prior to Visit  Medication Sig Dispense Refill  . acetaminophen (TYLENOL) 500 MG tablet Take 500-1,000 mg by mouth every 6 (six)  hours as needed (for pain.).    [name] aspirin EC 81 MG tablet Take 81 mg by mouth daily.    . Cholecalciferol (VITAMIN D-3) 125 MCG (5000 UT) TABS Take 5,000 Units by mouth daily.    . clopidogrel (PLAVIX) 75 MG tablet Take 1 tablet (75 mg total) by mouth daily.    . Coenzyme Q10 (COQ10) 100 MG CAPS Take 100 mg by mouth daily.    . nitroGLYCERIN (NITROSTAT) 0.4 MG SL tablet Place 1 tablet (0.4 mg total) under the tongue every 5 (five) minutes as needed for chest pain. 30 tablet 3  . Omega-3 Fatty Acids (FISH OIL) 1000 MG CAPS Take 1,000 mg by mouth daily.    . rosuvastatin (CRESTOR) 20 MG tablet Take 1 tablet (20 mg total) by mouth daily. Please disregard another order for `0 mg from earlier today (Patient taking differently: Take 20 mg by mouth daily.) 90 tablet 3  . valsartan (DIOVAN) 80 MG tablet TAKE 1 TABLET BY MOUTH EVERY DAY 30 tablet 11  . vitamin B-12 (CYANOCOBALAMIN) 1000 MCG tablet Take 1,000 mcg by mouth daily.     No current facility-administered medications on file prior to visit.    Cardiovascular studies:  EKG 04/17/2020: Sinus rhythm 57 bpm Borderline left atrial enlargement Early repolarization changes  Coronary intervention 10/03/2019: LM: Normal LAD: Patent prox LAD stent.        Mid 40% diffuse disease.        Post PCI dFR in 04/2019 was 0.95 RI: Prox 80% disease. Small caliber vessel. LCx: No significant stenosis. RCA: RPDA main branch with 90% stenosis. Another septal branch with diffuse 70% disease in small caliber vessel.        Successful cutting balloon PTCA and stent placement 2.25 X 18 mm Resolute Onyx drug-eluting stent  CTA chest 04/28/2019: 1.  No demonstrable pulmonary embolus. 2. Prominence of the ascending thoracic aorta with a maximum transverse diameter of 4.2 x 4.2 cm. Recommend annual imaging followup by CTA or MRA. This recommendation follows 2010 ACCF/AHA/AATS/ACR/ASA/SCA/SCAI/SIR/STS/SVM Guidelines for the Diagnosis and Management of Patients with  Thoracic Aortic Disease. Circulation. 2010; 121: I951-O841. Aortic aneurysm NOS (ICD10-I71.9). No dissection. 3. Evidence of occasional calcified granulomas. No edema or airspace opacity. Areas of slight atelectatic change. 4.  No demonstrable adenopathy.  EKG 04/27/2019: Sinus rhythm 58 bpm.  LHC/RHC/coronary intervention 04/18/2019: LM: Normal LAD: Prox-mid 80% stenosis and LAD/D1 bifurcation (1,0,0). Moderate disease in prox Diag.         LAD dFR 0.87        Successful percutaneous coronary intervention prox LAD        PTCA and stent placement 2.75 X 12 mm Resolute Onyx drug-eluting stent        Post dilatation with 3.0X8 mm [place] balloon           Post PCI LAD dFR 0.95 RI: Prox 80% disease. Small caliber vessel. LCx: No significant stenosis. RCA: Distal 40%, PDA bifurcation 90% stenosis in small caliber vessel.  Right heart cath: RA: 5 mmHg RV: 27/6 mmHg PA: 26/14 mmHg, mean PA 19 mmHg PCW: 15 mmHg  If residual dyspnea symptoms, will stage PDA bifurcation stenosis stenting in future.  Lexiscan Myoview Stress Test 12/26/2018: Stress EKG is non-diagnostic, as this is pharmacological stress test. Although the t.i.d. index was normal at 0.95, left ventricle appeared to be mildly dilated in stress images with LV end-diastolic volume of 276 mL. The perfusion images reveals normal perfusion, however gated images reveal suggestion of mild mid anterior and septal hypokinesis.  LVEF was at the lower limit of normal at 50%. Intermediate risk study.  Clinical correlation recommended. No previous exam available for comparison.  Echocardiogram 12/02/2018: Left ventricle cavity is normal in size. Mild concentric hypertrophy of the left ventricle. Normal LV systolic function with EF 55%. Normal global wall motion. Normal diastolic filling pattern.  The aortic root is mildly dilated at 4.1 cm. Left atrial cavity is normal in size. Aneurysmal interatrial septum without 2D or color Doppler  evidence of interatrial shunt. Right ventricle cavity is mildly dilated. Normal right ventricular function. Trileaflet aortic valve. Trace aortic regurgitation. Mild (Grade I) mitral regurgitation. Mild tricuspid regurgitation. Estimated pulmonary artery systolic pressure is 23 mmHg. No significant change compared to previous study on 02/17/2018.   EKG 11/24/2018: Sinus rhythm 66 bpm. Left ventricular hypertrophy.   LE venous duplex US 03/2018: Left: Findings consistent with age indeterminate deep vein thrombosis involving the left popliteal vein, and left peroneal vein. No cystic structure found in the popliteal fossa. Compared to previous study, femoral vein and gastroc vein thrombus has  Resolved.  LE venous duplex US 01/2018: Right: Findings consistent with acute deep vein thrombosis involving the right peroneal vein. Left: Findings consistent with acute deep vein thrombosis involving the left proximal femoral vein, left gastrocnemius vein, left popliteal vein, left posterior tibial vein, and left peroneal vein.   Echocardiogram 01/2018: Normal-sized LV.  EF 60-65%.  Normal diastolic function. Mildly dilated ascending aorta 4.2 cm. No significant valvular abnormality. Mildly dilated RV with normal systolic function.  RVSP 27 mmHg.  Nuclear stress test 09/2016:  The left ventricular ejection fraction is mildly decreased (45-54%).  Nuclear stress EF: 52%.  Blood pressure demonstrated a blunted response to exercise.  There was no ST segment deviation noted during stress.  Defect 1: There is a small defect of moderate severity present in the apex location.  This is a low risk study.   Low risk stress nuclear study with very mild apical ischemia; EF 52 (low normal to mild global reduction in LV function); mild LVE.    Recent labs: 01/05/2020: Glucose 102, BUN/Cr 12/1.06. EGFR 71. Na/K 139/4.6. Rest of the CMP normal H/H 14/41. MCV 90. Platelets 125  05/2019: Chol 106, TG  75, HDL 44, LDL 46  10/10/2018: Glucose 107, BUN/Cr 14/1.12. EGFR 65. Na/K 140/4.4. Rest of the CMP normal H/H 14/42. MCV 92. Platelets 152    Review of Systems  Review of Systems  Cardiovascular: Positive for dyspnea on exertion. Negative for chest pain, leg swelling, palpitations and syncope.  Respiratory: Positive for shortness of breath. Negative for cough (Dry).           Vitals:   04/17/20 0914  BP: 140/76  Pulse: 64  Resp: 16  Temp: 98.2 F (36.8 C)  SpO2: 96%     Body mass index is 32.78 kg/m. Filed Weights   04/17/20 0914  Weight: 251 lb (113.9 kg)     Objective:  Physical Exam Vitals and nursing note reviewed.  Constitutional:      General: He is not in acute distress. Neck:  Vascular: No JVD.  Cardiovascular:     Rate and Rhythm: Normal rate and regular rhythm.     Heart sounds: Normal heart sounds. No murmur heard.   Pulmonary:     Effort: Pulmonary effort is normal.     Breath sounds: Normal breath sounds. No wheezing or rales.        Assessment & Recommendations:   70 year old Caucasian male with hypertension, hyperlipidemia, history of bilateral lower extremity DVT and PE (01/2018), CAD, persistent dyspnea and new cough.  Exertional dyspnea: Unfortunately his dyspnea has persisted in spute of successful prox LAD and RPDA PCI. He has residual disease in small caliber ramus. I do not believe any further PCI to this vessel will improve his dyspnea symptoms. At this point, I have to say that his dyspnea ia unlikely to be of cardiac origin. Consider alternate etiology, most likely obesity and deconditioning. I will refer him to [facility] pulmonology for second opinion. He has previously seen [facility] pulmonology. I will also refer him to pulmonary rehab.   CAD: Stop plavix. Continue Aspirin.  Lipids well controlled  Thoracic aorta aneurysm: 4.2 cm on CTA in 2021. Will repeat CTA now.   F/u in 6 months  Caitrin Pendergraph Esther Hardy,  MD [facility] [place] Pager: [phone] Office: [phone] If no answer Cell [phone]

## 2020-04-17 ENCOUNTER — Encounter: Payer: Self-pay | Admitting: Cardiology

## 2020-04-17 ENCOUNTER — Ambulatory Visit: Payer: 59 | Admitting: Cardiology

## 2020-04-17 ENCOUNTER — Other Ambulatory Visit: Payer: Self-pay

## 2020-04-17 VITALS — BP 140/76 | HR 64 | Temp 98.2°F | Resp 16 | Ht 71.0 in | Wt 251.0 lb

## 2020-04-17 DIAGNOSIS — I10 Essential (primary) hypertension: Secondary | ICD-10-CM

## 2020-04-17 DIAGNOSIS — I712 Thoracic aortic aneurysm, without rupture, unspecified: Secondary | ICD-10-CM

## 2020-04-17 DIAGNOSIS — I25118 Atherosclerotic heart disease of native coronary artery with other forms of angina pectoris: Secondary | ICD-10-CM

## 2020-04-17 DIAGNOSIS — Z86711 Personal history of pulmonary embolism: Secondary | ICD-10-CM

## 2020-04-17 DIAGNOSIS — R0600 Dyspnea, unspecified: Secondary | ICD-10-CM

## 2020-04-17 DIAGNOSIS — R0609 Other forms of dyspnea: Secondary | ICD-10-CM

## 2020-04-17 MED ORDER — ATENOLOL 25 MG PO TABS: 12.5000 mg | ORAL_TABLET | Freq: Every day | ORAL | 2 refills | Status: DC

## 2020-04-21 ENCOUNTER — Other Ambulatory Visit: Payer: Self-pay | Admitting: Cardiology

## 2020-04-25 ENCOUNTER — Encounter (HOSPITAL_COMMUNITY): Payer: Self-pay | Admitting: *Deleted

## 2020-04-25 NOTE — Progress Notes (Signed)
Received referral from Dr. Virgina Jock  for this pt to participate in pulmonary rehab with the the diagnosis of Exertional dyspnea. Pt is known to the rehab staff from his previous participation last year in Cardiac rehab.  Clinical review of pt follow up appt on 2/16 office note.  Pt with Covid Risk Score - 5. Pt appropriate for scheduling for Pulmonary rehab.  Will forward to support staff for scheduling and verification of insurance eligibility/benefits with pt consent. Cherre Huger, BSN Cardiac and Training and development officer

## 2020-04-29 ENCOUNTER — Other Ambulatory Visit (HOSPITAL_COMMUNITY): Payer: Self-pay | Admitting: Cardiology

## 2020-04-30 LAB — BASIC METABOLIC PANEL
BUN/Creatinine Ratio: 15 (ref 10–24)
BUN: 18 mg/dL (ref 8–27)
CO2: 21 mmol/L (ref 20–29)
Calcium: 9.3 mg/dL (ref 8.6–10.2)
Chloride: 104 mmol/L (ref 96–106)
Creatinine, Ser: 1.24 mg/dL (ref 0.76–1.27)
Glucose: 101 mg/dL — ABNORMAL HIGH (ref 65–99)
Potassium: 5 mmol/L (ref 3.5–5.2)
Sodium: 139 mmol/L (ref 134–144)
eGFR: 63 mL/min/{1.73_m2} (ref >59)

## 2020-05-16 ENCOUNTER — Telehealth (HOSPITAL_COMMUNITY): Payer: Self-pay

## 2020-05-16 ENCOUNTER — Encounter (HOSPITAL_COMMUNITY): Payer: Self-pay

## 2020-05-16 NOTE — Telephone Encounter (Signed)
Attempted to call patient in regards to Pulmonary Rehab - LM on VM Mailed letter 

## 2020-05-30 NOTE — Telephone Encounter (Signed)
No response from pt.  Closed referral  

## 2021-02-28 ENCOUNTER — Telehealth: Payer: Self-pay

## 2021-02-28 NOTE — Telephone Encounter (Signed)
---  Caller states he tested positive for Covid earlier today at home. His headache started on Wednesday. Body aches and runny nose started yesterday. Denies fever or any other symptoms. Caller is vaccinated against Covid.  02/28/2021 1:11:12 PM Call PCP within 24 Hours Doren Custard, RN, Caryl Pina  Comments User: Dorcas Carrow, RN Date/Time Eilene Ghazi Time): 02/28/2021 1:11:50 PM Caller states that he was told the office is closed until Tuesday for the holiday. Referrals GO TO FACILITY OTHER - SPECIFY  Thersa Salt- MD 02/28/2021 1:06:01 PM Spoke with On Call - General Message Result Provider advised for patient to go to [facility] for care today

## 2021-03-19 ENCOUNTER — Encounter (HOSPITAL_BASED_OUTPATIENT_CLINIC_OR_DEPARTMENT_OTHER): Payer: Self-pay

## 2021-03-19 ENCOUNTER — Other Ambulatory Visit: Payer: Self-pay

## 2021-03-19 ENCOUNTER — Emergency Department (HOSPITAL_BASED_OUTPATIENT_CLINIC_OR_DEPARTMENT_OTHER)
Admission: EM | Admit: 2021-03-19 | Discharge: 2021-03-20 | Disposition: A | Discharge status: Home / Self Care | Payer: 59 | Attending: Emergency Medicine | Admitting: Emergency Medicine

## 2021-03-19 ENCOUNTER — Emergency Department (HOSPITAL_BASED_OUTPATIENT_CLINIC_OR_DEPARTMENT_OTHER): Payer: 59 | Admitting: Radiology

## 2021-03-19 DIAGNOSIS — J029 Acute pharyngitis, unspecified: Secondary | ICD-10-CM

## 2021-03-19 DIAGNOSIS — U099 Chronic cough: Secondary | ICD-10-CM

## 2021-03-19 DIAGNOSIS — R053 Chronic cough: Secondary | ICD-10-CM

## 2021-03-19 DIAGNOSIS — Z7902 Long term (current) use of antithrombotics/antiplatelets: Secondary | ICD-10-CM | POA: Diagnosis not present

## 2021-03-19 DIAGNOSIS — U071 COVID-19: Secondary | ICD-10-CM | POA: Diagnosis not present

## 2021-03-19 DIAGNOSIS — K209 Esophagitis, unspecified without bleeding: Secondary | ICD-10-CM

## 2021-03-19 DIAGNOSIS — R0602 Shortness of breath: Secondary | ICD-10-CM | POA: Insufficient documentation

## 2021-03-19 DIAGNOSIS — J020 Streptococcal pharyngitis: Secondary | ICD-10-CM | POA: Diagnosis not present

## 2021-03-19 DIAGNOSIS — Z7982 Long term (current) use of aspirin: Secondary | ICD-10-CM | POA: Diagnosis not present

## 2021-03-19 LAB — CBC WITH DIFFERENTIAL/PLATELET
Abs Immature Granulocytes: 0.02 10*3/uL (ref 0.00–0.07)
Basophils Absolute: 0 10*3/uL (ref 0.0–0.1)
Basophils Relative: 0 %
Eosinophils Absolute: 0.1 10*3/uL (ref 0.0–0.5)
Eosinophils Relative: 2 %
HCT: 37.4 % — ABNORMAL LOW (ref 39.0–52.0)
Hemoglobin: 12 g/dL — ABNORMAL LOW (ref 13.0–17.0)
Immature Granulocytes: 0 %
Lymphocytes Relative: 19 %
Lymphs Abs: 1.1 10*3/uL (ref 0.7–4.0)
MCH: 30.5 pg (ref 26.0–34.0)
MCHC: 32.1 g/dL (ref 30.0–36.0)
MCV: 94.9 fL (ref 80.0–100.0)
Monocytes Absolute: 0.4 10*3/uL (ref 0.1–1.0)
Monocytes Relative: 8 %
Neutro Abs: 3.9 10*3/uL (ref 1.7–7.7)
Neutrophils Relative %: 71 %
Platelets: 131 10*3/uL — ABNORMAL LOW (ref 150–400)
RBC: 3.94 MIL/uL — ABNORMAL LOW (ref 4.22–5.81)
RDW: 14.1 % (ref 11.5–15.5)
WBC: 5.5 10*3/uL (ref 4.0–10.5)
nRBC: 0 % (ref 0.0–0.2)

## 2021-03-19 LAB — GROUP A STREP BY PCR: Group A Strep by PCR: NOT DETECTED

## 2021-03-19 LAB — BASIC METABOLIC PANEL
Anion gap: 6 (ref 5–15)
BUN: 21 mg/dL (ref 8–23)
CO2: 26 mmol/L (ref 22–32)
Calcium: 8.7 mg/dL — ABNORMAL LOW (ref 8.9–10.3)
Chloride: 104 mmol/L (ref 98–111)
Creatinine, Ser: 0.92 mg/dL (ref 0.61–1.24)
GFR, Estimated: >60 mL/min (ref >60)
Glucose, Bld: 115 mg/dL — ABNORMAL HIGH (ref 70–99)
Potassium: 4.2 mmol/L (ref 3.5–5.1)
Sodium: 136 mmol/L (ref 135–145)

## 2021-03-19 LAB — RESP PANEL BY RT-PCR (FLU A&B, COVID) ARPGX2
Influenza A by PCR: NEGATIVE
Influenza B by PCR: NEGATIVE
SARS Coronavirus 2 by RT PCR: POSITIVE — AB

## 2021-03-19 LAB — BRAIN NATRIURETIC PEPTIDE: B Natriuretic Peptide: 58.9 pg/mL (ref 0.0–100.0)

## 2021-03-19 MED ORDER — FENTANYL CITRATE PF 50 MCG/ML IJ SOSY
50.0000 ug | PREFILLED_SYRINGE | Freq: Once | INTRAMUSCULAR | Status: AC
Start: 2021-03-19 — End: 2021-03-19
  Administered 2021-03-19: 50 ug via INTRAVENOUS
  Filled 2021-03-19: qty 1

## 2021-03-19 MED ORDER — FLUCONAZOLE 150 MG PO TABS: 150.0000 mg | ORAL_TABLET | Freq: Once | ORAL | Status: DC

## 2021-03-19 MED ORDER — ACETAMINOPHEN-CODEINE #3 300-30 MG PO TABS: 1.0000 | ORAL_TABLET | Freq: Four times a day (QID) | ORAL | 0 refills | Status: DC | PRN

## 2021-03-19 MED ORDER — SUCRALFATE 1 G PO TABS: 1.0000 g | ORAL_TABLET | Freq: Three times a day (TID) | ORAL | 0 refills | Status: DC

## 2021-03-19 MED ORDER — ACETAMINOPHEN-CODEINE #3 300-30 MG PO TABS
2.0000 | ORAL_TABLET | Freq: Once | ORAL | Status: AC
  Administered 2021-03-19: 2 via ORAL
  Filled 2021-03-19: qty 2

## 2021-03-19 MED ORDER — FLUCONAZOLE 200 MG PO TABS: 200.0000 mg | ORAL_TABLET | Freq: Every day | ORAL | 0 refills | Status: AC

## 2021-03-19 MED ORDER — FLUCONAZOLE 200 MG PO TABS
400.0000 mg | ORAL_TABLET | Freq: Once | ORAL | Status: DC
Start: 2021-03-19 — End: 2021-03-19

## 2021-03-19 MED ORDER — ALBUTEROL SULFATE HFA 108 (90 BASE) MCG/ACT IN AERS
4.0000 | INHALATION_SPRAY | Freq: Once | RESPIRATORY_TRACT | Status: AC
  Administered 2021-03-19: 4 via RESPIRATORY_TRACT
  Filled 2021-03-19: qty 6.7

## 2021-03-19 MED ORDER — FLUCONAZOLE 150 MG PO TABS
400.0000 mg | ORAL_TABLET | Freq: Every day | ORAL | Status: DC
  Administered 2021-03-20: 375 mg via ORAL

## 2021-03-19 MED ORDER — LACTATED RINGERS IV BOLUS
1000.0000 mL | Freq: Once | INTRAVENOUS | Status: AC
  Administered 2021-03-19: 1000 mL via INTRAVENOUS

## 2021-03-19 MED ORDER — KETOROLAC TROMETHAMINE 30 MG/ML IJ SOLN
15.0000 mg | Freq: Once | INTRAMUSCULAR | Status: AC
  Administered 2021-03-19: 15 mg via INTRAVENOUS
  Filled 2021-03-19: qty 1

## 2021-03-19 NOTE — ED Provider Notes (Signed)
[facility] EMERGENCY DEPT Provider Note   CSN: [id number] Arrival date & time: 03/19/21  1921     History  Chief Complaint  Patient presents with   Sore Throat    NOSSON WENDER is a 71 y.o. male.  HPI     71 year old male comes in with chief complaint of sore throat. Patient decays that he was diagnosed with COVID-19 on December 30.  He was put on Paxlovid.  Few days later he went to urgent care, was started on antibiotic and steroids.  Initially symptoms improved, but then started getting worse about a week ago.  His home COVID-19 test was -2 days ago.  Patient has history of PE remotely.  Indicates that his primary complaint is cough and sore throat.  The pain is significant and has made swallowing more difficult.  His cough is worse at night and he feels like he is drowning when he is asleep.  His shortness of breath is exertional, getting up the stairs gets admitted now which was not the case pre-COVID.  He denies any fevers, chills.  P.o. intake has gone down because of pain.  Patient has gone to urgent care and was started on azithromycin but there has been no change.  Home Medications Prior to Admission medications   Medication Sig Start Date End Date Taking? Authorizing Provider  acetaminophen-codeine (TYLENOL #3) 300-30 MG tablet Take 1 tablet by mouth every 6 (six) hours as needed for moderate pain. 03/19/21  Yes Varney Biles, MD  fluconazole (DIFLUCAN) 200 MG tablet Take 1 tablet (200 mg total) by mouth daily for 7 days. 03/19/21 03/26/21 Yes Varney Biles, MD  sucralfate (CARAFATE) 1 g tablet Take 1 tablet (1 g total) by mouth 4 (four) times daily -  with meals and at bedtime. 03/19/21  Yes Varney Biles, MD  acetaminophen (TYLENOL) 500 MG tablet Take 500-1,000 mg by mouth every 6 (six) hours as needed (for pain.).    [provider]  aspirin EC 81 MG tablet Take 81 mg by mouth daily.    [provider]  atenolol (TENORMIN) 25  MG tablet Take 0.5 tablets (12.5 mg total) by mouth daily. 04/17/20 07/16/20  Patwardhan, Reynold Bowen, MD  Cholecalciferol (VITAMIN D-3) 125 MCG (5000 UT) TABS Take 5,000 Units by mouth daily.    [provider]  clopidogrel (PLAVIX) 75 MG tablet Take 1 tablet (75 mg total) by mouth daily. 03/03/20   Mansouraty, Telford Nab., MD  Coenzyme Q10 (COQ10) 100 MG CAPS Take 100 mg by mouth daily.    [provider]  nitroGLYCERIN (NITROSTAT) 0.4 MG SL tablet Place 1 tablet (0.4 mg total) under the tongue every 5 (five) minutes as needed for chest pain. 01/04/19 04/10/20  Patwardhan, Reynold Bowen, MD  Omega-3 Fatty Acids (FISH OIL) 1000 MG CAPS Take 1,000 mg by mouth daily.    [provider]  rosuvastatin (CRESTOR) 20 MG tablet TAKE 1 TABLET BY MOUTH DAILY. 04/22/20   Patwardhan, Reynold Bowen, MD  valsartan (DIOVAN) 80 MG tablet TAKE 1 TABLET BY MOUTH EVERY DAY 04/12/20   [name], [name], MD  vitamin B-12 (CYANOCOBALAMIN) 1000 MCG tablet Take 1,000 mcg by mouth daily.    [provider]      Allergies    Methotrexate derivatives, Doxazosin, Lipitor [atorvastatin], Lisinopril, Penicillins, Bystolic [nebivolol hcl], and Simvastatin    Review of Systems   Review of Systems  Constitutional:  Positive for activity change.  Respiratory:  Positive for cough and shortness  of breath.    Physical Exam Updated Vital Signs BP (!) 142/80    Pulse 69    Temp 98.8 F (37.1 C)    Resp 18    Ht 5\' 11"  (1.803 m)    Wt 113.9 kg    SpO2 97%    BMI 35.02 kg/m  Physical Exam Vitals and nursing note reviewed.  Constitutional:      Appearance: He is well-developed.  HENT:     Head: Atraumatic.     Mouth/Throat:     Mouth: Mucous membranes are moist.     Pharynx: No oropharyngeal exudate.     Tonsils: No tonsillar exudate or tonsillar abscesses.  Cardiovascular:     Rate and Rhythm: Normal rate.  Pulmonary:     Effort: Pulmonary effort is normal.     Breath sounds: No wheezing, rhonchi or rales.   Musculoskeletal:     Cervical back: Neck supple.  Skin:    General: Skin is warm.  Neurological:     Mental Status: He is alert and oriented to person, place, and time.    ED Results / Procedures / Treatments   Labs (all labs ordered are listed, but only abnormal results are displayed) Labs Reviewed  RESP PANEL BY RT-PCR (FLU A&B, COVID) ARPGX2 - Abnormal; Notable for the following components:      Result Value   SARS Coronavirus 2 by RT PCR POSITIVE (*)    All other components within normal limits  CBC WITH DIFFERENTIAL/PLATELET - Abnormal; Notable for the following components:   RBC 3.94 (*)    Hemoglobin 12.0 (*)    HCT 37.4 (*)    Platelets 131 (*)    All other components within normal limits  BASIC METABOLIC PANEL - Abnormal; Notable for the following components:   Glucose, Bld 115 (*)    Calcium 8.7 (*)    All other components within normal limits  GROUP A STREP BY PCR  BRAIN NATRIURETIC PEPTIDE    EKG None  Radiology DG Chest 2 View  Result Date: 03/19/2021 CLINICAL DATA:  Cough and history of prior COVID infection, initial encounter EXAM: CHEST - 2 VIEW COMPARISON:  02/29/2020 FINDINGS: Cardiac shadow is stable. The lungs are well aerated bilaterally. No focal infiltrate or effusion is seen. Degenerative changes of the thoracic spine are noted. Calcified granuloma is noted in the left upper lobe stable in appearance from the prior exam. IMPRESSION: No acute abnormality noted. Electronically Signed   By: Inez Catalina M.D.   On: 03/19/2021 19:56    Procedures Procedures    Medications Ordered in ED Medications  fluconazole (DIFLUCAN) tablet 375 mg (has no administration in time range)  lactated ringers bolus 1,000 mL (0 mLs Intravenous Stopped 03/19/21 2318)  albuterol (VENTOLIN HFA) 108 (90 Base) MCG/ACT inhaler 4 puff (4 puffs Inhalation Given 03/19/21 2203)  acetaminophen-codeine (TYLENOL #3) 300-30 MG per tablet 2 tablet (2 tablets Oral Given 03/19/21 2221)   fentaNYL (SUBLIMAZE) injection 50 mcg (50 mcg Intravenous Given 03/19/21 2358)  ketorolac (TORADOL) 30 MG/ML injection 15 mg (15 mg Intravenous Given 03/19/21 2358)    ED Course/ Medical Decision Making/ A&P                           Medical Decision Making This patient presents to the ED with chief complaint(s) of persistent cough, severe sore throat, pain with swallowing with pertinent past medical history of COVID-19 on December 30 which further  complicates the presenting complaint.  Patient is status post paxlovid, and urgent care had put him on steroids and antibiotics subsequently -all which complicates the complaint more.  The complaint involves an extensive differential diagnosis and treatment options and also carries with it a high risk of complications and morbidity.    The differential diagnosis includes rebound COVID-19, PE, pneumonia, esophagitis, long COVID-19 syndrome.  In the triage they had ordered a PCR COVID-19 test which is positive.  This test however does not have significant value in this setting.  Unfortunately, at the bedside are dropped which we do not have an antigen test -which is a better test to see if there is rebound COVID-19.  Fortunately, patient indicates that he had done of rapid antigen test at home 2 days ago and it was negative.  Suspicion therefore for rebound COVID-19 is low.  Additional Test Considered: CT PE given the history of PE and also increase risk for PE from COVID-19.  However, patient's heart rate is in the 50s and 60s, he is not hypoxic and he does not have any pleuritic chest pain.  His shortness of breath is mostly posttussive.  Exertional shortness of breath is unchanged since the diagnosis of COVID.  Clinically, suspicion for PE is extremely low.  Given that he had recent antibiotics, I do not think we will find any superimposed bacterial pneumonia.  Additionally, the white count and the chest x-ray are reassuring.  The focus will be symptom  management while in the ER. Patient and wife indicated that he is having significant pain with swallowing.  They think that the symptoms are more esophageal and not just the throat.  Patient's throat exam is reassuring.  He had a long course of steroids by urgent care, wondering if there is a possibility of esophagitis.  Patient indicates that his sore throat is different than the sore throat he had with COVID-19.  We will put him on Diflucan for 7 days with Carafate.     Additional history obtained: Additional history obtained from family Records reviewed Care Everywhere/External Records  Reassessment and review: Lab Tests: I Ordered, and personally interpreted labs.  The pertinent results include: Normal CBC, metabolic profile and positive PCR COVID-19 test with negative flu test.  Imaging Studies ordered: I independently visualized and interpreted the following imaging chest x-ray which showed no evidence of focal consolidation. I agree with the radiologist interpretation  Cardiac Monitoring: The patient was maintained on a cardiac monitor.  I personally viewed and interpreted the cardiac monitor which showed an underlying rhythm of:  sinus rhythm  Medicines ordered and prescription drug management: I ordered the following medications IV pain medication, Tylenol with codeine for cough, albuterol inhaler for bronchodilation.    Reevaluation of the patient after these medicines showed that the patient    improved  Complexity of problems addressed: Patient's presentation is most consistent with  severe exacerbation of chronic illness, acute illness/injury with systemic symptoms, and acute complicated illness/injury requiring diagnostic workup     During patient's assessment  Disposition: After consideration of the diagnostic results and the patient's response to treatment,  I feel that the patent would benefit from discharge with follow-up request with pulmonary service as allegedly  PCP is not seeing patient for COVID-19 and I do not think going to urgent care has been useful.   Amount and/or Complexity of Data Reviewed Labs: ordered. Radiology: ordered.  Risk Prescription drug management. Decision regarding hospitalization. Risk Details: I do not think patient needs  admission to the hospital at this time.  Strict ER return precautions discussed.           Final Clinical Impression(s) / ED Diagnoses Final diagnoses:  Pharyngitis, unspecified etiology  Esophagitis  Post-COVID chronic cough    Rx / DC Orders ED Discharge Orders          Ordered    fluconazole (DIFLUCAN) 200 MG tablet  Daily        03/19/21 2329    sucralfate (CARAFATE) 1 g tablet  3 times daily with meals & bedtime        03/19/21 2329    acetaminophen-codeine (TYLENOL #3) 300-30 MG tablet  Every 6 hours PRN        03/19/21 2331              Varney Biles, MD 03/19/21 2359

## 2021-03-19 NOTE — Discharge Instructions (Signed)
We saw in the ER for sore throat and chronic cough.   Most likely, your symptoms are sequelae of your COVID-19.  Take the Tylenol with codeine as prescribed for cough management.  Take Diflucan for suspected esophagitis at this time given that you are prednisone for extended period of time.  We recommend that you follow-up with your primary care doctor in 1 week.  Return to the ER if your symptoms get worse, specifically start developing fevers, increasing shortness of breath, confusion.

## 2021-03-19 NOTE — ED Triage Notes (Signed)
Covid + dec 30th, increasing pain in throat with cough. Cough worse at night that he feels like he is drowning. Denies fevers.

## 2021-04-15 ENCOUNTER — Other Ambulatory Visit: Payer: Self-pay | Admitting: Pulmonary Disease

## 2021-04-15 ENCOUNTER — Other Ambulatory Visit: Payer: Self-pay

## 2021-04-15 ENCOUNTER — Ambulatory Visit: Payer: 59 | Admitting: Pulmonary Disease

## 2021-04-15 ENCOUNTER — Encounter: Payer: Self-pay | Admitting: Pulmonary Disease

## 2021-04-15 VITALS — BP 114/72 | HR 60 | Ht 71.0 in | Wt 242.4 lb

## 2021-04-15 DIAGNOSIS — R059 Cough, unspecified: Secondary | ICD-10-CM | POA: Diagnosis not present

## 2021-04-15 DIAGNOSIS — R0609 Other forms of dyspnea: Secondary | ICD-10-CM | POA: Diagnosis not present

## 2021-04-15 MED ORDER — HYDROCODONE BIT-HOMATROP MBR 5-1.5 MG/5ML PO SOLN: 5.0000 mL | Freq: Four times a day (QID) | ORAL | 0 refills | Status: DC | PRN

## 2021-04-15 MED ORDER — FLUTICASONE FUROATE-VILANTEROL 100-25 MCG/ACT IN AEPB: 1.0000 | INHALATION_SPRAY | Freq: Every day | RESPIRATORY_TRACT | 6 refills | Status: DC

## 2021-04-15 MED ORDER — PANTOPRAZOLE SODIUM 40 MG PO TBEC: 40.0000 mg | DELAYED_RELEASE_TABLET | Freq: Every day | ORAL | 6 refills | Status: DC

## 2021-04-15 MED ORDER — PREDNISONE 10 MG PO TABS
ORAL_TABLET | ORAL | 0 refills | Status: AC
Start: 2021-04-15 — End: 2021-04-27

## 2021-04-15 NOTE — Patient Instructions (Addendum)
Start prednisone taper: 4 tabs daily for 3 days 3 tabs daily for 3 days 2 tabs daily for 3 days 1 tab daily for 3 days  Use Breo ellipta 1 puff daily - rinse mouth out after each use  Use hycodan cough syrup as needed for cough at bedtime  Start protonix 40mg  daily for possible reflux - take 30 minutes prior to breakfast  Follow up in 1 month with pulmonary function tests
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
## 2021-04-16 ENCOUNTER — Other Ambulatory Visit: Payer: Self-pay | Admitting: Family Medicine

## 2021-04-16 DIAGNOSIS — I10 Essential (primary) hypertension: Secondary | ICD-10-CM

## 2021-04-16 NOTE — Telephone Encounter (Signed)
Called and spoke with patient. He is aware that his inhaler has been switched to AirDuo. Verbalized understanding of instructions.   Nothing further needed at time of call.

## 2021-04-16 NOTE — Telephone Encounter (Signed)
Received a message from the pharmacy stating that the insurance will not cover Breo 183mcg. Insurance will pay for AirDuo 113-57mcg instead.   Dr. Erin Fulling, are you ok with him switching to AirDuo?

## 2021-04-16 NOTE — Telephone Encounter (Signed)
Ok to switch 

## 2021-04-22 ENCOUNTER — Encounter: Payer: Self-pay | Admitting: Pulmonary Disease

## 2021-05-11 ENCOUNTER — Other Ambulatory Visit: Payer: Self-pay | Admitting: Cardiology

## 2021-05-11 DIAGNOSIS — I712 Thoracic aortic aneurysm, without rupture, unspecified: Secondary | ICD-10-CM

## 2021-05-11 DIAGNOSIS — I10 Essential (primary) hypertension: Secondary | ICD-10-CM

## 2021-05-12 ENCOUNTER — Other Ambulatory Visit: Payer: Self-pay | Admitting: Cardiology

## 2021-05-12 ENCOUNTER — Other Ambulatory Visit: Payer: Self-pay | Admitting: Family Medicine

## 2021-05-12 DIAGNOSIS — I10 Essential (primary) hypertension: Secondary | ICD-10-CM

## 2021-05-25 ENCOUNTER — Other Ambulatory Visit: Payer: Self-pay | Admitting: Family Medicine

## 2021-05-25 ENCOUNTER — Other Ambulatory Visit: Payer: Self-pay | Admitting: Cardiology

## 2021-05-25 DIAGNOSIS — I10 Essential (primary) hypertension: Secondary | ICD-10-CM

## 2021-05-25 DIAGNOSIS — I712 Thoracic aortic aneurysm, without rupture, unspecified: Secondary | ICD-10-CM

## 2021-05-28 IMAGING — DX DG CHEST 2V PORT
1 series · 2 of 2 positions shown · non-contrast
Comparison: CT chest dated 04/28/2019

CLINICAL DATA: Abdominal pain status post colonoscopy

EXAM:
CHEST  2 VIEW PORTABLE

[Series 1: chest · 0.14mm/px · 2 of 2 slices shown]
[im 1/2]
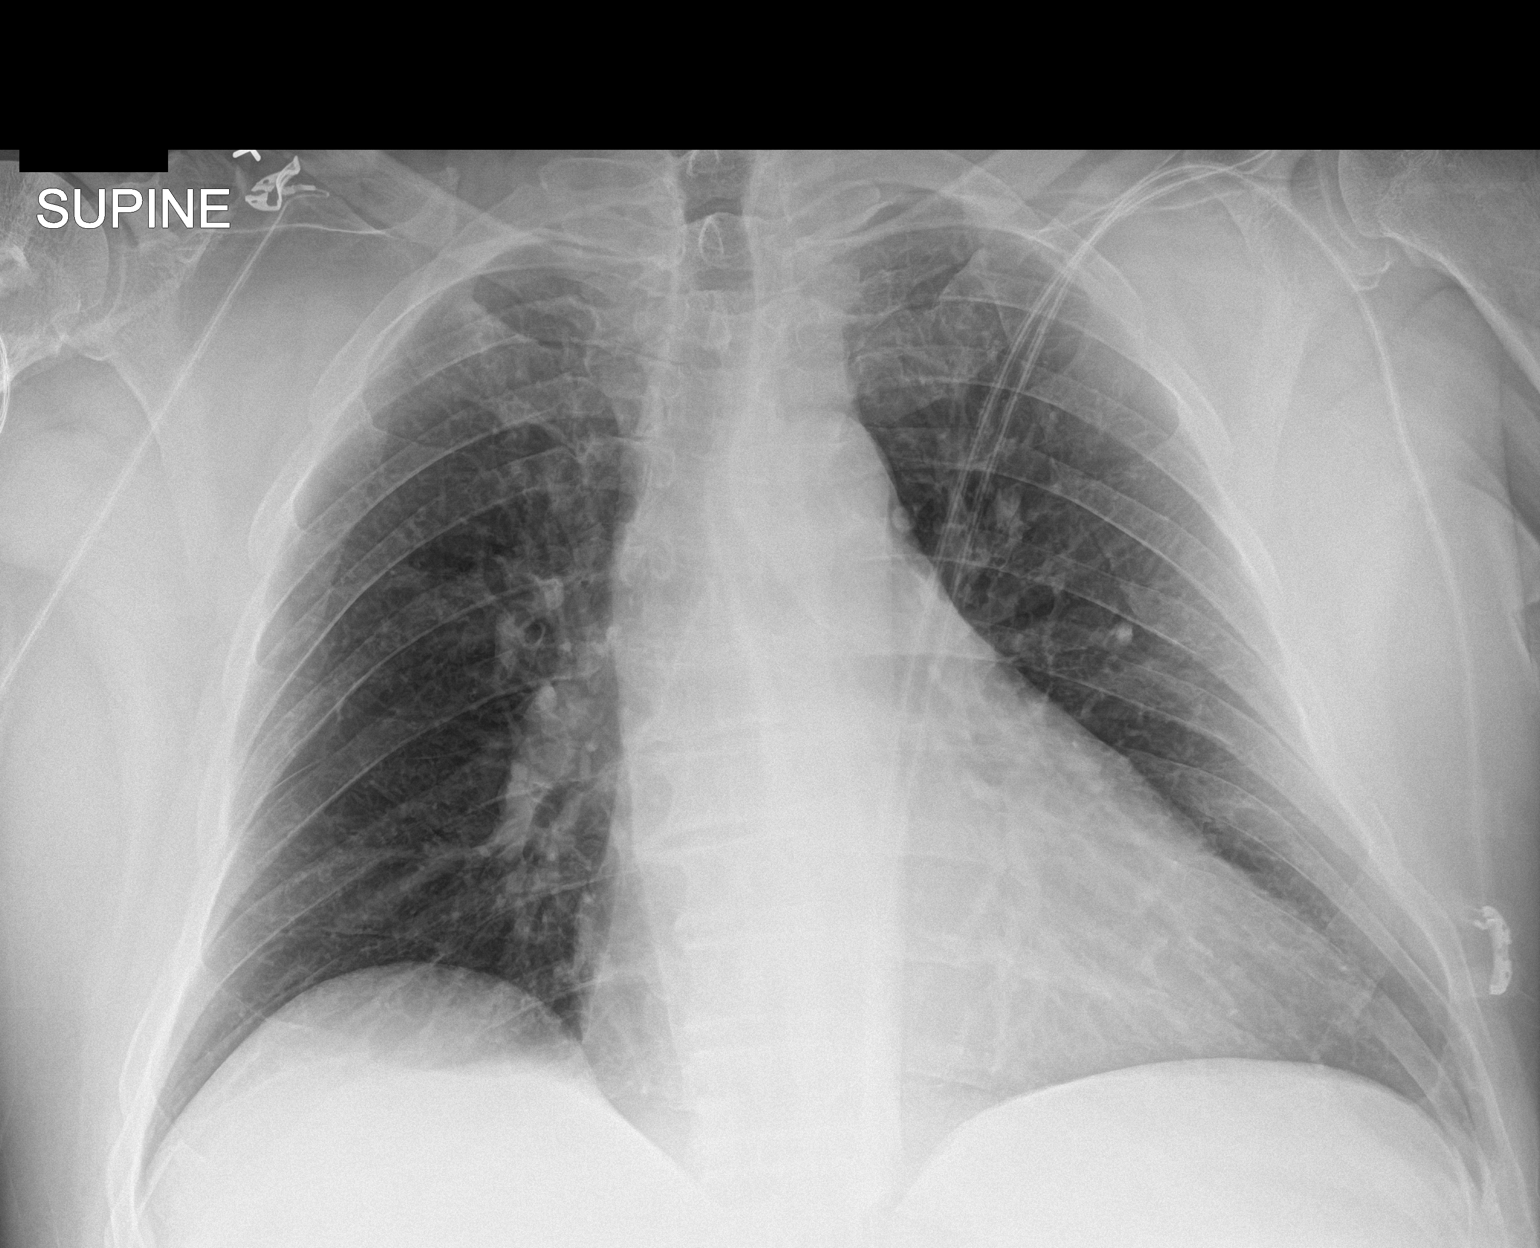
[im 2/2]
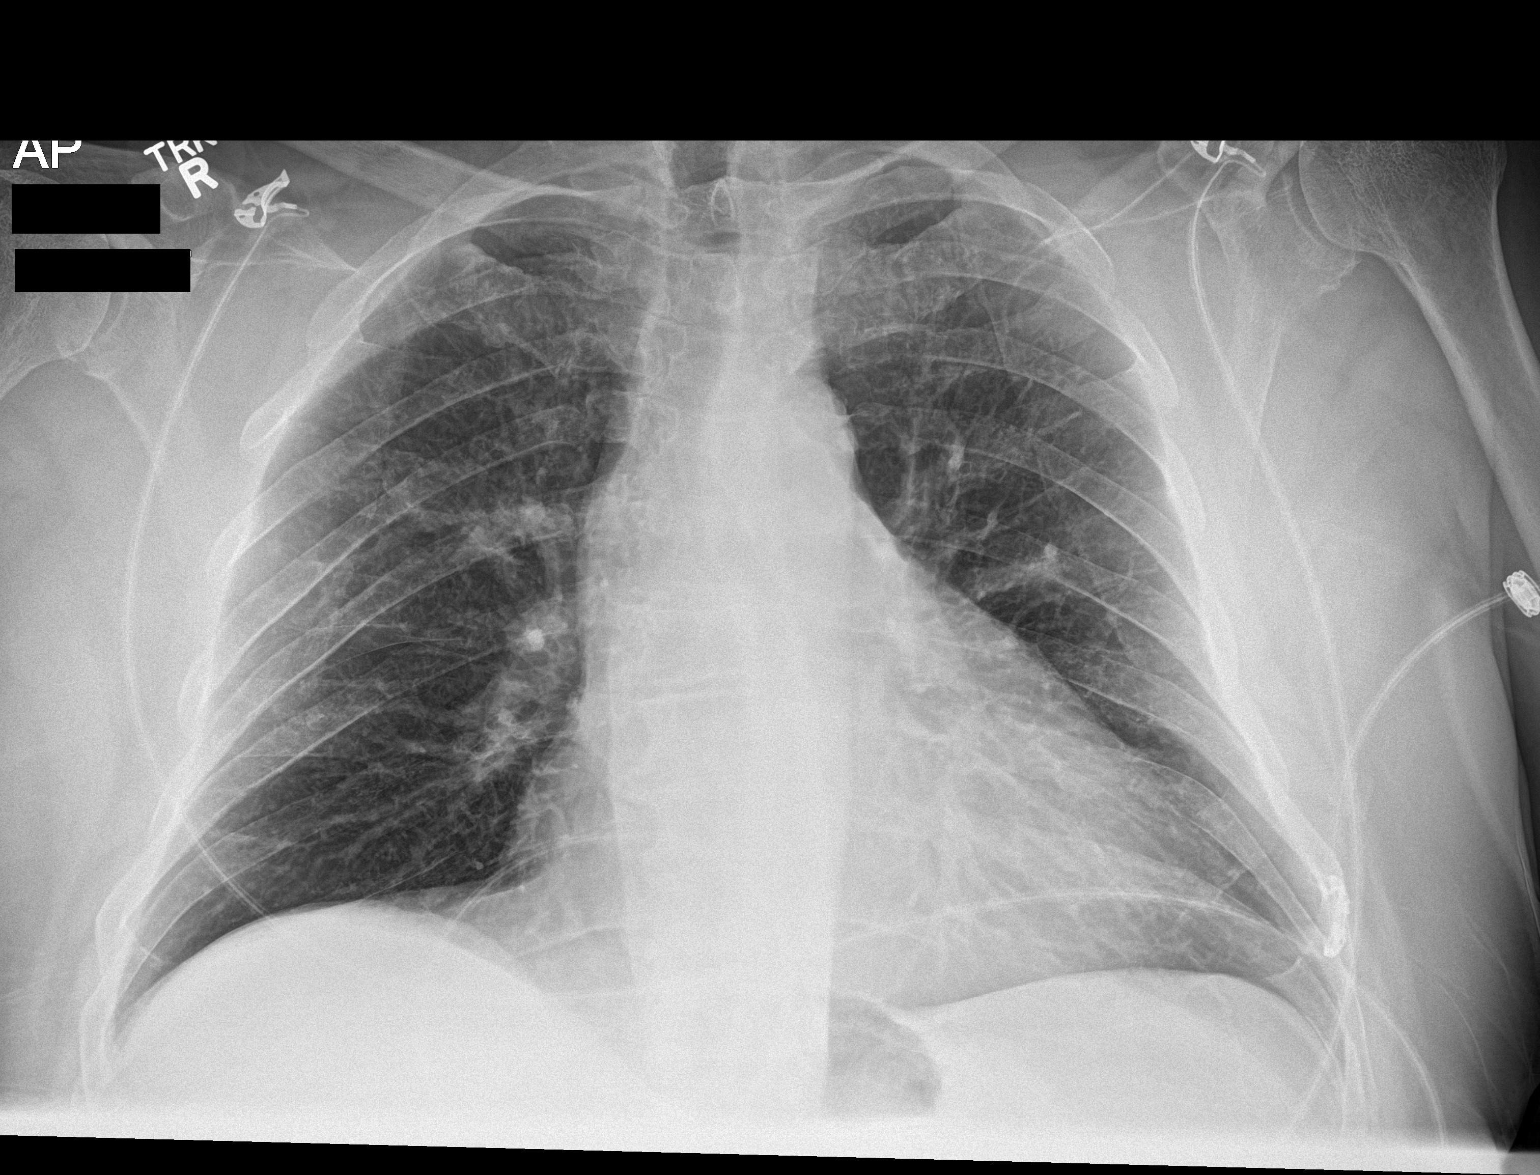

[2 of 2 positions shown; findings below may reference images not displayed]

FINDINGS: Lungs are clear.  No pleural effusion or pneumothorax.

The heart is normal in size.

No free air under the diaphragms on the erect chest radiograph.
IMPRESSION: No evidence of acute cardiopulmonary disease.

No free air under the diaphragms on the right chest radiograph.

## 2021-06-02 ENCOUNTER — Ambulatory Visit: Payer: 59 | Admitting: Pulmonary Disease

## 2021-06-02 ENCOUNTER — Encounter: Payer: Self-pay | Admitting: Pulmonary Disease

## 2021-06-02 ENCOUNTER — Ambulatory Visit (INDEPENDENT_AMBULATORY_CARE_PROVIDER_SITE_OTHER): Payer: 59 | Admitting: Pulmonary Disease

## 2021-06-02 VITALS — BP 124/72 | HR 62 | Ht 72.0 in | Wt 247.8 lb

## 2021-06-02 DIAGNOSIS — R059 Cough, unspecified: Secondary | ICD-10-CM

## 2021-06-02 DIAGNOSIS — R0609 Other forms of dyspnea: Secondary | ICD-10-CM | POA: Diagnosis not present

## 2021-06-02 DIAGNOSIS — R768 Other specified abnormal immunological findings in serum: Secondary | ICD-10-CM

## 2021-06-02 LAB — PULMONARY FUNCTION TEST
DL/VA % pred: 97 %
DL/VA: 3.92 ml/min/mmHg/L
DLCO cor % pred: 77 %
DLCO cor: 21.28 ml/min/mmHg
DLCO unc % pred: 77 %
DLCO unc: 21.28 ml/min/mmHg
FEF 25-75 Post: 3.66 L/sec
FEF 25-75 Pre: 2.86 L/sec
FEF2575-%Change-Post: 28 %
FEF2575-%Pred-Post: 137 %
FEF2575-%Pred-Pre: 107 %
FEV1-%Change-Post: 4 %
FEV1-%Pred-Post: 77 %
FEV1-%Pred-Pre: 74 %
FEV1-Post: 2.73 L
FEV1-Pre: 2.61 L
FEV1FVC-%Change-Post: 0 %
FEV1FVC-%Pred-Pre: 112 %
FEV6-%Change-Post: 5 %
FEV6-%Pred-Post: 73 %
FEV6-%Pred-Pre: 69 %
FEV6-Post: 3.31 L
FEV6-Pre: 3.14 L
FEV6FVC-%Pred-Post: 105 %
FEV6FVC-%Pred-Pre: 105 %
FVC-%Change-Post: 5 %
FVC-%Pred-Post: 69 %
FVC-%Pred-Pre: 65 %
FVC-Post: 3.32 L
FVC-Pre: 3.14 L
Post FEV1/FVC ratio: 82 %
Post FEV6/FVC ratio: 100 %
Pre FEV1/FVC ratio: 83 %
Pre FEV6/FVC Ratio: 100 %
RV % pred: 115 %
RV: 2.95 L
TLC % pred: 84 %
TLC: 6.3 L

## 2021-06-02 LAB — CK: Total CK: 77 U/L (ref 7–232)

## 2021-06-02 LAB — SEDIMENTATION RATE: Sed Rate: 8 mm/hr (ref 0–20)

## 2021-06-02 NOTE — Patient Instructions (Signed)
We will check inflammatory labs today to rule out possible interstitial lung disease ? ?We will check a high resolution CT Chest scan in the next week or two. ? ?We will be in touch about the results ? ?Follow up in 2 months ?
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
## 2021-06-02 NOTE — Progress Notes (Signed)
PFT done today. 

## 2021-06-04 LAB — CYCLIC CITRUL PEPTIDE ANTIBODY, IGG/IGA: Cyclic Citrullin Peptide Ab: <1 units (ref 0–19)

## 2021-06-07 LAB — ANTI-SCLERODERMA ANTIBODY: Scleroderma (Scl-70) (ENA) Antibody, IgG: <1 AI

## 2021-06-07 LAB — ANCA SCREEN W REFLEX TITER: ANCA SCREEN: NEGATIVE

## 2021-06-07 LAB — ANTI-SMITH ANTIBODY: ENA SM Ab Ser-aCnc: <1 AI

## 2021-06-07 LAB — HYPERSENSITIVITY PNUEMONITIS PROFILE
ASPERGILLUS FUMIGATUS: NEGATIVE
Faenia retivirgula: NEGATIVE
Pigeon Serum: NEGATIVE
S. VIRIDIS: NEGATIVE
T. CANDIDUS: NEGATIVE
T. VULGARIS: NEGATIVE

## 2021-06-07 LAB — IGG, IGA, IGM
IgG (Immunoglobin G), Serum: 781 mg/dL (ref 600–1540)
IgM, Serum: 108 mg/dL (ref 50–300)
Immunoglobulin A: 135 mg/dL (ref 70–320)

## 2021-06-07 LAB — SJOGRENS SYNDROME-A EXTRACTABLE NUCLEAR ANTIBODY: SSA (Ro) (ENA) Antibody, IgG: <1 AI

## 2021-06-07 LAB — SJOGRENS SYNDROME-B EXTRACTABLE NUCLEAR ANTIBODY: SSB (La) (ENA) Antibody, IgG: <1 AI

## 2021-06-07 LAB — ANTI-DNA ANTIBODY, DOUBLE-STRANDED: ds DNA Ab: <1 IU/mL

## 2021-06-07 LAB — RNP ANTIBODY: Ribonucleic Protein(ENA) Antibody, IgG: <1 AI

## 2021-06-07 LAB — ANA: Anti Nuclear Antibody (ANA): NEGATIVE

## 2021-06-07 LAB — RHEUMATOID FACTOR: Rheumatoid fact SerPl-aCnc: <14 IU/mL (ref <14)

## 2021-06-07 LAB — IGE: IgE (Immunoglobulin E), Serum: 5 kU/L (ref <=114)

## 2021-06-13 ENCOUNTER — Ambulatory Visit (HOSPITAL_BASED_OUTPATIENT_CLINIC_OR_DEPARTMENT_OTHER)
Admission: RE | Admit: 2021-06-13 | Discharge: 2021-06-13 | Disposition: A | Discharge status: Home / Self Care | Payer: 59 | Source: Ambulatory Visit | Attending: Pulmonary Disease | Admitting: Pulmonary Disease

## 2021-06-13 DIAGNOSIS — R0609 Other forms of dyspnea: Secondary | ICD-10-CM | POA: Diagnosis present

## 2021-06-13 DIAGNOSIS — R059 Cough, unspecified: Secondary | ICD-10-CM | POA: Diagnosis present

## 2021-07-25 ENCOUNTER — Telehealth: Payer: Self-pay

## 2021-07-25 ENCOUNTER — Telehealth: Payer: Self-pay | Admitting: Gastroenterology

## 2021-07-25 DIAGNOSIS — I10 Essential (primary) hypertension: Secondary | ICD-10-CM

## 2021-07-25 MED ORDER — VALSARTAN 80 MG PO TABS: 80.0000 mg | ORAL_TABLET | Freq: Every day | ORAL | 0 refills | Status: DC

## 2021-07-25 NOTE — Telephone Encounter (Signed)
Hello Dr. Rush Landmark,  We received a call from patient spouse wanting to schedule both an EGD and colonoscopy. Patient has a recall colonoscopy 09/08/2020 at hospital. I don't see a recall for EGD. Per spouse, patient has mentioned having a difficult time swallowing and has been coughing. Does patient need an OV first or can we schedule both? Please advise on scheduling.   Thank you!

## 2021-07-25 NOTE — Telephone Encounter (Signed)
Pts wife called requesting refills for the pts medications. I did not see the atenolol on the pts medication list and when I checked the pts chart, it looks like this med was discontinued by another office. Should pt be taking this?

## 2021-07-25 NOTE — Telephone Encounter (Signed)
Let me see him sooner than 6/12 so I can address it. Refill for now.  Thanks MJP

## 2021-07-26 NOTE — Telephone Encounter (Signed)
Thank you for forwarding this to me. Yes I am not sure what happened with the recall since it was in the system. In either case, we should go ahead and get him scheduled for colonoscopy with EMR.  He can have an endoscopy added to that for evaluation of dysphagia. Patty, when you talk with him, depending on the symptoms that he is telling you, he could require an earlier endoscopy and so please update HD and myself about this because if he needs an earlier endoscopy HD may be able to get that done before our procedures that are being scheduled in August/September at this point. Thanks. GM  FYI HD

## 2021-07-29 MED ORDER — VALSARTAN 80 MG PO TABS: 80.0000 mg | ORAL_TABLET | Freq: Every day | ORAL | 1 refills | Status: DC

## 2021-07-29 NOTE — Telephone Encounter (Signed)
Patty,  Please give him a June office appointment with me or one of the APPs to evaluate his symptoms and determine if upper endoscopy is necessary versus a barium study.  I see a recent pulmonary clinic evaluation for persistent cough since a COVID infection last year with work-up in progress.  HD

## 2021-07-29 NOTE — Telephone Encounter (Signed)
7/5 10 am appt with Dr Loletha Carrow to discuss EGD Colon EMR with GM

## 2021-07-29 NOTE — Telephone Encounter (Signed)
Thanks for update. Please set up for Colonoscopy with EMR timeslot in the interim and have on hold. Thanks. GM

## 2021-07-29 NOTE — Telephone Encounter (Signed)
Called pt, no answer. Left vm requesting call back?

## 2021-07-31 ENCOUNTER — Other Ambulatory Visit: Payer: Self-pay

## 2021-07-31 DIAGNOSIS — K635 Polyp of colon: Secondary | ICD-10-CM

## 2021-07-31 MED ORDER — PEG 3350-KCL-NA BICARB-NACL 420 G PO SOLR: 4000.0000 mL | Freq: Once | ORAL | 0 refills | Status: DC

## 2021-07-31 NOTE — Telephone Encounter (Signed)
Colon EMR scheduled for 10/06/21 at 1 pm at [facility] with GM   Left message on machine to call back

## 2021-07-31 NOTE — Telephone Encounter (Signed)
Colon EMR scheduled, pt instructed and medications reviewed.  Patient instructions mailed to home.  Patient to call with any questions or concerns.  The pt has also been advised of the 7/5 office appt with Dr Loletha Carrow

## 2021-07-31 NOTE — Telephone Encounter (Signed)
Patient is aware 

## 2021-08-04 ENCOUNTER — Encounter: Payer: Self-pay | Admitting: Pulmonary Disease

## 2021-08-04 ENCOUNTER — Ambulatory Visit: Payer: 59 | Admitting: Pulmonary Disease

## 2021-08-04 VITALS — BP 136/74 | HR 67 | Ht 72.0 in | Wt 245.4 lb

## 2021-08-04 DIAGNOSIS — M2550 Pain in unspecified joint: Secondary | ICD-10-CM | POA: Diagnosis not present

## 2021-08-04 DIAGNOSIS — R059 Cough, unspecified: Secondary | ICD-10-CM | POA: Diagnosis not present

## 2021-08-04 DIAGNOSIS — R0602 Shortness of breath: Secondary | ICD-10-CM

## 2021-08-04 MED ORDER — MONTELUKAST SODIUM 10 MG PO TABS: 10.0000 mg | ORAL_TABLET | Freq: Every day | ORAL | 11 refills | Status: DC

## 2021-08-04 MED ORDER — BREZTRI AEROSPHERE 160-9-4.8 MCG/ACT IN AERO: 2.0000 | INHALATION_SPRAY | Freq: Two times a day (BID) | RESPIRATORY_TRACT | 6 refills | Status: DC
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
## 2021-08-04 NOTE — Patient Instructions (Addendum)
Start singulair '10mg'$  daily  Start fexofenadine '180mg'$  daily  Start Breztri inhaler 2 puffs twice daily  Continue pantroprazole '40mg'$  daily

## 2021-08-07 ENCOUNTER — Encounter: Payer: Self-pay | Admitting: Cardiology

## 2021-08-07 ENCOUNTER — Ambulatory Visit: Payer: 59 | Admitting: Cardiology

## 2021-08-07 VITALS — BP 122/79 | HR 67 | Temp 98.5°F | Resp 16 | Ht 72.0 in | Wt 248.0 lb

## 2021-08-07 DIAGNOSIS — Z86711 Personal history of pulmonary embolism: Secondary | ICD-10-CM

## 2021-08-07 DIAGNOSIS — I7121 Aneurysm of the ascending aorta, without rupture: Secondary | ICD-10-CM | POA: Insufficient documentation

## 2021-08-07 DIAGNOSIS — I25118 Atherosclerotic heart disease of native coronary artery with other forms of angina pectoris: Secondary | ICD-10-CM

## 2021-08-07 DIAGNOSIS — R0609 Other forms of dyspnea: Secondary | ICD-10-CM

## 2021-08-07 DIAGNOSIS — I10 Essential (primary) hypertension: Secondary | ICD-10-CM

## 2021-08-07 MED ORDER — ATENOLOL 25 MG PO TABS: 12.5000 mg | ORAL_TABLET | Freq: Every day | ORAL | 2 refills | Status: DC

## 2021-08-07 MED ORDER — FUROSEMIDE 20 MG PO TABS: 20.0000 mg | ORAL_TABLET | Freq: Every day | ORAL | 3 refills | Status: DC

## 2021-08-07 NOTE — Progress Notes (Signed)
Patient referred by [name], [name], MD for exertional chest pain and dyspnea  Subjective:   Willie Dominguez, male    DOB: May 11, 1950, 71 y.o.   MRN: [id number]   Chief Complaint  Patient presents with   Coronary Artery Disease   Medication Management   Follow-up    1 year    HPI  71 year old Caucasian male with hypertension, hyperlipidemia, history of bilateral lower extremity DVT and PE in 01/2018, family h/o CAD,with exertional dyspnea, mild thoracic aorta anuerysm  Patient is here today with his wife. He has continued to have exertional dyspnea since 2020, and has only gotten worse since he had COVID in 01/2021. In addition, he had at least one episode of left sided chest pain during yard work, that resolved with rest. He has not had to take nitroglycerin. Pulmonary evaluation suggest nonspecific PFT pattern. He has been prescribed new inhalers by pulmonologist Dr. Erin Fulling. Recently, he had also had bilateral leg edema.  Prior history: Patient originally saw me in September 2020 for symptoms of shortness of breath.  His symptoms seem to have worsened since his PE in December 2019.  However, on further review, the symptoms were present even prior to that.  He never had any significant chest pain symptoms.  Patient's subsequent CT angiogram in January 2020 did not show any PE. Work-up with echocardiogram did not show any pulmonary hypertension.  Stress test was equivocal.  I had initially referred him to [facility] pulmonology, but it appears that he never heard from them.  In the meantime, given his worsening symptoms all persistent shortness of breath, angina equivalent remains in the differential.  Thus, I recommended right and left heart catheterization and pulmonary angiography.    Cath showed no evidence of pulmonary hypertension, but showed severe small PDA bifurcation stenosis, severe stenosis and small caliber ramus, and dFR positive LAD stenosis.  He underwent successful LAD  stenting with post PCI DFR showing residual nonsignificant disease distally.  Given the amount of contrast used, I decided to stage.  Intervention in the future, as clinically necessary.  04/2019: Patient had not had any improvement in her shortness of breath symptoms.  Matter-of-fact, shortness of breath has worsened in the past 1 week.  He shortness of breath is present at both rest and exertion.  He also complains of dry cough, which is new.  He does not have any fever, chills, or other infectious symptoms.  He has had occasional "twinge" in his chest that lasts for a few seconds, but also has a separate chest pressure that is constant 24X7.  Patient was seen by [facility] pulmonology in [facility] (04/2019) . His pulmonary function test indicated restrictive pattern. He was started on Spiriva. He was also seen by Rheumatology (05/2019) for ANA speckled pattern 1:320. Further workup is ongoing.   05/2019: In last four weeks, he has had one episode of chest pressure while working in the yard. His episodes of shortness of breath, cough continue. He has noticed some leg swelling.   06/2019: Since starting Spiriva, his shortness of breath and cough have had slight improvement. Shortness of breath is more prominent on walking up the hill or walking upstairs. He has not had any recurrence of chest pressure sometimes. His blood pressure is running higher than usual. He has been off Valsartan as were trying to elucidate etiology of his cough.   10/2019:  Patient underwent successful intervention to right PDA on 10/03/2019. Unfortunately, in spite of successful LAD and  RPDA PCI's, his dyspnea symptoms have not improved. He had a few sharp chest pain episodes lasting for 1-2 seconds after RPDA PCI, which completely resolved. He has since had two COVID vaccines, which made him sick He has recovered from that as well. However, his exertional dyspnea seems to have gotten worse. He denies any exertional chest pain.     Current Outpatient Medications:    aspirin EC 81 MG tablet, Take 81 mg by mouth daily., Disp: , Rfl:    atenolol (TENORMIN) 25 MG tablet, Take 12.5 mg by mouth daily., Disp: , Rfl:    Budeson-Glycopyrrol-Formoterol (BREZTRI AEROSPHERE) 160-9-4.8 MCG/ACT AERO, Inhale 2 puffs into the lungs in the morning and at bedtime., Disp: 10.7 g, Rfl: 6   Cholecalciferol (VITAMIN D-3) 125 MCG (5000 UT) TABS, Take 5,000 Units by mouth daily., Disp: , Rfl:    Coenzyme Q10 (COQ10) 100 MG CAPS, Take 100 mg by mouth daily., Disp: , Rfl:    montelukast (SINGULAIR) 10 MG tablet, Take 1 tablet (10 mg total) by mouth at bedtime., Disp: 30 tablet, Rfl: 11   nitroGLYCERIN (NITROSTAT) 0.4 MG SL tablet, Place 1 tablet (0.4 mg total) under the tongue every 5 (five) minutes as needed for chest pain., Disp: 30 tablet, Rfl: 3   Omega-3 Fatty Acids (FISH OIL) 1000 MG CAPS, Take 1,000 mg by mouth daily., Disp: , Rfl:    pantoprazole (PROTONIX) 40 MG tablet, Take 1 tablet (40 mg total) by mouth daily., Disp: 30 tablet, Rfl: 6   rosuvastatin (CRESTOR) 20 MG tablet, TAKE 1 TABLET BY MOUTH EVERY DAY, Disp: 30 tablet, Rfl: 11   valsartan (DIOVAN) 80 MG tablet, Take 1 tablet (80 mg total) by mouth daily., Disp: 30 tablet, Rfl: 1   vitamin B-12 (CYANOCOBALAMIN) 1000 MCG tablet, Take 1,000 mcg by mouth daily., Disp: , Rfl:   Cardiovascular studies:  EKG 08/07/2021: Sinus rhythm 62 bpm Normal EKG  CTA 05/2021: 1. No findings of tracheobronchomalacia or interstitial lung disease. 2. Old granulomatous disease, as above. 3. Mild elevation of the posterior aspect of the right hemidiaphragm. 4. Aortic atherosclerosis, in addition to three-vessel coronary artery disease. Please note that although the presence of coronary artery calcium documents the presence of coronary artery disease, the severity of this disease and any potential stenosis cannot be assessed on this non-gated CT examination. Assessment for potential risk factor  modification, dietary therapy or pharmacologic therapy may be warranted, if clinically indicated. 5. Ectasia of the ascending thoracic aorta (4.4 cm in diameter). Recommend annual imaging followup by CTA or MRA. This recommendation follows 2010 ACCF/AHA/AATS/ACR/ASA/SCA/SCAI/SIR/STS/SVM Guidelines for the Diagnosis and Management of Patients with Thoracic Aortic Disease. Circulation. 2010; 121: V672-C947. Aortic aneurysm NOS (ICD10-I71.9). 6. 4 mm nonobstructive calculus in the interpolar collecting system of the right kidney.  Coronary intervention 10/03/2019: LM: Normal LAD: Patent prox LAD stent.        Mid 40% diffuse disease.        Post PCI dFR in 04/2019 was 0.95 RI: Prox 80% disease. Small caliber vessel. LCx: No significant stenosis. RCA: RPDA main branch with 90% stenosis. Another septal branch with diffuse 70% disease in small caliber vessel.        Successful cutting balloon PTCA and stent placement 2.25 X 18 mm Resolute Onyx drug-eluting stent  LHC/RHC/coronary intervention 04/18/2019: LM: Normal LAD: Prox-mid 80% stenosis and LAD/D1 bifurcation (1,0,0). Moderate disease in prox Diag.         LAD dFR 0.87  Successful percutaneous coronary intervention prox LAD        PTCA and stent placement 2.75 X 12 mm Resolute Onyx drug-eluting stent        Post dilatation with 3.0X8 mm [place] balloon           Post PCI LAD dFR 0.95 RI: Prox 80% disease. Small caliber vessel. LCx: No significant stenosis. RCA: Distal 40%, PDA bifurcation 90% stenosis in small caliber vessel.   Right heart cath: RA: 5 mmHg RV: 27/6 mmHg PA: 26/14 mmHg, mean PA 19 mmHg PCW: 15 mmHg   If residual dyspnea symptoms, will stage PDA bifurcation stenosis stenting in future.  LE venous duplex US 03/2018: Left: Findings consistent with age indeterminate deep vein thrombosis involving the left popliteal vein, and left peroneal vein. No cystic structure found in the popliteal fossa. Compared to previous  study, femoral vein and gastroc vein thrombus has  Resolved.  LE venous duplex US 01/2018: Right: Findings consistent with acute deep vein thrombosis involving the right peroneal vein. Left: Findings consistent with acute deep vein thrombosis involving the left proximal femoral vein, left gastrocnemius vein, left popliteal vein, left posterior tibial vein, and left peroneal vein.    Recent labs: 03/19/2021: Glucose 115, BUN/Cr 21/0.92. EGFR >60. Na/K 136/4.2.  H/H 12/37. MCV 94. Platelets 131  05/2019: Chol 106, TG 75, HDL 44, LDL 46   Review of Systems  Review of Systems  Cardiovascular:  Positive for dyspnea on exertion. Negative for chest pain, leg swelling, palpitations and syncope.  Respiratory:  Positive for shortness of breath. Negative for cough (Dry).           Vitals:   08/07/21 0959  BP: 122/79  Pulse: 67  Resp: 16  Temp: 98.5 F (36.9 C)  SpO2: 95%     Body mass index is 33.63 kg/m. Filed Weights   08/07/21 0959  Weight: 248 lb (112.5 kg)     Objective:  Physical Exam Vitals and nursing note reviewed.  Constitutional:      General: He is not in acute distress. Neck:     Vascular: No JVD.  Cardiovascular:     Rate and Rhythm: Normal rate and regular rhythm.     Heart sounds: Normal heart sounds. No murmur heard. Pulmonary:     Effort: Pulmonary effort is normal.     Breath sounds: Normal breath sounds. No wheezing or rales.       Assessment & Recommendations:   71 year old Caucasian male with hypertension, hyperlipidemia, history of bilateral lower extremity DVT and PE (01/2018), CAD, persistent dyspnea and new cough.  Exertional dyspnea: Continued to have dyspnea with recent worsening since COVID in 01/2021. At least one episode of chest pain. Also has leg edema.  I will obtain echocardiogram to see any systolic or diastolic dysfunction. If yes, then may need re-evaluation for obstructive coronary artery disease. If not, would then  recommend cardiopulmonary exercise stress testing to distinguish between different causes of exertional dyspnea. Added lasix 20 mg daily.  CAD: Continue Aspirin. Refilled atenolol. Continue statin Will check lipid panel.  Thoracic aorta aneurysm: 4.4 cm on CTA (05/2021). Continue atenolol and valsartan No family h/o aortopathy. He has twin daughters, one has congenital heart block. Will perform genetic testing for familial aortopathy. Repeat CTA in 2024  F/u in 4 weeks  Time spent: 40 min  Burn[place], MD [facility] Pager: [phone] Office: [phone] If no answer Cell [phone]

## 2021-08-11 ENCOUNTER — Ambulatory Visit: Payer: 59 | Admitting: Cardiology

## 2021-08-15 LAB — LIPID PANEL
Chol/HDL Ratio: 2.5 ratio (ref 0.0–5.0)
Cholesterol, Total: 116 mg/dL (ref 100–199)
HDL: 46 mg/dL (ref >39)
LDL Chol Calc (NIH): 50 mg/dL (ref 0–99)
Triglycerides: 111 mg/dL (ref 0–149)
VLDL Cholesterol Cal: 20 mg/dL (ref 5–40)

## 2021-08-20 NOTE — Progress Notes (Unsigned)
Patient referred by [name], [name], MD for exertional chest pain and dyspnea  Subjective:   Willie Dominguez, male    DOB: Sep 01, 1950, 71 y.o.   MRN: [id number]   No chief complaint on file.   HPI  71 y.o. Caucasian male with hypertension, hyperlipidemia, history of bilateral lower extremity DVT and PE in 01/2018, family h/o CAD, with exertional dyspnea, mild thoracic aorta anuerysm  Patient was last seen in the office 08/07/2021 by Dr. Virgina Jock at which time given patient's thoracic aortic aneurysm recommended genetic testing for familial aortopathy. He now presents for genetic testing sample collection. ***  ***  Patient is here today with his wife. He has continued to have exertional dyspnea since 2020, and has only gotten worse since he had COVID in 01/2021. In addition, he had at least one episode of left sided chest pain during yard work, that resolved with rest. He has not had to take nitroglycerin. Pulmonary evaluation suggest nonspecific PFT pattern. He has been prescribed new inhalers by pulmonologist Dr. Erin Fulling. Recently, he had also had bilateral leg edema.  Prior history: Patient originally saw me in September 2020 for symptoms of shortness of breath.  His symptoms seem to have worsened since his PE in December 2019.  However, on further review, the symptoms were present even prior to that.  He never had any significant chest pain symptoms.  Patient's subsequent CT angiogram in January 2020 did not show any PE. Work-up with echocardiogram did not show any pulmonary hypertension.  Stress test was equivocal.  I had initially referred him to [facility] pulmonology, but it appears that he never heard from them.  In the meantime, given his worsening symptoms all persistent shortness of breath, angina equivalent remains in the differential.  Thus, I recommended right and left heart catheterization and pulmonary angiography.    Cath showed no evidence of pulmonary hypertension, but  showed severe small PDA bifurcation stenosis, severe stenosis and small caliber ramus, and dFR positive LAD stenosis.  He underwent successful LAD stenting with post PCI DFR showing residual nonsignificant disease distally.  Given the amount of contrast used, I decided to stage.  Intervention in the future, as clinically necessary.  04/2019: Patient had not had any improvement in her shortness of breath symptoms.  Matter-of-fact, shortness of breath has worsened in the past 1 week.  He shortness of breath is present at both rest and exertion.  He also complains of dry cough, which is new.  He does not have any fever, chills, or other infectious symptoms.  He has had occasional "twinge" in his chest that lasts for a few seconds, but also has a separate chest pressure that is constant 24X7.  Patient was seen by [facility] pulmonology in [facility] (04/2019) . His pulmonary function test indicated restrictive pattern. He was started on Spiriva. He was also seen by Rheumatology (05/2019) for ANA speckled pattern 1:320. Further workup is ongoing.   05/2019: In last four weeks, he has had one episode of chest pressure while working in the yard. His episodes of shortness of breath, cough continue. He has noticed some leg swelling.   06/2019: Since starting Spiriva, his shortness of breath and cough have had slight improvement. Shortness of breath is more prominent on walking up the hill or walking upstairs. He has not had any recurrence of chest pressure sometimes. His blood pressure is running higher than usual. He has been off Valsartan as were trying to elucidate etiology of his cough.  10/2019:  Patient underwent successful intervention to right PDA on 10/03/2019. Unfortunately, in spite of successful LAD and RPDA PCI's, his dyspnea symptoms have not improved. He had a few sharp chest pain episodes lasting for 1-2 seconds after RPDA PCI, which completely resolved. He has since had two COVID vaccines, which made  him sick He has recovered from that as well. However, his exertional dyspnea seems to have gotten worse. He denies any exertional chest pain.    Current Outpatient Medications:    aspirin EC 81 MG tablet, Take 81 mg by mouth daily., Disp: , Rfl:    atenolol (TENORMIN) 25 MG tablet, Take 0.5 tablets (12.5 mg total) by mouth daily., Disp: 60 tablet, Rfl: 2   Budeson-Glycopyrrol-Formoterol (BREZTRI AEROSPHERE) 160-9-4.8 MCG/ACT AERO, Inhale 2 puffs into the lungs in the morning and at bedtime., Disp: 10.7 g, Rfl: 6   Cholecalciferol (VITAMIN D-3) 125 MCG (5000 UT) TABS, Take 5,000 Units by mouth daily., Disp: , Rfl:    Coenzyme Q10 (COQ10) 100 MG CAPS, Take 100 mg by mouth daily., Disp: , Rfl:    furosemide (LASIX) 20 MG tablet, Take 1 tablet (20 mg total) by mouth daily., Disp: 30 tablet, Rfl: 3   montelukast (SINGULAIR) 10 MG tablet, Take 1 tablet (10 mg total) by mouth at bedtime., Disp: 30 tablet, Rfl: 11   nitroGLYCERIN (NITROSTAT) 0.4 MG SL tablet, Place 1 tablet (0.4 mg total) under the tongue every 5 (five) minutes as needed for chest pain., Disp: 30 tablet, Rfl: 3   Omega-3 Fatty Acids (FISH OIL) 1000 MG CAPS, Take 1,000 mg by mouth daily., Disp: , Rfl:    pantoprazole (PROTONIX) 40 MG tablet, Take 1 tablet (40 mg total) by mouth daily., Disp: 30 tablet, Rfl: 6   rosuvastatin (CRESTOR) 20 MG tablet, TAKE 1 TABLET BY MOUTH EVERY DAY, Disp: 30 tablet, Rfl: 11   valsartan (DIOVAN) 80 MG tablet, Take 1 tablet (80 mg total) by mouth daily., Disp: 30 tablet, Rfl: 1   vitamin B-12 (CYANOCOBALAMIN) 1000 MCG tablet, Take 1,000 mcg by mouth daily., Disp: , Rfl:   Cardiovascular studies:  EKG 08/07/2021: Sinus rhythm 62 bpm Normal EKG  CTA 05/2021: 1. No findings of tracheobronchomalacia or interstitial lung disease. 2. Old granulomatous disease, as above. 3. Mild elevation of the posterior aspect of the right hemidiaphragm. 4. Aortic atherosclerosis, in addition to three-vessel  coronary artery disease. Please note that although the presence of coronary artery calcium documents the presence of coronary artery disease, the severity of this disease and any potential stenosis cannot be assessed on this non-gated CT examination. Assessment for potential risk factor modification, dietary therapy or pharmacologic therapy may be warranted, if clinically indicated. 5. Ectasia of the ascending thoracic aorta (4.4 cm in diameter). Recommend annual imaging followup by CTA or MRA. This recommendation follows 2010 ACCF/AHA/AATS/ACR/ASA/SCA/SCAI/SIR/STS/SVM Guidelines for the Diagnosis and Management of Patients with Thoracic Aortic Disease. Circulation. 2010; 121: I786-V672. Aortic aneurysm NOS (ICD10-I71.9). 6. 4 mm nonobstructive calculus in the interpolar collecting system of the right kidney.  Coronary intervention 10/03/2019: LM: Normal LAD: Patent prox LAD stent.        Mid 40% diffuse disease.        Post PCI dFR in 04/2019 was 0.95 RI: Prox 80% disease. Small caliber vessel. LCx: No significant stenosis. RCA: RPDA main branch with 90% stenosis. Another septal branch with diffuse 70% disease in small caliber vessel.        Successful cutting balloon PTCA and stent placement 2.25  X 18 mm Resolute Onyx drug-eluting stent  LHC/RHC/coronary intervention 04/18/2019: LM: Normal LAD: Prox-mid 80% stenosis and LAD/D1 bifurcation (1,0,0). Moderate disease in prox Diag.         LAD dFR 0.87        Successful percutaneous coronary intervention prox LAD        PTCA and stent placement 2.75 X 12 mm Resolute Onyx drug-eluting stent        Post dilatation with 3.0X8 mm [place] balloon           Post PCI LAD dFR 0.95 RI: Prox 80% disease. Small caliber vessel. LCx: No significant stenosis. RCA: Distal 40%, PDA bifurcation 90% stenosis in small caliber vessel.   Right heart cath: RA: 5 mmHg RV: 27/6 mmHg PA: 26/14 mmHg, mean PA 19 mmHg PCW: 15 mmHg   If residual dyspnea  symptoms, will stage PDA bifurcation stenosis stenting in future.  LE venous duplex US 03/2018: Left: Findings consistent with age indeterminate deep vein thrombosis involving the left popliteal vein, and left peroneal vein. No cystic structure found in the popliteal fossa. Compared to previous study, femoral vein and gastroc vein thrombus has  Resolved.  LE venous duplex US 01/2018: Right: Findings consistent with acute deep vein thrombosis involving the right peroneal vein. Left: Findings consistent with acute deep vein thrombosis involving the left proximal femoral vein, left gastrocnemius vein, left popliteal vein, left posterior tibial vein, and left peroneal vein.    Recent labs: 03/19/2021: Glucose 115, BUN/Cr 21/0.92. EGFR >60. Na/K 136/4.2.  H/H 12/37. MCV 94. Platelets 131  05/2019: Chol 106, TG 75, HDL 44, LDL 46   Review of Systems  Review of Systems  Cardiovascular:  Positive for dyspnea on exertion. Negative for chest pain, leg swelling, palpitations and syncope.  Respiratory:  Positive for shortness of breath. Negative for cough (Dry).           There were no vitals filed for this visit.    There is no height or weight on file to calculate BMI. There were no vitals filed for this visit.    Objective:  Physical Exam Vitals and nursing note reviewed.  Constitutional:      General: He is not in acute distress. Neck:     Vascular: No JVD.  Cardiovascular:     Rate and Rhythm: Normal rate and regular rhythm.     Heart sounds: Normal heart sounds. No murmur heard. Pulmonary:     Effort: Pulmonary effort is normal.     Breath sounds: Normal breath sounds. No wheezing or rales.        Assessment & Recommendations:   71 y.o. Caucasian male with hypertension, hyperlipidemia, history of bilateral lower extremity DVT and PE (01/2018), CAD, persistent dyspnea and new cough.  Exertional dyspnea: Continued to have dyspnea with recent worsening since COVID in  01/2021. At least one episode of chest pain. Also has leg edema.  I will obtain echocardiogram to see any systolic or diastolic dysfunction. If yes, then may need re-evaluation for obstructive coronary artery disease. If not, would then recommend cardiopulmonary exercise stress testing to distinguish between different causes of exertional dyspnea. Added lasix 20 mg daily.  CAD: Continue Aspirin. Refilled atenolol. Continue statin Will check lipid panel.  Thoracic aorta aneurysm: 4.4 cm on CTA (05/2021). Continue atenolol and valsartan No family h/o aortopathy. He has twin daughters, one has congenital heart block. Will perform genetic testing for familial aortopathy. Repeat CTA in 2024  F/u in 4 weeks  Time spent: 40 min  Grenelefe, MD [facility] Pager: [phone] Office: [phone] If no answer Cell [phone]

## 2021-08-21 ENCOUNTER — Ambulatory Visit: Payer: 59

## 2021-08-21 ENCOUNTER — Ambulatory Visit: Payer: 59 | Admitting: Student

## 2021-08-21 DIAGNOSIS — I7121 Aneurysm of the ascending aorta, without rupture: Secondary | ICD-10-CM

## 2021-08-21 DIAGNOSIS — R0609 Other forms of dyspnea: Secondary | ICD-10-CM

## 2021-08-26 ENCOUNTER — Other Ambulatory Visit: Payer: Self-pay | Admitting: Cardiology

## 2021-08-26 DIAGNOSIS — R0609 Other forms of dyspnea: Secondary | ICD-10-CM

## 2021-08-27 NOTE — Progress Notes (Signed)
Spoke with patient regarding his echo results and stress test appointment.

## 2021-09-03 ENCOUNTER — Encounter: Payer: Self-pay | Admitting: Gastroenterology

## 2021-09-03 ENCOUNTER — Ambulatory Visit: Payer: 59 | Admitting: Gastroenterology

## 2021-09-03 VITALS — BP 120/70 | HR 52 | Ht 72.0 in | Wt 245.0 lb

## 2021-09-03 DIAGNOSIS — R059 Cough, unspecified: Secondary | ICD-10-CM | POA: Diagnosis not present

## 2021-09-03 DIAGNOSIS — R0602 Shortness of breath: Secondary | ICD-10-CM | POA: Diagnosis not present
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
## 2021-09-03 NOTE — Patient Instructions (Addendum)
If you are age 71 or older, your body mass index should be between 23-30. Your Body mass index is 33.23 kg/m. If this is out of the aforementioned range listed, please consider follow up with your Primary Care Provider.  If you are age 33 or younger, your body mass index should be between 19-25. Your Body mass index is 33.23 kg/m. If this is out of the aformentioned range listed, please consider follow up with your Primary Care Provider.   ________________________________________________________  The [facility] providers would like to encourage you to use [facility] to communicate with providers for non-urgent requests or questions.  Due to long hold times on the telephone, sending your provider a message by [facility] may be a faster and more efficient way to get a response.  Please allow 48 business hours for a response.  Please remember that this is for non-urgent requests.  _______________________________________________________  Willie Dominguez have been scheduled for an Upper GI Series at [facility] Your appointment is on 09-15-2021 at 11am. Please arrive 15 minutes prior to your test for registration. Make sure not to eat or drink anything after midnight on the night before your test. If you need to reschedule, please call radiology at [phone]. ________________________________________________________________ An upper GI series uses x rays to help diagnose problems of the upper GI tract, which includes the esophagus, stomach, and duodenum. The duodenum is the first part of the small intestine. An upper GI series is conducted by a radiology technologist or a radiologist--a doctor who specializes in x-ray imaging--at a hospital or outpatient center. While sitting or standing in front of an x-ray machine, the patient drinks barium liquid, which is often white and has a chalky consistency and taste. The barium liquid coats the lining of the upper GI tract and makes signs of disease show up more clearly on x  rays. X-ray video, called fluoroscopy, is used to view the barium liquid moving through the esophagus, stomach, and duodenum. Additional x rays and fluoroscopy are performed while the patient lies on an x-ray table. To fully coat the upper GI tract with barium liquid, the technologist or radiologist may press on the abdomen or ask the patient to change position. Patients hold still in various positions, allowing the technologist or radiologist to take x rays of the upper GI tract at different angles. If a technologist conducts the upper GI series, a radiologist will later examine the images to look for problems.  Due to recent changes in healthcare laws, you may see the results of your imaging and laboratory studies on MyChart before your provider has had a chance to review them.  We understand that in some cases there may be results that are confusing or concerning to you. Not all laboratory results come back in the same time frame and the provider may be waiting for multiple results in order to interpret others.  Please give [place] 48 hours in order for your provider to thoroughly review all the results before contacting the office for clarification of your results.   This test typically takes about 1 hour to complete.  It was a pleasure to see you today!  Thank you for trusting me with your gastrointestinal care!     __________________________________________________________________

## 2021-09-04 ENCOUNTER — Ambulatory Visit: Payer: 59 | Admitting: Cardiology

## 2021-09-04 ENCOUNTER — Encounter: Payer: Self-pay | Admitting: Cardiology

## 2021-09-04 VITALS — BP 114/76 | HR 58 | Temp 98.0°F | Resp 16 | Ht 72.0 in | Wt 244.0 lb

## 2021-09-04 DIAGNOSIS — I25118 Atherosclerotic heart disease of native coronary artery with other forms of angina pectoris: Secondary | ICD-10-CM

## 2021-09-04 DIAGNOSIS — I7121 Aneurysm of the ascending aorta, without rupture: Secondary | ICD-10-CM

## 2021-09-04 DIAGNOSIS — R0609 Other forms of dyspnea: Secondary | ICD-10-CM

## 2021-09-04 NOTE — Progress Notes (Signed)
Patient referred by [name], [name], MD for exertional chest pain and dyspnea  Subjective:   Willie Dominguez, male    DOB: Apr 18, 1950, 71 y.o.   MRN: [id number]   Chief Complaint  Patient presents with   Exertional dyspnea   Coronary Artery Disease   Follow-up    4 week    HPI  71 year old Caucasian male with hypertension, hyperlipidemia, history of bilateral lower extremity DVT and PE in 01/2018, family h/o CAD,with exertional dyspnea, mild thoracic aorta anuerysm  Patient is here today with his wife. He has continued to have exertional dyspnea since 2020, and has only gotten worse since he had COVID in 01/2021. In addition, he had at least one episode of left sided chest pain during yard work, that resolved with rest. He has not had to take nitroglycerin, but has not had any recurrence since then. Pulmonary evaluation suggest nonspecific PFT pattern. He has been prescribed new inhalers by pulmonologist Dr. Erin Fulling. It appears that the patient has had worsening cough in the last few weeks. Leg edema has improved with lasix.   Prior history: Patient originally saw me in September 2020 for symptoms of shortness of breath.  His symptoms seem to have worsened since his PE in December 2019.  However, on further review, the symptoms were present even prior to that.  He never had any significant chest pain symptoms.  Patient's subsequent CT angiogram in January 2020 did not show any PE. Work-up with echocardiogram did not show any pulmonary hypertension.  Stress test was equivocal.  I had initially referred him to [facility] pulmonology, but it appears that he never heard from them.  In the meantime, given his worsening symptoms all persistent shortness of breath, angina equivalent remains in the differential.  Thus, I recommended right and left heart catheterization and pulmonary angiography.    Cath showed no evidence of pulmonary hypertension, but showed severe small PDA bifurcation  stenosis, severe stenosis and small caliber ramus, and dFR positive LAD stenosis.  He underwent successful LAD stenting with post PCI DFR showing residual nonsignificant disease distally.  Given the amount of contrast used, I decided to stage.  Intervention in the future, as clinically necessary.  04/2019: Patient had not had any improvement in her shortness of breath symptoms.  Matter-of-fact, shortness of breath has worsened in the past 1 week.  He shortness of breath is present at both rest and exertion.  He also complains of dry cough, which is new.  He does not have any fever, chills, or other infectious symptoms.  He has had occasional "twinge" in his chest that lasts for a few seconds, but also has a separate chest pressure that is constant 24X7.  Patient was seen by [facility] pulmonology in [facility] (04/2019) . His pulmonary function test indicated restrictive pattern. He was started on Spiriva. He was also seen by Rheumatology (05/2019) for ANA speckled pattern 1:320. Further workup is ongoing.   05/2019: In last four weeks, he has had one episode of chest pressure while working in the yard. His episodes of shortness of breath, cough continue. He has noticed some leg swelling.   06/2019: Since starting Spiriva, his shortness of breath and cough have had slight improvement. Shortness of breath is more prominent on walking up the hill or walking upstairs. He has not had any recurrence of chest pressure sometimes. His blood pressure is running higher than usual. He has been off Valsartan as were trying to elucidate etiology of his  cough.   10/2019:  Patient underwent successful intervention to right PDA on 10/03/2019. Unfortunately, in spite of successful LAD and RPDA PCI's, his dyspnea symptoms have not improved. He had a few sharp chest pain episodes lasting for 1-2 seconds after RPDA PCI, which completely resolved. He has since had two COVID vaccines, which made him sick He has recovered from that  as well. However, his exertional dyspnea seems to have gotten worse. He denies any exertional chest pain.    Current Outpatient Medications:    aspirin EC 81 MG tablet, Take 81 mg by mouth daily., Disp: , Rfl:    atenolol (TENORMIN) 25 MG tablet, Take 0.5 tablets (12.5 mg total) by mouth daily., Disp: 60 tablet, Rfl: 2   Budeson-Glycopyrrol-Formoterol (BREZTRI AEROSPHERE) 160-9-4.8 MCG/ACT AERO, Inhale 2 puffs into the lungs in the morning and at bedtime., Disp: 10.7 g, Rfl: 6   Cholecalciferol (VITAMIN D-3) 125 MCG (5000 UT) TABS, Take 5,000 Units by mouth daily., Disp: , Rfl:    Coenzyme Q10 (COQ10) 100 MG CAPS, Take 100 mg by mouth daily., Disp: , Rfl:    furosemide (LASIX) 20 MG tablet, Take 1 tablet (20 mg total) by mouth daily., Disp: 30 tablet, Rfl: 3   montelukast (SINGULAIR) 10 MG tablet, Take 1 tablet (10 mg total) by mouth at bedtime., Disp: 30 tablet, Rfl: 11   nitroGLYCERIN (NITROSTAT) 0.4 MG SL tablet, Place 1 tablet (0.4 mg total) under the tongue every 5 (five) minutes as needed for chest pain., Disp: 30 tablet, Rfl: 3   Omega-3 Fatty Acids (FISH OIL) 1000 MG CAPS, Take 1,000 mg by mouth daily., Disp: , Rfl:    pantoprazole (PROTONIX) 40 MG tablet, Take 1 tablet (40 mg total) by mouth daily., Disp: 30 tablet, Rfl: 6   rosuvastatin (CRESTOR) 20 MG tablet, TAKE 1 TABLET BY MOUTH EVERY DAY, Disp: 30 tablet, Rfl: 11   valsartan (DIOVAN) 80 MG tablet, Take 1 tablet (80 mg total) by mouth daily., Disp: 30 tablet, Rfl: 1   vitamin B-12 (CYANOCOBALAMIN) 1000 MCG tablet, Take 1,000 mcg by mouth daily., Disp: , Rfl:   Cardiovascular studies:  Echocardiogram 08/21/2021:  Left ventricle cavity is normal in size and wall thickness. Normal global  wall motion. Normal LV systolic function with EF 69%. Indeterminate  diastolic filling pattern.  Left atrial cavity is mildly dilated. Aneurysmal interatrial septum  without 2D or color Doppler evidence of shunting.  Structurally normal  trileaflet aortic valve.  Trace aortic regurgitation.  Mild (Grade I) mitral regurgitation.  Aortic root dilated, 3.7 cm sinus of Valsalva. Ascending aortic aneurysm  4.3 cm.  No evidence of pulmonary hypertension.  Unlike previous study in 2020, RV dilatation is not appreciated.   EKG 08/07/2021: Sinus rhythm 62 bpm Normal EKG  CTA 05/2021: 1. No findings of tracheobronchomalacia or interstitial lung disease. 2. Old granulomatous disease, as above. 3. Mild elevation of the posterior aspect of the right hemidiaphragm. 4. Aortic atherosclerosis, in addition to three-vessel coronary artery disease. Please note that although the presence of coronary artery calcium documents the presence of coronary artery disease, the severity of this disease and any potential stenosis cannot be assessed on this non-gated CT examination. Assessment for potential risk factor modification, dietary therapy or pharmacologic therapy may be warranted, if clinically indicated. 5. Ectasia of the ascending thoracic aorta (4.4 cm in diameter). Recommend annual imaging followup by CTA or MRA. This recommendation follows 2010 ACCF/AHA/AATS/ACR/ASA/SCA/SCAI/SIR/STS/SVM Guidelines for the Diagnosis and Management of Patients with Thoracic Aortic Disease.  Circulation. 2010; 121: S177-L390. Aortic aneurysm NOS (ICD10-I71.9). 6. 4 mm nonobstructive calculus in the interpolar collecting system of the right kidney.  Coronary intervention 10/03/2019: LM: Normal LAD: Patent prox LAD stent.        Mid 40% diffuse disease.        Post PCI dFR in 04/2019 was 0.95 RI: Prox 80% disease. Small caliber vessel. LCx: No significant stenosis. RCA: RPDA main branch with 90% stenosis. Another septal branch with diffuse 70% disease in small caliber vessel.        Successful cutting balloon PTCA and stent placement 2.25 X 18 mm Resolute Onyx drug-eluting stent  LHC/RHC/coronary intervention 04/18/2019: LM: Normal LAD: Prox-mid 80%  stenosis and LAD/D1 bifurcation (1,0,0). Moderate disease in prox Diag.         LAD dFR 0.87        Successful percutaneous coronary intervention prox LAD        PTCA and stent placement 2.75 X 12 mm Resolute Onyx drug-eluting stent        Post dilatation with 3.0X8 mm Orick balloon           Post PCI LAD dFR 0.95 RI: Prox 80% disease. Small caliber vessel. LCx: No significant stenosis. RCA: Distal 40%, PDA bifurcation 90% stenosis in small caliber vessel.   Right heart cath: RA: 5 mmHg RV: 27/6 mmHg PA: 26/14 mmHg, mean PA 19 mmHg PCW: 15 mmHg   If residual dyspnea symptoms, will stage PDA bifurcation stenosis stenting in future.  LE venous duplex US 03/2018: Left: Findings consistent with age indeterminate deep vein thrombosis involving the left popliteal vein, and left peroneal vein. No cystic structure found in the popliteal fossa. Compared to previous study, femoral vein and gastroc vein thrombus has  Resolved.  LE venous duplex US 01/2018: Right: Findings consistent with acute deep vein thrombosis involving the right peroneal vein. Left: Findings consistent with acute deep vein thrombosis involving the left proximal femoral vein, left gastrocnemius vein, left popliteal vein, left posterior tibial vein, and left peroneal vein.    Recent labs: 08/14/2021: Chol 116, TG 111, HDL 46, LDL 50  03/19/2021: Glucose 115, BUN/Cr 21/0.92. EGFR >60. Na/K 136/4.2.  H/H 12/37. MCV 94. Platelets 131   Review of Systems  Review of Systems  Cardiovascular:  Positive for dyspnea on exertion. Negative for chest pain, leg swelling, palpitations and syncope.  Respiratory:  Positive for cough and shortness of breath.           Vitals:   09/04/21 0933  BP: 114/76  Pulse: (!) 58  Resp: 16  Temp: 98 F (36.7 C)  SpO2: 96%     Body mass index is 33.09 kg/m. Filed Weights   09/04/21 0933  Weight: 244 lb (110.7 kg)     Objective:  Physical Exam Vitals and nursing note reviewed.   Constitutional:      General: He is not in acute distress. Neck:     Vascular: No JVD.  Cardiovascular:     Rate and Rhythm: Normal rate and regular rhythm.     Heart sounds: Normal heart sounds. No murmur heard. Pulmonary:     Effort: Pulmonary effort is normal.     Breath sounds: Normal breath sounds. No wheezing or rales.  Musculoskeletal:     Right lower leg: No edema.     Left lower leg: No edema.        Assessment & Recommendations:   71 year old Caucasian male with hypertension, hyperlipidemia, history of bilateral lower  extremity DVT and PE (01/2018), CAD, persistent dyspnea and new cough.  Exertional dyspnea: Continued to have dyspnea with recent worsening since COVID in 01/2021. No recurrent chest pain at this time.  Echocardiogram shows preserved LVEF with no wall motion abnormality.   His dyspnea has not improved with PCI to LAD and RCA. I do not anticipate ay improvement with any further PCI to small ramus branch that does have severe stenosis. I reckon his dyspnea is not due to coronary artery disease. Continue lasix 20 mg daily. I have encouraged him to touch base with his pulmonologist given his worsening cough and dyspnea symptoms. In the meantime, I also recommend cardiopulmonary exercise stress testing to distinguish between different causes of exertional dyspnea.  CAD: Continue Aspirin. Refilled atenolol. Continue statin LDL 50 (07/2021)  Thoracic aorta aneurysm: 4.4 cm on CTA (05/2021). Continue atenolol and valsartan No family h/o aortopathy. He has twin daughters, one has congenital heart block. Genetic testing for familial aortopathy, results pending. Repeat CTA in 2024  F/u in 3 months  Detailed discussion re: differential diagnoses, management options  Time spent: 45 min  [place], MD [facility] [place] Pager: [phone] Office: [phone] If no answer Cell [phone]

## 2021-09-05 ENCOUNTER — Encounter: Payer: Self-pay | Admitting: Cardiology

## 2021-09-06 ENCOUNTER — Other Ambulatory Visit: Payer: Self-pay | Admitting: Gastroenterology

## 2021-09-15 ENCOUNTER — Ambulatory Visit (HOSPITAL_COMMUNITY)
Admission: RE | Admit: 2021-09-15 | Discharge: 2021-09-15 | Disposition: A | Discharge status: Home / Self Care | Payer: 59 | Source: Ambulatory Visit | Attending: Gastroenterology | Admitting: Gastroenterology

## 2021-09-15 DIAGNOSIS — R059 Cough, unspecified: Secondary | ICD-10-CM | POA: Insufficient documentation

## 2021-09-15 DIAGNOSIS — R0602 Shortness of breath: Secondary | ICD-10-CM | POA: Insufficient documentation

## 2021-09-25 ENCOUNTER — Ambulatory Visit (HOSPITAL_COMMUNITY): Payer: 59 | Attending: Cardiology

## 2021-09-25 DIAGNOSIS — R0609 Other forms of dyspnea: Secondary | ICD-10-CM | POA: Diagnosis not present

## 2021-09-25 DIAGNOSIS — R0600 Dyspnea, unspecified: Secondary | ICD-10-CM

## 2021-09-29 ENCOUNTER — Encounter (HOSPITAL_COMMUNITY): Payer: Self-pay | Admitting: Gastroenterology

## 2021-10-02 ENCOUNTER — Other Ambulatory Visit: Payer: Self-pay

## 2021-10-05 NOTE — Anesthesia Preprocedure Evaluation (Addendum)
Anesthesia Evaluation  Patient identified by MRN, date of birth, ID band Patient awake    Reviewed: Allergy & Precautions, NPO status , Patient's Chart, lab work & pertinent test results  History of Anesthesia Complications (+) PROLONGED EMERGENCE and history of anesthetic complications  Airway Mallampati: II  TM Distance: >3 FB Neck ROM: Full    Dental no notable dental hx. (+) Dental Advisory Given, Teeth Intact   Pulmonary PE (2019)   Pulmonary exam normal breath sounds clear to auscultation       Cardiovascular hypertension, + CAD and + Cardiac Stents  Normal cardiovascular exam Rhythm:Regular Rate:Normal  08/21/2021 Echo  Echocardiogram 08/21/2021:  Left ventricle cavity is normal in size and wall thickness. Normal global  wall motion. Normal LV systolic function with EF 69%. Indeterminate  diastolic filling pattern.  Left atrial cavity is mildly dilated. Aneurysmal interatrial septum  without 2D or color Doppler evidence of shunting.  Structurally normal trileaflet aortic valve. Trace aortic regurgitation.  Mild (Grade I) mitral regurgitation.  Aortic root dilated, 3.7 cm sinus of Valsalva. Ascending aortic aneurysm  4.3 cm.  No evidence of pulmonary hypertension.  Unlike previous study in 2020, RV dilatation is not appreciated.    Neuro/Psych  Neuromuscular disease    GI/Hepatic Neg liver ROS,   Endo/Other    Renal/GU      Musculoskeletal  (+) Arthritis , Osteoarthritis,    Abdominal   Peds  Hematology   Anesthesia Other Findings All: See list  Blind R eye  Reproductive/Obstetrics                            Anesthesia Physical Anesthesia Plan  ASA: 3  Anesthesia Plan: MAC   Post-op Pain Management: Minimal or no pain anticipated   Induction: Intravenous  PONV Risk Score and Plan: TIVA, Propofol infusion and Treatment may vary due to age or medical condition  Airway  Management Planned: Natural Airway, Nasal Cannula and Simple Face Mask  Additional Equipment: None  Intra-op Plan:   Post-operative Plan:   Informed Consent: I have reviewed the patients History and Physical, chart, labs and discussed the procedure including the risks, benefits and alternatives for the proposed anesthesia with the patient or authorized representative who has indicated his/her understanding and acceptance.       Plan Discussed with:   Anesthesia Plan Comments: (History of colon polyp for colonoscopy)       Anesthesia Quick Evaluation

## 2021-10-06 ENCOUNTER — Ambulatory Visit (HOSPITAL_COMMUNITY): Payer: 59 | Admitting: Anesthesiology

## 2021-10-06 ENCOUNTER — Other Ambulatory Visit: Payer: Self-pay

## 2021-10-06 ENCOUNTER — Encounter (HOSPITAL_COMMUNITY): Payer: Self-pay | Admitting: Gastroenterology

## 2021-10-06 ENCOUNTER — Ambulatory Visit (HOSPITAL_COMMUNITY)
Admission: RE | Admit: 2021-10-06 | Discharge: 2021-10-06 | Disposition: A | Discharge status: Home / Self Care | Payer: 59 | Attending: Gastroenterology | Admitting: Gastroenterology

## 2021-10-06 ENCOUNTER — Ambulatory Visit (HOSPITAL_BASED_OUTPATIENT_CLINIC_OR_DEPARTMENT_OTHER): Payer: 59 | Admitting: Anesthesiology

## 2021-10-06 ENCOUNTER — Encounter (HOSPITAL_COMMUNITY)
Admission: RE | Disposition: A | Discharge status: Home / Self Care | Payer: Self-pay | Source: Home / Self Care | Attending: Gastroenterology

## 2021-10-06 DIAGNOSIS — I2510 Atherosclerotic heart disease of native coronary artery without angina pectoris: Secondary | ICD-10-CM | POA: Diagnosis not present

## 2021-10-06 DIAGNOSIS — D120 Benign neoplasm of cecum: Secondary | ICD-10-CM | POA: Diagnosis not present

## 2021-10-06 DIAGNOSIS — Z86010 Personal history of colonic polyps: Secondary | ICD-10-CM | POA: Insufficient documentation

## 2021-10-06 DIAGNOSIS — Q438 Other specified congenital malformations of intestine: Secondary | ICD-10-CM | POA: Insufficient documentation

## 2021-10-06 DIAGNOSIS — Z9889 Other specified postprocedural states: Secondary | ICD-10-CM | POA: Insufficient documentation

## 2021-10-06 DIAGNOSIS — Z09 Encounter for follow-up examination after completed treatment for conditions other than malignant neoplasm: Secondary | ICD-10-CM | POA: Diagnosis not present

## 2021-10-06 DIAGNOSIS — K635 Polyp of colon: Secondary | ICD-10-CM

## 2021-10-06 DIAGNOSIS — K5730 Diverticulosis of large intestine without perforation or abscess without bleeding: Secondary | ICD-10-CM

## 2021-10-06 DIAGNOSIS — K641 Second degree hemorrhoids: Secondary | ICD-10-CM | POA: Insufficient documentation

## 2021-10-06 HISTORY — PX: SUBMUCOSAL LIFTING INJECTION: SHX6855

## 2021-10-06 HISTORY — PX: POLYPECTOMY: SHX5525

## 2021-10-06 HISTORY — PX: ENDOSCOPIC MUCOSAL RESECTION: SHX6839

## 2021-10-06 HISTORY — PX: HEMOSTASIS CLIP PLACEMENT: SHX6857

## 2021-10-06 HISTORY — PX: COLONOSCOPY WITH PROPOFOL: SHX5780

## 2021-10-06 HISTORY — PX: HOT HEMOSTASIS: SHX5433

## 2021-10-06 SURGERY — COLONOSCOPY WITH PROPOFOL
Anesthesia: Monitor Anesthesia Care

## 2021-10-06 MED ORDER — PROPOFOL 1000 MG/100ML IV EMUL
INTRAVENOUS | Status: AC
  Filled 2021-10-06: qty 100

## 2021-10-06 MED ORDER — PROPOFOL 10 MG/ML IV BOLUS
INTRAVENOUS | Status: DC | PRN
  Administered 2021-10-06 (×2): 50 mg via INTRAVENOUS

## 2021-10-06 MED ORDER — PROPOFOL 500 MG/50ML IV EMUL
INTRAVENOUS | Status: DC | PRN
  Administered 2021-10-06: 110 ug/kg/min via INTRAVENOUS

## 2021-10-06 MED ORDER — PROPOFOL 500 MG/50ML IV EMUL
INTRAVENOUS | Status: AC
  Filled 2021-10-06: qty 50

## 2021-10-06 MED ORDER — LACTATED RINGERS IV SOLN: INTRAVENOUS | Status: DC | PRN

## 2021-10-06 MED ORDER — LACTATED RINGERS IV SOLN
INTRAVENOUS | Status: DC
  Administered 2021-10-06: 1000 mL via INTRAVENOUS

## 2021-10-06 MED ORDER — SODIUM CHLORIDE 0.9 % IV SOLN: INTRAVENOUS | Status: DC

## 2021-10-06 SURGICAL SUPPLY — 22 items
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
## 2021-10-06 NOTE — Anesthesia Postprocedure Evaluation (Signed)
Anesthesia Post Note  Patient: Willie Dominguez  Procedure(s) Performed: COLONOSCOPY WITH PROPOFOL ENDOSCOPIC MUCOSAL RESECTION SUBMUCOSAL LIFTING INJECTION POLYPECTOMY HEMOSTASIS CLIP PLACEMENT HOT HEMOSTASIS (ARGON PLASMA COAGULATION/BICAP)     Patient location during evaluation: Endoscopy Anesthesia Type: MAC Level of consciousness: awake and alert Pain management: pain level controlled Vital Signs Assessment: post-procedure vital signs reviewed and stable Respiratory status: spontaneous breathing, nonlabored ventilation, respiratory function stable and patient connected to nasal cannula oxygen Cardiovascular status: blood pressure returned to baseline and stable Postop Assessment: no apparent nausea or vomiting Anesthetic complications: no   No notable events documented.  Last Vitals:  Vitals:   10/06/21 1420 10/06/21 1430  BP: 137/75 (!) 151/77  Pulse: (!) 50 (!) 51  Resp: 13 12  Temp:    SpO2: 99% 98%    Last Pain:  Vitals:   10/06/21 1430  TempSrc:   PainSc: 0-No pain                 Barnet Glasgow

## 2021-10-06 NOTE — Op Note (Signed)
[facility] Patient Name: Willie Dominguez Procedure Date: 10/06/2021 MRN: [id number] Attending MD: Justice Britain , MD Date of Birth: 1951-02-11 CSN: [id number] Age: 71 Admit Type: Outpatient Procedure:                Colonoscopy Indications:              Surveillance: History of piecemeal removal adenoma                            on last colonoscopy (< 3 yrs) - long history of                            previous resection endoscopically at [facility] and then                            recurrence in 2021 with subsequent Endorotor in 2021 Providers:                Justice Britain, MD, Doristine Johns, RN,                            Summer Dalton, Technician Referring MD:             Estill Cotta. Loletha Carrow, MD, [name]. [name] Medicines:                Monitored Anesthesia Care Complications:            No immediate complications. Estimated Blood Loss:     Estimated blood loss was minimal. Procedure:                Pre-Anesthesia Assessment:                           - Prior to the procedure, a History and Physical                            was performed, and patient medications and                            allergies were reviewed. The patient's tolerance of                            previous anesthesia was also reviewed. The risks                            and benefits of the procedure and the sedation                            options and risks were discussed with the patient.                            All questions were answered, and informed consent                            was obtained. Prior Anticoagulants: The patient has  taken no previous anticoagulant or antiplatelet                            agents. ASA Grade Assessment: III - A patient with                            severe systemic disease. After reviewing the risks                            and benefits, the patient was deemed in                            satisfactory  condition to undergo the procedure.                           After obtaining informed consent, the colonoscope                            was passed under direct vision. Throughout the                            procedure, the patient's blood pressure, pulse, and                            oxygen saturations were monitored continuously. The                            CF-HQ190L ([id number]) Olympus colonoscope was                            introduced through the anus and advanced to the the                            cecum, identified by appendiceal orifice and                            ileocecal valve. The colonoscopy was somewhat                            difficult due to significant looping. Successful                            completion of the procedure was aided by changing                            the patient's position, using manual pressure,                            straightening and shortening the scope to obtain                            bowel loop reduction and using scope torsion. The  patient tolerated the procedure. The quality of the                            bowel preparation was adequate. The ileocecal                            valve, appendiceal orifice, and rectum were                            photographed. Scope In: 1:00:48 PM Scope Out: 1:48:45 PM Scope Withdrawal Time: 0 hours 42 minutes 23 seconds  Total Procedure Duration: 0 hours 47 minutes 57 seconds  Findings:      The digital rectal exam findings include hemorrhoids. Pertinent       negatives include no palpable rectal lesions.      Many small-mouthed diverticula were found in the recto-sigmoid colon,       sigmoid colon and descending colon.      The left colon was significantly tortuous due to diverticular changes.      A large post mucosectomy scar was found in the cecum. There was       unfortunately, recurrent polyp tissue that has grown on the scar.        Preparations were made for attempt at further resection. NBI imaging and       White-light endoscopy was done to demarcate the borders of the lesion.       Everlift was injected to attempt to raise the lesion (partially       successful). Piecemeal mucosal resection using a snare was performed.       Resection was incomplete. The resected tissue was retrieved. Fulguration       to ablate the lesion remnants by forcep avulsion was successful. For       additional attempt at salvage, coagulation for tissue destruction using       argon plasma was successful (Right colon settings) to the margin/base of       the resection. To prevent bleeding post-intervention, four hemostatic       clips were successfully placed (MR conditional). There was no bleeding       at the end of the procedure.      Normal mucosa was found in the entire colon otherwise.      Non-bleeding non-thrombosed internal hemorrhoids were found during       retroflexion, during perianal exam and during digital exam. The       hemorrhoids were Grade II (internal hemorrhoids that prolapse but reduce       spontaneously).      Attempt at use of OVESCO in future was considered, I could not get the       PROVE-IT cap to pass beyond the sigmoid colon. Query potential use of       the diagnostic FTRD (which has a small cap) in future, or getting the       Upper FTRD if necessary. Impression:               - Hemorrhoids found on digital rectal exam.                           - Tortuous left colon.                           -  Post mucosectomy scar in the cecum with recurrent                            polyp present. Treated with partial mucosal                            resection, forcep avulsion. Then baseand margins                            treated with argon plasma coagulation (APC). Clips                            (MR conditional) were placed.                           - Diverticulosis in the recto-sigmoid colon, in the                             sigmoid colon and in the descending colon. This                            prevents PROVE-IT cap from passing unfortunately.                           - Normal mucosa in the entire examined colon                            otherwise.                           - Non-bleeding non-thrombosed internal hemorrhoids. Moderate Sedation:      Not Applicable - Patient had care per Anesthesia. Recommendation:           - The patient will be observed post-procedure,                            until all discharge criteria are met.                           - Discharge patient to home.                           - Patient has a contact number available for                            emergencies. The signs and symptoms of potential                            delayed complications were discussed with the                            patient. Return to normal activities tomorrow.                            Written discharge instructions were provided to the  patient.                           - High fiber diet.                           - Use FiberCon 1-2 tablets PO daily.                           - No aspirin, ibuprofen, naproxen, or other                            non-steroidal anti-inflammatory drugs for 2 weeks                            after polyp removal.                           - Continue present medications.                           - Await pathology results.                           - Repeat colonoscopy in 6-9 months for                            surveillance. Considerations as noted above about                            FTRD. Surgical options will need to be considered                            if this continues to be an issue.                           - The findings and recommendations were discussed                            with the patient.                           - The findings and recommendations were discussed                             with the patient's family. Procedure Code(s):        --- Professional ---                           [phone], Colonoscopy, flexible; with endoscopic                            mucosal resection Diagnosis Code(s):        --- Professional ---                           Z86.010, Personal history of colonic polyps  K64.1, Second degree hemorrhoids                           Z98.890, Other specified postprocedural states                           K57.30, Diverticulosis of large intestine without                            perforation or abscess without bleeding                           Q43.8, Other specified congenital malformations of                            intestine CPT copyright 2019 [employer]. All rights reserved. The codes documented in this report are preliminary and upon coder review may  be revised to meet current compliance requirements. Justice Britain, MD 10/06/2021 2:18:56 PM Number of Addenda: 0

## 2021-10-06 NOTE — Transfer of Care (Signed)
Immediate Anesthesia Transfer of Care Note  Patient: GARNER DULLEA  Procedure(s) Performed: COLONOSCOPY WITH PROPOFOL ENDOSCOPIC MUCOSAL RESECTION SUBMUCOSAL LIFTING INJECTION POLYPECTOMY HEMOSTASIS CLIP PLACEMENT HOT HEMOSTASIS (ARGON PLASMA COAGULATION/BICAP)  Patient Location: PACU  Anesthesia Type:MAC  Level of Consciousness: awake, drowsy and patient cooperative  Airway & Oxygen Therapy: Patient Spontanous Breathing and Patient connected to face mask oxygen  Post-op Assessment: Report given to RN, Post -op Vital signs reviewed and stable and Patient moving all extremities X 4  Post vital signs: Reviewed and stable  Last Vitals:  Vitals Value Taken Time  BP    Temp    Pulse 55 10/06/21 1356  Resp 17 10/06/21 1356  SpO2 100 % 10/06/21 1356  Vitals shown include unvalidated device data.  Last Pain:  Vitals:   10/06/21 1221  TempSrc: Oral  PainSc: 0-No pain         Complications: No notable events documented.

## 2021-10-06 NOTE — Discharge Instructions (Signed)
YOU HAD AN ENDOSCOPIC PROCEDURE TODAY: Refer to the procedure report and other information in the discharge instructions given to you for any specific questions about what was found during the examination. If this information does not answer your questions, please call [place] office at [phone] to clarify.   YOU SHOULD EXPECT: Some feelings of bloating in the abdomen. Passage of more gas than usual. Walking can help get rid of the air that was put into your GI tract during the procedure and reduce the bloating. If you had a lower endoscopy (such as a colonoscopy or flexible sigmoidoscopy) you may notice spotting of blood in your stool or on the toilet paper. Some abdominal soreness may be present for a day or two, also.  DIET: Your first meal following the procedure should be a light meal and then it is ok to progress to your normal diet. A half-sandwich or bowl of soup is an example of a good first meal. Heavy or fried foods are harder to digest and may make you feel nauseous or bloated. Drink plenty of fluids but you should avoid alcoholic beverages for 24 hours. If you had a esophageal dilation, please see attached instructions for diet.    ACTIVITY: Your care partner should take you home directly after the procedure. You should plan to take it easy, moving slowly for the rest of the day. You can resume normal activity the day after the procedure however YOU SHOULD NOT DRIVE, use power tools, machinery or perform tasks that involve climbing or major physical exertion for 24 hours (because of the sedation medicines used during the test).   SYMPTOMS TO REPORT IMMEDIATELY: A gastroenterologist can be reached at any hour. Please call [phone]  for any of the following symptoms:   Following lower endoscopy (colonoscopy) Excessive amounts of blood in the stool  Significant tenderness, worsening of abdominal pains  Swelling of the abdomen that is new, acute  Fever of 100 or higher   FOLLOW UP:   If any biopsies were taken you will be contacted by phone or by letter within the next 1-3 weeks. Call [phone]  if you have not heard about the biopsies in 3 weeks.  Please also call with any specific questions about appointments or follow up tests.

## 2021-10-07 ENCOUNTER — Encounter (HOSPITAL_COMMUNITY): Payer: Self-pay | Admitting: Gastroenterology

## 2021-10-07 LAB — SURGICAL PATHOLOGY

## 2021-10-08 ENCOUNTER — Encounter: Payer: Self-pay | Admitting: Gastroenterology

## 2021-10-13 ENCOUNTER — Other Ambulatory Visit: Payer: Self-pay | Admitting: Cardiology

## 2021-10-13 DIAGNOSIS — I10 Essential (primary) hypertension: Secondary | ICD-10-CM

## 2021-11-07 ENCOUNTER — Encounter: Payer: Self-pay | Admitting: Pulmonary Disease

## 2021-11-07 ENCOUNTER — Telehealth: Payer: Self-pay | Admitting: Pulmonary Disease

## 2021-11-07 ENCOUNTER — Inpatient Hospital Stay (HOSPITAL_COMMUNITY)
Admission: EM | Admit: 2021-11-07 | Discharge: 2021-11-10 | DRG: 176 | Disposition: A | Discharge status: Home / Self Care | Payer: 59 | Attending: Internal Medicine | Admitting: Internal Medicine

## 2021-11-07 ENCOUNTER — Encounter (HOSPITAL_COMMUNITY): Payer: Self-pay

## 2021-11-07 ENCOUNTER — Ambulatory Visit (HOSPITAL_BASED_OUTPATIENT_CLINIC_OR_DEPARTMENT_OTHER)
Admission: RE | Admit: 2021-11-07 | Discharge: 2021-11-07 | Disposition: A | Discharge status: Home / Self Care | Payer: 59 | Source: Ambulatory Visit | Attending: Pulmonary Disease | Admitting: Pulmonary Disease

## 2021-11-07 ENCOUNTER — Ambulatory Visit (HOSPITAL_COMMUNITY)
Admission: RE | Admit: 2021-11-07 | Discharge: 2021-11-07 | Disposition: A | Discharge status: Home / Self Care | Payer: 59 | Source: Ambulatory Visit | Attending: Pulmonary Disease | Admitting: Pulmonary Disease

## 2021-11-07 ENCOUNTER — Other Ambulatory Visit: Payer: Self-pay

## 2021-11-07 ENCOUNTER — Ambulatory Visit: Payer: 59 | Admitting: Pulmonary Disease

## 2021-11-07 VITALS — BP 120/80 | HR 48 | Ht 72.0 in | Wt 246.4 lb

## 2021-11-07 DIAGNOSIS — I82411 Acute embolism and thrombosis of right femoral vein: Secondary | ICD-10-CM

## 2021-11-07 DIAGNOSIS — I25118 Atherosclerotic heart disease of native coronary artery with other forms of angina pectoris: Secondary | ICD-10-CM | POA: Diagnosis not present

## 2021-11-07 DIAGNOSIS — I712 Thoracic aortic aneurysm, without rupture, unspecified: Secondary | ICD-10-CM | POA: Diagnosis present

## 2021-11-07 DIAGNOSIS — Z801 Family history of malignant neoplasm of trachea, bronchus and lung: Secondary | ICD-10-CM | POA: Diagnosis not present

## 2021-11-07 DIAGNOSIS — Z7951 Long term (current) use of inhaled steroids: Secondary | ICD-10-CM

## 2021-11-07 DIAGNOSIS — D696 Thrombocytopenia, unspecified: Secondary | ICD-10-CM | POA: Diagnosis present

## 2021-11-07 DIAGNOSIS — Z7982 Long term (current) use of aspirin: Secondary | ICD-10-CM | POA: Diagnosis not present

## 2021-11-07 DIAGNOSIS — Z86711 Personal history of pulmonary embolism: Secondary | ICD-10-CM

## 2021-11-07 DIAGNOSIS — I2510 Atherosclerotic heart disease of native coronary artery without angina pectoris: Secondary | ICD-10-CM | POA: Diagnosis present

## 2021-11-07 DIAGNOSIS — Z79899 Other long term (current) drug therapy: Secondary | ICD-10-CM | POA: Diagnosis not present

## 2021-11-07 DIAGNOSIS — N400 Enlarged prostate without lower urinary tract symptoms: Secondary | ICD-10-CM | POA: Diagnosis present

## 2021-11-07 DIAGNOSIS — Z6833 Body mass index (BMI) 33.0-33.9, adult: Secondary | ICD-10-CM | POA: Diagnosis not present

## 2021-11-07 DIAGNOSIS — Z86718 Personal history of other venous thrombosis and embolism: Secondary | ICD-10-CM | POA: Diagnosis not present

## 2021-11-07 DIAGNOSIS — Z825 Family history of asthma and other chronic lower respiratory diseases: Secondary | ICD-10-CM | POA: Diagnosis not present

## 2021-11-07 DIAGNOSIS — I10 Essential (primary) hypertension: Secondary | ICD-10-CM | POA: Diagnosis present

## 2021-11-07 DIAGNOSIS — I2699 Other pulmonary embolism without acute cor pulmonale: Principal | ICD-10-CM | POA: Diagnosis present

## 2021-11-07 DIAGNOSIS — I7121 Aneurysm of the ascending aorta, without rupture: Secondary | ICD-10-CM

## 2021-11-07 DIAGNOSIS — Z8249 Family history of ischemic heart disease and other diseases of the circulatory system: Secondary | ICD-10-CM | POA: Diagnosis not present

## 2021-11-07 DIAGNOSIS — R001 Bradycardia, unspecified: Secondary | ICD-10-CM | POA: Diagnosis not present

## 2021-11-07 DIAGNOSIS — R600 Localized edema: Secondary | ICD-10-CM

## 2021-11-07 DIAGNOSIS — R0602 Shortness of breath: Secondary | ICD-10-CM | POA: Diagnosis not present

## 2021-11-07 DIAGNOSIS — R059 Cough, unspecified: Secondary | ICD-10-CM | POA: Diagnosis not present

## 2021-11-07 DIAGNOSIS — E669 Obesity, unspecified: Secondary | ICD-10-CM | POA: Diagnosis present

## 2021-11-07 DIAGNOSIS — I82431 Acute embolism and thrombosis of right popliteal vein: Secondary | ICD-10-CM | POA: Diagnosis present

## 2021-11-07 DIAGNOSIS — Z833 Family history of diabetes mellitus: Secondary | ICD-10-CM | POA: Diagnosis not present

## 2021-11-07 DIAGNOSIS — I2609 Other pulmonary embolism with acute cor pulmonale: Secondary | ICD-10-CM

## 2021-11-07 LAB — CBC
HCT: 40.6 % (ref 39.0–52.0)
Hemoglobin: 13 g/dL (ref 13.0–17.0)
MCH: 30.5 pg (ref 26.0–34.0)
MCHC: 32 g/dL (ref 30.0–36.0)
MCV: 95.3 fL (ref 80.0–100.0)
Platelets: 133 10*3/uL — ABNORMAL LOW (ref 150–400)
RBC: 4.26 MIL/uL (ref 4.22–5.81)
RDW: 14.6 % (ref 11.5–15.5)
WBC: 5.9 10*3/uL (ref 4.0–10.5)
nRBC: 0 % (ref 0.0–0.2)

## 2021-11-07 LAB — LACTIC ACID, PLASMA: Lactic Acid, Venous: 0.9 mmol/L (ref 0.5–1.9)

## 2021-11-07 LAB — BASIC METABOLIC PANEL
Anion gap: 10 (ref 5–15)
BUN: 18 mg/dL (ref 8–23)
CO2: 23 mmol/L (ref 22–32)
Calcium: 9.1 mg/dL (ref 8.9–10.3)
Chloride: 103 mmol/L (ref 98–111)
Creatinine, Ser: 1.09 mg/dL (ref 0.61–1.24)
GFR, Estimated: >60 mL/min (ref >60)
Glucose, Bld: 97 mg/dL (ref 70–99)
Potassium: 4.2 mmol/L (ref 3.5–5.1)
Sodium: 136 mmol/L (ref 135–145)

## 2021-11-07 LAB — APTT: aPTT: 26 seconds (ref 24–36)

## 2021-11-07 LAB — PROTIME-INR
INR: 1.1 (ref 0.8–1.2)
Prothrombin Time: 13.9 seconds (ref 11.4–15.2)

## 2021-11-07 LAB — TROPONIN I (HIGH SENSITIVITY): Troponin I (High Sensitivity): 4 ng/L (ref <18)

## 2021-11-07 LAB — BRAIN NATRIURETIC PEPTIDE: B Natriuretic Peptide: 35.7 pg/mL (ref 0.0–100.0)

## 2021-11-07 MED ORDER — HEPARIN BOLUS VIA INFUSION
3000.0000 [IU] | Freq: Once | INTRAVENOUS | Status: AC
Start: 2021-11-07 — End: 2021-11-07
  Administered 2021-11-07: 3000 [IU] via INTRAVENOUS
  Filled 2021-11-07: qty 3000

## 2021-11-07 MED ORDER — FLUTICASONE PROPIONATE 50 MCG/ACT NA SUSP: 1.0000 | Freq: Every day | NASAL | 2 refills | Status: DC

## 2021-11-07 MED ORDER — POLYETHYLENE GLYCOL 3350 17 G PO PACK
17.0000 g | PACK | Freq: Every day | ORAL | Status: DC | PRN
  Administered 2021-11-09: 17 g via ORAL
  Filled 2021-11-07: qty 1

## 2021-11-07 MED ORDER — BUDESON-GLYCOPYRROL-FORMOTEROL 160-9-4.8 MCG/ACT IN AERO: 2.0000 | INHALATION_SPRAY | Freq: Two times a day (BID) | RESPIRATORY_TRACT | Status: DC

## 2021-11-07 MED ORDER — IPRATROPIUM BROMIDE 0.03 % NA SOLN: 2.0000 | Freq: Two times a day (BID) | NASAL | 12 refills | Status: DC

## 2021-11-07 MED ORDER — MONTELUKAST SODIUM 10 MG PO TABS
10.0000 mg | ORAL_TABLET | Freq: Every day | ORAL | Status: DC
  Administered 2021-11-08 – 2021-11-09 (×2): 10 mg via ORAL
  Filled 2021-11-07 (×2): qty 1

## 2021-11-07 MED ORDER — ACETAMINOPHEN 325 MG PO TABS
650.0000 mg | ORAL_TABLET | Freq: Four times a day (QID) | ORAL | Status: DC | PRN
  Administered 2021-11-08 – 2021-11-09 (×2): 650 mg via ORAL
  Filled 2021-11-07 (×2): qty 2

## 2021-11-07 MED ORDER — ACETAMINOPHEN 650 MG RE SUPP: 650.0000 mg | Freq: Four times a day (QID) | RECTAL | Status: DC | PRN

## 2021-11-07 MED ORDER — APIXABAN (ELIQUIS) VTE STARTER PACK (10MG AND 5MG): ORAL_TABLET | ORAL | 0 refills | Status: DC

## 2021-11-07 MED ORDER — IRBESARTAN 75 MG PO TABS
75.0000 mg | ORAL_TABLET | Freq: Every day | ORAL | Status: DC
  Administered 2021-11-08 – 2021-11-10 (×3): 75 mg via ORAL
  Filled 2021-11-07 (×3): qty 1

## 2021-11-07 MED ORDER — HEPARIN (PORCINE) 25000 UT/250ML-% IV SOLN
1600.0000 [IU]/h | INTRAVENOUS | Status: DC
  Administered 2021-11-07 – 2021-11-08 (×3): 1650 [IU]/h via INTRAVENOUS
  Administered 2021-11-09: 1600 [IU]/h via INTRAVENOUS
  Administered 2021-11-09: 1650 [IU]/h via INTRAVENOUS
  Filled 2021-11-07 (×5): qty 250

## 2021-11-07 MED ORDER — IOHEXOL 350 MG/ML SOLN
100.0000 mL | Freq: Once | INTRAVENOUS | Status: AC | PRN
  Administered 2021-11-07: 80 mL via INTRAVENOUS

## 2021-11-07 MED ORDER — ONDANSETRON HCL 4 MG/2ML IJ SOLN: 4.0000 mg | Freq: Four times a day (QID) | INTRAMUSCULAR | Status: DC | PRN

## 2021-11-07 MED ORDER — ROSUVASTATIN CALCIUM 20 MG PO TABS
20.0000 mg | ORAL_TABLET | Freq: Every day | ORAL | Status: DC
  Administered 2021-11-08 – 2021-11-10 (×3): 20 mg via ORAL
  Filled 2021-11-07 (×3): qty 1

## 2021-11-07 MED ORDER — SODIUM CHLORIDE 0.9% FLUSH
3.0000 mL | Freq: Two times a day (BID) | INTRAVENOUS | Status: DC
  Administered 2021-11-07 – 2021-11-10 (×5): 3 mL via INTRAVENOUS

## 2021-11-07 MED ORDER — ONDANSETRON HCL 4 MG PO TABS: 4.0000 mg | ORAL_TABLET | Freq: Four times a day (QID) | ORAL | Status: DC | PRN

## 2021-11-07 NOTE — ED Triage Notes (Signed)
Pt was sent for a ct today and it revealed that he had a pulmonary embolism.
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
## 2021-11-07 NOTE — Telephone Encounter (Signed)
JD received a call about pt's doppler study and it stated that pt was positive for a DVT on the right. JD stated that he was going to send Rx to pharmacy for pt to begin Eliquis. Pt also needs to have a STAT CT chest to make sure there was not a PE in lungs.   Attempted to call pt to let him know this info but unable to reach. Left message for pt to return call. Attempted to call pt's spouse [name] to relay the info to her but unable to reach. Left message for her to return call.

## 2021-11-07 NOTE — Telephone Encounter (Signed)
Received call from radiology team about the CTA PE study which was positive for right sided pulmonary emboli with concern for right heart strain.   I spoke with patient's wife. He has not started eliquis that was sent in earlier. I recommended they go to the [facility] for admission to the hospital and initiation of heparin drip as well as further workup and monitoring. She expressed understanding.  I notified the [facility] team about the patient's arrival.   Freda Jackson, MD [place] Office: [phone]   See Amion for personal pager PCCM on call pager [phone] until 7pm. Please call [facility] 7p-7a. [phone]

## 2021-11-07 NOTE — Progress Notes (Signed)
Lower extremity venous has been completed.   Preliminary results in CV Proc.   Willie Dominguez 11/07/2021 2:22 PM

## 2021-11-07 NOTE — Patient Instructions (Addendum)
We will check lower extremity ultra sound to rule out blood clots in your legs  We will schedule you for a home sleep study  I will contact your cardiologist about stopping your atenolol for your heart rate  Continue on breztri 2 puffs twice daily  - rinse mouth out after each use  Continue fexofenadine and montelukast daily  Start fluticasone 1 spray per nostril daily  Start ipratropium nasal spray 2 sprays per nostril twice daily  Follow up in 3 months

## 2021-11-07 NOTE — ED Provider Triage Note (Signed)
Emergency Medicine Provider Triage Evaluation Note  Willie Dominguez , a 71 y.o. male  was evaluated in triage.  Pt complains of "PE" seems no significant new sx today. Hx of PE/DVT. Sent by pulm for PE w heart strain.  Review of Systems  Positive: SOB Negative: Fever   Physical Exam  BP 137/81   Pulse (!) 50   Temp 98.4 F (36.9 C) (Oral)   Resp 16   SpO2 95%  Gen:   Awake, no distress   Resp:  Normal effort MSK:   Moves extremities without difficulty  Other:  RLE swelling  Medical Decision Making  Medically screening exam initiated at 7:24 PM.  Appropriate orders placed.  Willie Dominguez was informed that the remainder of the evaluation will be completed by another provider, this initial triage assessment does not replace that evaluation, and the importance of remaining in the ED until their evaluation is complete.  Needs immediate room placement.  PE workup ordered.    Willie Dominguez [place], [place] 11/07/21 1925
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
## 2021-11-07 NOTE — ED Provider Notes (Signed)
[facility] DEPT Provider Note   CSN: [id number] Arrival date & time: 11/07/21  1902     History  Chief Complaint  Patient presents with   pulmonary embolism    Willie Dominguez is a 71 y.o. male.never smoker with history of hypertension and blood clots who returned to pulmonary clinic for cough and shortness of breath after covid 19 infection with [facility] pulmonology.  He was sent for a CTA scan which showed evidence of a submassive Pe and right heart strain.  He was also recently diagnosed with a DVT.  He was not on blood thinners since 2019 when he was last diagnosed with blood clots.  He has had no syncopal episodes, no active chest pain, no active current dyspnea at rest.  He just noted increased swelling in his legs but no pain with it.  No fevers or infectious symptoms.  No history of GI bleed or head bleed or CVA.  He only notes dyspnea on exertion but none at rest.  No hemoptysis.  He took atenolol this morning which is part of his home meds.  HPI     Home Medications Prior to Admission medications   Medication Sig Start Date End Date Taking? Authorizing Provider  APIXABAN (ELIQUIS) VTE STARTER PACK ('10MG'$  AND '5MG'$ ) Take as directed on package: start with two-'5mg'$  tablets twice daily for 7 days. On day 8, switch to one-'5mg'$  tablet twice daily. 11/07/21   Freddi Starr, MD  aspirin EC 81 MG tablet Take 81 mg by mouth daily.    [provider]  atenolol (TENORMIN) 25 MG tablet Take 0.5 tablets (12.5 mg total) by mouth daily. 08/07/21   Patwardhan, Reynold Bowen, MD  Budeson-Glycopyrrol-Formoterol (BREZTRI AEROSPHERE) 160-9-4.8 MCG/ACT AERO Inhale 2 puffs into the lungs in the morning and at bedtime. 08/04/21   Freddi Starr, MD  Cholecalciferol (VITAMIN D-3) 125 MCG (5000 UT) TABS Take 5,000 Units by mouth daily.    [provider]  Coenzyme Q10 (COQ10) 100 MG CAPS Take 100 mg by mouth daily.    [provider]  fluticasone  (FLONASE) 50 MCG/ACT nasal spray Place 1 spray into both nostrils daily. 11/07/21   Freddi Starr, MD  furosemide (LASIX) 20 MG tablet Take 1 tablet (20 mg total) by mouth daily. 08/07/21 12/05/21  Patwardhan, Reynold Bowen, MD  ipratropium (ATROVENT) 0.03 % nasal spray Place 2 sprays into both nostrils every 12 (twelve) hours. 11/07/21   Freddi Starr, MD  montelukast (SINGULAIR) 10 MG tablet Take 1 tablet (10 mg total) by mouth at bedtime. 08/04/21   Freddi Starr, MD  nitroGLYCERIN (NITROSTAT) 0.4 MG SL tablet Place 1 tablet (0.4 mg total) under the tongue every 5 (five) minutes as needed for chest pain. 01/04/19 09/30/21  Patwardhan, Reynold Bowen, MD  Omega-3 Fatty Acids (FISH OIL) 1000 MG CAPS Take 1,000 mg by mouth daily.    [provider]  polyethylene glycol-electrolytes (NULYTELY) 420 g solution TAKE AS DIRECTED 09/08/21   Mansouraty, Telford Nab., MD  rosuvastatin (CRESTOR) 20 MG tablet TAKE 1 TABLET BY MOUTH EVERY DAY 05/12/21   Patwardhan, Manish J, MD  valsartan (DIOVAN) 80 MG tablet TAKE 1 TABLET BY MOUTH EVERY DAY 10/13/21   Patwardhan, Manish J, MD  vitamin B-12 (CYANOCOBALAMIN) 1000 MCG tablet Take 1,000 mcg by mouth daily.    [provider]      Allergies    Methotrexate derivatives, Doxazosin, Lipitor [atorvastatin], Lisinopril, Penicillins, Bystolic [nebivolol hcl], and Simvastatin  Review of Systems   Review of Systems  Physical Exam Updated Vital Signs BP 139/76   Pulse (!) 47   Temp 98.7 F (37.1 C) (Oral)   Resp 16   SpO2 97%  Physical Exam Constitutional: Alert and oriented. Well appearing and in no distress. Eyes: Conjunctivae are normal. ENT      Head: Normocephalic and atraumatic.      Nose: No congestion.      Mouth/Throat: Mucous membranes are moist.      Neck: No stridor. Cardiovascular: S1, S2, bradycardic, regular rhythm, warm well perfused. Respiratory: Normal respiratory effort. Breath sounds are normal.  O2 sat 95 to 100% on room  air. Gastrointestinal: Soft and nontender. Musculoskeletal: Normal range of motion in all extremities. Bilateral lower extremity extending from ankle to knees worse on the right lower extremity. Neurologic: Normal speech and language. No gross focal neurologic deficits are appreciated. Skin: Skin is warm, dry and intact. No rash noted. Psychiatric: Mood and affect are normal. Speech and behavior are normal.  ED Results / Procedures / Treatments   Labs (all labs ordered are listed, but only abnormal results are displayed) Labs Reviewed  CBC - Abnormal; Notable for the following components:      Result Value   Platelets 133 (*)    All other components within normal limits  BRAIN NATRIURETIC PEPTIDE  PROTIME-INR  BASIC METABOLIC PANEL  APTT  LACTIC ACID, PLASMA  HEPARIN LEVEL (UNFRACTIONATED)  CBC  TROPONIN I (HIGH SENSITIVITY)    EKG EKG Interpretation  Date/Time:  Friday November 07 2021 19:43:49 EDT Ventricular Rate:  48 PR Interval:  211 QRS Duration: 101 QT Interval:  462 QTC Calculation: 413 R Axis:   28 Text Interpretation: Sinus bradycardia Minimal ST elevation, inferior leads No significant change since last tracing Confirmed by Georgina Snell [phone]) on 11/07/2021 7:47:11 PM  Radiology CT Angio Chest Pulmonary Embolism (PE) W or WO Contrast  Result Date: 11/07/2021 CLINICAL DATA:  Shortness of breath. Lower extremity DVT. High probability for pulmonary embolism. EXAM: CT ANGIOGRAPHY CHEST WITH CONTRAST TECHNIQUE: Multidetector CT imaging of the chest was performed using the standard protocol during bolus administration of intravenous contrast. Multiplanar CT image reconstructions and MIPs were obtained to evaluate the vascular anatomy. RADIATION DOSE REDUCTION: This exam was performed according to the departmental dose-optimization program which includes automated exposure control, adjustment of the mA and/or kV according to patient size and/or use of iterative  reconstruction technique. CONTRAST:  12m OMNIPAQUE IOHEXOL 350 MG/ML SOLN COMPARISON:  Noncontrast chest CT on 06/13/2021 FINDINGS: Cardiovascular: Acute pulmonary embolism is seen within the distal right pulmonary artery, as well as the right upper and lower lobar pulmonary arteries. No left-sided pulmonary emboli are identified. Elevated RV LV ratio of 1.35 is seen, consistent with right heart strain. 4.2 cm ascending thoracic aortic aneurysm is noted. No evidence of thoracic aortic dissection. Mediastinum/Nodes: No masses or pathologically enlarged lymph nodes identified. Lungs/Pleura: No pulmonary mass, infiltrate, or effusion. Upper abdomen: No acute findings. Musculoskeletal: No suspicious bone lesions identified. Review of the MIP images confirms the above findings. IMPRESSION: Positive for acute PE with CT evidence of right heart strain (RV/LV Ratio = 1.35) consistent with at least submassive (intermediate risk) PE. The presence of right heart strain has been associated with an increased risk of morbidity and mortality. Please refer to the "Code PE Focused" order set in EPIC. 4.2 cm ascending thoracic aortic aneurysm. Recommend annual imaging followup by CT. This recommendation follows 2010 ACCF/AHA/AATS/ACR/ASA/SCA/SCAI/SIR/STS/SVM Guidelines for  the Diagnosis and Management of Patients with Thoracic Aortic Disease. Circulation. 2010; 121: T245-Y099. Aortic aneurysm NOS (ICD10-I71.9) Critical Value/emergent results were called by telephone at the time of interpretation on 11/07/2021 at 6:34 pm to provider [facility] , who verbally acknowledged these results. Electronically Signed   By: Marlaine Hind M.D.   On: 11/07/2021 18:45   VAS [place] LOWER EXTREMITY VENOUS (DVT)  Result Date: 11/07/2021  Lower Venous DVT Study Patient Name:  RAESEAN BARTOLETTI Mcfarland  Date of Exam:   11/07/2021 Medical Rec #: [id number]         Accession #:    [id number] Date of Birth: 15-Nov-1950        Patient Gender: M Patient Age:   54 years  Exam Location[facility]  [facility] Procedure:      VAS [place] LOWER EXTREMITY VENOUS (DVT) Referring Phys: Freda Jackson --------------------------------------------------------------------------------  Indications: Edema.  Comparison Study: 03/28/18 prior Performing Technologist: Archie Patten RVS  Examination Guidelines: A complete evaluation includes B-mode imaging, spectral Doppler, color Doppler, and power Doppler as needed of all accessible portions of each vessel. Bilateral testing is considered an integral part of a complete examination. Limited examinations for reoccurring indications may be performed as noted. The reflux portion of the exam is performed with the patient in reverse Trendelenburg.  +---------+---------------+---------+-----------+----------+-------------------+ RIGHT    CompressibilityPhasicitySpontaneityPropertiesThrombus Aging      +---------+---------------+---------+-----------+----------+-------------------+ CFV      Full           Yes      Yes                                      +---------+---------------+---------+-----------+----------+-------------------+ SFJ      Full                                                             +---------+---------------+---------+-----------+----------+-------------------+ FV Prox  None                                         Acute               +---------+---------------+---------+-----------+----------+-------------------+ FV Mid   None                                         Acute               +---------+---------------+---------+-----------+----------+-------------------+ FV DistalNone                                         Acute               +---------+---------------+---------+-----------+----------+-------------------+ PFV      Full                                                             +---------+---------------+---------+-----------+----------+-------------------+  POP      None            No       No                   Acute               +---------+---------------+---------+-----------+----------+-------------------+ PTV      Full                                                             +---------+---------------+---------+-----------+----------+-------------------+ PERO                                                  patent by color-                                                          not well visualized +---------+---------------+---------+-----------+----------+-------------------+   +---------+---------------+---------+-----------+----------+--------------+ LEFT     CompressibilityPhasicitySpontaneityPropertiesThrombus Aging +---------+---------------+---------+-----------+----------+--------------+ CFV      Full           Yes      Yes                                 +---------+---------------+---------+-----------+----------+--------------+ SFJ      Full                                                        +---------+---------------+---------+-----------+----------+--------------+ FV Prox  Full                                                        +---------+---------------+---------+-----------+----------+--------------+ FV Mid   Full                                                        +---------+---------------+---------+-----------+----------+--------------+ FV DistalFull                                                        +---------+---------------+---------+-----------+----------+--------------+ PFV      Full                                                        +---------+---------------+---------+-----------+----------+--------------+  POP      Full           Yes      Yes                                 +---------+---------------+---------+-----------+----------+--------------+ PTV      Full                                                         +---------+---------------+---------+-----------+----------+--------------+ PERO     Full                                                        +---------+---------------+---------+-----------+----------+--------------+     Summary: RIGHT: - Findings consistent with acute deep vein thrombosis involving the right femoral vein, and right popliteal vein. - No cystic structure found in the popliteal fossa.  LEFT: - There is no evidence of deep vein thrombosis in the lower extremity.  - A cystic structure is found in the popliteal fossa.  *See table(s) above for measurements and observations. Electronically signed by Orlie Pollen on 11/07/2021 at 4:08:39 PM.    Final     Procedures .Critical Care  Performed by: Elgie Congo, MD Authorized by: Elgie Congo, MD   Critical care provider statement:    Critical care time (minutes):  35   Critical care was time spent personally by me on the following activities:  Development of treatment plan with patient or surrogate, discussions with consultants, evaluation of patient's response to treatment, examination of patient, ordering and review of laboratory studies, ordering and review of radiographic studies, ordering and performing treatments and interventions, pulse oximetry, re-evaluation of patient's condition, review of old charts and obtaining history from patient or surrogate   Care discussed with: admitting provider     Remain on constant cardiac monitoring, sinus bradycardia  Medications Ordered in ED Medications  heparin ADULT infusion 100 units/mL (25000 units/26m) (1,650 Units/hr Intravenous New Bag/Given 11/07/21 2011)  heparin bolus via infusion 3,000 Units (3,000 Units Intravenous Bolus from Bag 11/07/21 2013)    ED Course/ Medical Decision Making/ A&P                           Medical Decision Making CTA resulted earlier today with evidence of submassive PE with right heart strain as listed below. His pulmonologist called  earlier today saying he was not unstable but because the evidence of submassive PE he should probably just be admitted to hospitalist with heparin drip.  Clinically, patient is nontoxic and hemodynamically stable.  He is bradycardic but he is also on atenolol.  He is not endorsing any active chest pain.  His EKG looks similar to previous with minimal ST changes in the inferior leads and no reciprocal changes.  He has stable blood pressure 137/81.  Stable oxygen levels 95 to 100% on room air with no increased work of breathing.  Will reach out to hospitalist for admission with heparin drip.  "IMPRESSION: Positive for acute PE with CT evidence of right heart strain (RV/LV Ratio =  1.35) consistent with at least submassive (intermediate risk) PE. The presence of right heart strain has been associated with an increased risk of morbidity and mortality. Please refer to the "Code PE Focused" order set in EPIC.   4.2 cm ascending thoracic aortic aneurysm. Recommend annual imaging followup by CT. This recommendation follows 2010 ACCF/AHA/AATS/ACR/ASA/SCA/SCAI/SIR/STS/SVM Guidelines for the Diagnosis and Management of Patients with Thoracic Aortic Disease. Circulation. 2010; 121: R116-F790. Aortic aneurysm NOS (ICD10-I71.9)"  Amount and/or Complexity of Data Reviewed Labs: ordered.  Risk Prescription drug management. Decision regarding hospitalization.    Final Clinical Impression(s) / ED Diagnoses Final diagnoses:  Other acute pulmonary embolism, unspecified whether acute cor pulmonale present [facility])    Rx / DC Orders ED Discharge Orders     None         Elgie Congo, MD 11/07/21 2043

## 2021-11-07 NOTE — Telephone Encounter (Signed)
Spoke with pt's spouse [name] relaying the results of the doppler study and also let her know that Rx for Eliquis was sent to the pharmacy for pt. Relayed to her instructions on how to take it and also stated to her that the instructions would be on the Rx. Also stated to her that we would let them know the CT results once we had them.   [name] asked if it was positive for PE if pt would be admitted to the hospital. Per JD, if pt has worsening SOB, BP dropping, any other worsening symptoms that he would need to go to the ED. [name] verbalized understanding. Nothing further needed.

## 2021-11-08 ENCOUNTER — Inpatient Hospital Stay (HOSPITAL_COMMUNITY): Payer: 59

## 2021-11-08 DIAGNOSIS — I2609 Other pulmonary embolism with acute cor pulmonale: Secondary | ICD-10-CM | POA: Diagnosis not present

## 2021-11-08 LAB — ECHOCARDIOGRAM COMPLETE
Area-P 1/2: 2.48 cm2
Calc EF: 62 %
Height: 72 in
S' Lateral: 2.9 cm
Single Plane A2C EF: 67.2 %
Single Plane A4C EF: 56.5 %
Weight: 3942.39 oz

## 2021-11-08 LAB — CBC
HCT: 37.5 % — ABNORMAL LOW (ref 39.0–52.0)
Hemoglobin: 11.9 g/dL — ABNORMAL LOW (ref 13.0–17.0)
MCH: 30.2 pg (ref 26.0–34.0)
MCHC: 31.7 g/dL (ref 30.0–36.0)
MCV: 95.2 fL (ref 80.0–100.0)
Platelets: 116 10*3/uL — ABNORMAL LOW (ref 150–400)
RBC: 3.94 MIL/uL — ABNORMAL LOW (ref 4.22–5.81)
RDW: 14.2 % (ref 11.5–15.5)
WBC: 4.8 10*3/uL (ref 4.0–10.5)
nRBC: 0 % (ref 0.0–0.2)

## 2021-11-08 LAB — BASIC METABOLIC PANEL
Anion gap: 6 (ref 5–15)
BUN: 18 mg/dL (ref 8–23)
CO2: 26 mmol/L (ref 22–32)
Calcium: 8.8 mg/dL — ABNORMAL LOW (ref 8.9–10.3)
Chloride: 106 mmol/L (ref 98–111)
Creatinine, Ser: 1.14 mg/dL (ref 0.61–1.24)
GFR, Estimated: >60 mL/min (ref >60)
Glucose, Bld: 101 mg/dL — ABNORMAL HIGH (ref 70–99)
Potassium: 4.6 mmol/L (ref 3.5–5.1)
Sodium: 138 mmol/L (ref 135–145)

## 2021-11-08 LAB — HEPARIN LEVEL (UNFRACTIONATED)
Heparin Unfractionated: 0.52 IU/mL (ref 0.30–0.70)
Heparin Unfractionated: 0.62 IU/mL (ref 0.30–0.70)

## 2021-11-08 MED ORDER — MOMETASONE FURO-FORMOTEROL FUM 100-5 MCG/ACT IN AERO
2.0000 | INHALATION_SPRAY | Freq: Two times a day (BID) | RESPIRATORY_TRACT | Status: DC
  Administered 2021-11-10: 2 via RESPIRATORY_TRACT
  Filled 2021-11-08: qty 8.8

## 2021-11-08 MED ORDER — UMECLIDINIUM BROMIDE 62.5 MCG/ACT IN AEPB
1.0000 | INHALATION_SPRAY | Freq: Every day | RESPIRATORY_TRACT | Status: DC
  Administered 2021-11-10: 1 via RESPIRATORY_TRACT
  Filled 2021-11-08: qty 7

## 2021-11-08 NOTE — Hospital Course (Addendum)
71 y.o.m w/ history significant for CAD,hypertension, and DVT and PE in 2019 for which he completed 6 months of anticoagulation who presented with acute DVT and PE on outpatient imaging after  having 2 to 3 weeks of right leg swelling for which he had venous Dopplers positive for acute DVT involving the right femoral and right popliteal veins, and CTA showing acute pulmonary embolism with RV/LV ratio of 1.35.   He has chronic dyspnea that has worsened insidiously over the past 2 to 3 months but he denies any change in his chronic mild cough, denies hemoptysis, denies chest pain, and denies syncope or presyncope.  Denies any history of bleeding problems. In the ED hemodynamically stable.  Started on heparin drip and admitted.  Troponins unremarkable normal lactic acid CBC BMP stable with mild thrombocytopenia. Patient continued on heparin drip.  He remained hemodynamically stable, echo showed EF 65 to 70%, no RWMA right ventricular systolic function is low normal side is mildly enlarged  RV and RV systolic function is low normal.  Right lower leg swelling has improved, patient has been hemodynamically stable,repeat troponin negative He has been off oxygen ambulating in the hallway without issues.  Right leg swelling has resolved. transitioned to Eliquis p.o..  Medically stable for discharge home

## 2021-11-08 NOTE — Progress Notes (Signed)
  Echocardiogram 2D Echocardiogram has been performed.  Darlina Sicilian M 11/08/2021, 9:05 AM
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
## 2021-11-08 NOTE — Progress Notes (Signed)
PROGRESS NOTE Willie Dominguez  XQJ:[id number] DOB: October 15, 1950 DOA: 11/07/2021 PCP: [name], [name], MD   Brief Narrative/Hospital Course: 71 y.o.m w/ history significant for CAD,hypertension, and DVT and PE in 2019 for which he completed 6 months of anticoagulation who presented with acute DVT and PE on outpatient imaging after  having 2 to 3 weeks of right leg swelling for which he had venous Dopplers positive for acute DVT involving the right femoral and right popliteal veins, and CTA showing acute pulmonary embolism with RV/LV ratio of 1.35.   He has chronic dyspnea that has worsened insidiously over the past 2 to 3 months but he denies any change in his chronic mild cough, denies hemoptysis, denies chest pain, and denies syncope or presyncope.  Denies any history of bleeding problems. In the ED hemodynamically stable.  Started on heparin drip and admitted.  Troponins unremarkable normal lactic acid CBC BMP stable with mild thrombocytopenia      Subjective: Seen and examined.  Resting comfortably on room air no chest pain nausea vomiting fever chills.  Right leg is swollen. Assessment and Plan: Principal Problem:   Acute pulmonary embolism (HCC) Active Problems:   Essential (primary) hypertension   Coronary artery disease   Thoracic aortic aneurysm without rupture (HCC)   Sinus bradycardia  Acute PE with right heart strain on CT RV/LV 1.35 Acute DVT right femoral and right popliteal vein Previous history of DVT/PE in 2019-treated with 6 months of anticoagulation: Hemodynamically stable, no chest pain no hypoxia, troponin and BNP stable.  Continue heparin drip for now follow-up echocardiogram.  Likely transition to [place] tomorrow will need lifelong anticoagulation-reports genetic test has been sent by his PCP for his coagulation disorder  Sinus bradycardia asymptomatic.  Holding atenolol. CAD: Stable continue Crestor, beta-blocker on hold. Thoracic aortic aneurysm-noted in CT-he is  aware for op f/u. Class I obesity with BMI 33.4 Will benefit with weight loss  DVT prophylaxis:   Heparin Code Status:   Code Status: Full Code Family Communication: plan of care discussed with patient at bedside. Patient status is: Inpatient because of ongoing management of acute DVT PE Level of care: Telemetry   Dispo: The patient is from: Home            Anticipated disposition: Home in next 1 to 2 days  Mobility Assessment (last 72 hours)     Mobility Assessment     Row Name 11/07/21 2233           Does patient have an order for bedrest or is patient medically unstable No - Continue assessment       What is the highest level of mobility based on the progressive mobility assessment? Level 5 (Walks with assist in room/hall) - Balance while stepping forward/back and can walk in room with assist - Complete                 Objective: Vitals last 24 hrs: Vitals:   11/08/21 0112 11/08/21 0502 11/08/21 0615 11/08/21 1009  BP: 138/70  139/83 131/86  Pulse: (!) 51  (!) 50 (!) 49  Resp: '18  18 19  '$ Temp: (!) 97.4 F (36.3 C)   99.1 F (37.3 C)  TempSrc: Oral   Oral  SpO2: 96%  94% 97%  Weight:  111.8 kg    Height:  6' (1.829 m)     Weight change:   Physical Examination: General exam: alert awake,older than stated age, weak appearing. HEENT:Oral mucosa moist, Ear/Nose WNL grossly, dentition  normal. Respiratory system: bilaterally clear BS, no use of accessory muscle Cardiovascular system: S1 & S2 +, No JVD. Gastrointestinal system: Abdomen soft,NT,ND, BS+ Nervous System:Alert, awake, moving extremities and grossly nonfocal Extremities: LE edema + on RLE,distal peripheral pulses palpable.  Skin: No rashes,no icterus. MSK: Normal muscle bulk,tone, power  Medications reviewed: Scheduled Meds:  irbesartan  75 mg Oral Daily   mometasone-formoterol  2 puff Inhalation BID   montelukast  10 mg Oral QHS   rosuvastatin  20 mg Oral Daily   sodium chloride flush  3 mL  Intravenous Q12H   umeclidinium bromide  1 puff Inhalation Daily   Continuous Infusions:  heparin 1,650 Units/hr (11/08/21 0502)      Diet Order             Diet regular Room service appropriate? Yes; Fluid consistency: Thin  Diet effective now                            Intake/Output Summary (Last 24 hours) at 11/08/2021 1147 Last data filed at 11/08/2021 0502 Gross per 24 hour  Intake 156.03 ml  Output --  Net 156.03 ml   Net IO Since Admission: 156.03 mL [11/08/21 1147]  Wt Readings from Last 3 Encounters:  11/08/21 111.8 kg  11/07/21 111.8 kg  10/06/21 111.1 kg     Unresulted Labs (From admission, onward)     Start     Ordered   11/08/21 1600  Heparin level (unfractionated)  Once-Timed,   TIMED        11/08/21 0908   11/08/21 0500  CBC  Daily at 5am,   R      11/07/21 1949   11/08/21 0500  HIV Antibody (routine testing w rflx)  (HIV Antibody (Routine testing w reflex) panel)  Tomorrow morning,   R        11/07/21 2111   11/08/21 2426  Basic metabolic panel  Daily at 5am,   R      11/07/21 2111          Data Reviewed: I have personally reviewed following labs and imaging studies CBC: Recent Labs  Lab 11/07/21 1939 11/08/21 0747  WBC 5.9 4.8  HGB 13.0 11.9*  HCT 40.6 37.5*  MCV 95.3 95.2  PLT 133* 834*   Basic Metabolic Panel: Recent Labs  Lab 11/07/21 1939 11/08/21 0747  NA 136 138  K 4.2 4.6  CL 103 106  CO2 23 26  GLUCOSE 97 101*  BUN 18 18  CREATININE 1.09 1.14  CALCIUM 9.1 8.8*   Culture/Microbiology No results found for: "SDES", "SPECREQUEST", "CULT", "REPTSTATUS"  Other culture-see note  Radiology Studies: CT Angio Chest Pulmonary Embolism (PE) W or WO Contrast  Result Date: 11/07/2021 CLINICAL DATA:  Shortness of breath. Lower extremity DVT. High probability for pulmonary embolism. EXAM: CT ANGIOGRAPHY CHEST WITH CONTRAST TECHNIQUE: Multidetector CT imaging of the chest was performed using the standard protocol during bolus  administration of intravenous contrast. Multiplanar CT image reconstructions and MIPs were obtained to evaluate the vascular anatomy. RADIATION DOSE REDUCTION: This exam was performed according to the departmental dose-optimization program which includes automated exposure control, adjustment of the mA and/or kV according to patient size and/or use of iterative reconstruction technique. CONTRAST:  33m OMNIPAQUE IOHEXOL 350 MG/ML SOLN COMPARISON:  Noncontrast chest CT on 06/13/2021 FINDINGS: Cardiovascular: Acute pulmonary embolism is seen within the distal right pulmonary artery, as well as the right upper and lower  lobar pulmonary arteries. No left-sided pulmonary emboli are identified. Elevated RV LV ratio of 1.35 is seen, consistent with right heart strain. 4.2 cm ascending thoracic aortic aneurysm is noted. No evidence of thoracic aortic dissection. Mediastinum/Nodes: No masses or pathologically enlarged lymph nodes identified. Lungs/Pleura: No pulmonary mass, infiltrate, or effusion. Upper abdomen: No acute findings. Musculoskeletal: No suspicious bone lesions identified. Review of the MIP images confirms the above findings. IMPRESSION: Positive for acute PE with CT evidence of right heart strain (RV/LV Ratio = 1.35) consistent with at least submassive (intermediate risk) PE. The presence of right heart strain has been associated with an increased risk of morbidity and mortality. Please refer to the "Code PE Focused" order set in EPIC. 4.2 cm ascending thoracic aortic aneurysm. Recommend annual imaging followup by CT. This recommendation follows 2010 ACCF/AHA/AATS/ACR/ASA/SCA/SCAI/SIR/STS/SVM Guidelines for the Diagnosis and Management of Patients with Thoracic Aortic Disease. Circulation. 2010; 121: Q259-D638. Aortic aneurysm NOS (ICD10-I71.9) Critical Value/emergent results were called by telephone at the time of interpretation on 11/07/2021 at 6:34 pm to provider [facility] , who verbally acknowledged  these results. Electronically Signed   By: Marlaine Hind M.D.   On: 11/07/2021 18:45   VAS [place] LOWER EXTREMITY VENOUS (DVT)  Result Date: 11/07/2021  Lower Venous DVT Study Patient Name:  Willie Dominguez  Date of Exam:   11/07/2021 Medical Rec #: [id number]         Accession #:    [id number] Date of Birth: 1951/02/15        Patient Gender: M Patient Age:   73 years Exam Location:  [facility] Procedure:      VAS [place] LOWER EXTREMITY VENOUS (DVT) Referring Phys: Freda Jackson --------------------------------------------------------------------------------  Indications: Edema.  Comparison Study: 03/28/18 prior Performing Technologist: Archie Patten RVS  Examination Guidelines: A complete evaluation includes B-mode imaging, spectral Doppler, color Doppler, and power Doppler as needed of all accessible portions of each vessel. Bilateral testing is considered an integral part of a complete examination. Limited examinations for reoccurring indications may be performed as noted. The reflux portion of the exam is performed with the patient in reverse Trendelenburg.  +---------+---------------+---------+-----------+----------+-------------------+ RIGHT    CompressibilityPhasicitySpontaneityPropertiesThrombus Aging      +---------+---------------+---------+-----------+----------+-------------------+ CFV      Full           Yes      Yes                                      +---------+---------------+---------+-----------+----------+-------------------+ SFJ      Full                                                             +---------+---------------+---------+-----------+----------+-------------------+ FV Prox  None                                         Acute               +---------+---------------+---------+-----------+----------+-------------------+ FV Mid   None  Acute                +---------+---------------+---------+-----------+----------+-------------------+ FV DistalNone                                         Acute               +---------+---------------+---------+-----------+----------+-------------------+ PFV      Full                                                             +---------+---------------+---------+-----------+----------+-------------------+ POP      None           No       No                   Acute               +---------+---------------+---------+-----------+----------+-------------------+ PTV      Full                                                             +---------+---------------+---------+-----------+----------+-------------------+ PERO                                                  patent by color-                                                          not well visualized +---------+---------------+---------+-----------+----------+-------------------+   +---------+---------------+---------+-----------+----------+--------------+ LEFT     CompressibilityPhasicitySpontaneityPropertiesThrombus Aging +---------+---------------+---------+-----------+----------+--------------+ CFV      Full           Yes      Yes                                 +---------+---------------+---------+-----------+----------+--------------+ SFJ      Full                                                        +---------+---------------+---------+-----------+----------+--------------+ FV Prox  Full                                                        +---------+---------------+---------+-----------+----------+--------------+ FV Mid   Full                                                        +---------+---------------+---------+-----------+----------+--------------+  FV DistalFull                                                         +---------+---------------+---------+-----------+----------+--------------+ PFV      Full                                                        +---------+---------------+---------+-----------+----------+--------------+ POP      Full           Yes      Yes                                 +---------+---------------+---------+-----------+----------+--------------+ PTV      Full                                                        +---------+---------------+---------+-----------+----------+--------------+ PERO     Full                                                        +---------+---------------+---------+-----------+----------+--------------+     Summary: RIGHT: - Findings consistent with acute deep vein thrombosis involving the right femoral vein, and right popliteal vein. - No cystic structure found in the popliteal fossa.  LEFT: - There is no evidence of deep vein thrombosis in the lower extremity.  - A cystic structure is found in the popliteal fossa.  *See table(s) above for measurements and observations. Electronically signed by Orlie Pollen on 11/07/2021 at 4:08:39 PM.    Final      LOS: 1 day   Antonieta Pert, MD [facility]  11/08/2021, 11:47 AM

## 2021-11-08 NOTE — Progress Notes (Signed)
Pharmacy: Re- heparin  Patient is a 71 y.o M who is currently on heparin drip for VTE treatment.  - confirmatory heparin level collected at 4:23p is therapeutic at 0.62  Goal of Therapy:  Heparin level 0.3-0.7 units/ml Monitor platelets by anticoagulation protocol: Yes  Plan:  - continue heparin drip at 1650 units/hr - daily heparin level - monitor for s/sx bleeding  [name], PharmD, BCPS 11/08/2021 5:05 PM

## 2021-11-08 NOTE — Discharge Summary (Deleted)
PROGRESS NOTE REBEKAH SPRINKLE  TGY:[id number] DOB: 1950-12-31 DOA: 11/07/2021 PCP: [name], [name], MD   Brief Narrative/Hospital Course: 71 y.o.m w/ history significant for CAD,hypertension, and DVT and PE in 2019 for which he completed 6 months of anticoagulation who presented with acute DVT and PE on outpatient imaging after  having 2 to 3 weeks of right leg swelling for which he had venous Dopplers positive for acute DVT involving the right femoral and right popliteal veins, and CTA showing acute pulmonary embolism with RV/LV ratio of 1.35.   He has chronic dyspnea that has worsened insidiously over the past 2 to 3 months but he denies any change in his chronic mild cough, denies hemoptysis, denies chest pain, and denies syncope or presyncope.  Denies any history of bleeding problems. In the ED hemodynamically stable.  Started on heparin drip and admitted.  Troponins unremarkable normal lactic acid CBC BMP stable with mild thrombocytopenia      Subjective: Seen and examined.  Resting comfortably on room air no chest pain nausea vomiting fever chills.  Right leg is swollen. Assessment and Plan: Principal Problem:   Acute pulmonary embolism (HCC) Active Problems:   Essential (primary) hypertension   Coronary artery disease   Thoracic aortic aneurysm without rupture (HCC)   Sinus bradycardia  Acute PE with right heart strain on CT RV/LV 1.35 Acute DVT right femoral and right popliteal vein Previous history of DVT/PE in 2019-treated with 6 months of anticoagulation: Hemodynamically stable, no chest pain no hypoxia, troponin and BNP stable.  Continue heparin drip for now follow-up echocardiogram.  Likely transition to [place] tomorrow will need lifelong anticoagulation-reports genetic test has been sent by his PCP for his coagulation disorder  Sinus bradycardia asymptomatic.  Holding atenolol. CAD: Stable continue Crestor, beta-blocker on hold. Thoracic aortic aneurysm-noted in CT-he is  aware for op f/u. Class I obesity with BMI 33.4 Will benefit with weight loss  DVT prophylaxis:   Heparin Code Status:   Code Status: Full Code Family Communication: plan of care discussed with patient at bedside. Patient status is: Inpatient because of ongoing management of acute DVT PE Level of care: Telemetry   Dispo: The patient is from: Home            Anticipated disposition: Home in next 1 to 2 days  Mobility Assessment (last 72 hours)     Mobility Assessment     Row Name 11/07/21 2233           Does patient have an order for bedrest or is patient medically unstable No - Continue assessment       What is the highest level of mobility based on the progressive mobility assessment? Level 5 (Walks with assist in room/hall) - Balance while stepping forward/back and can walk in room with assist - Complete                 Objective: Vitals last 24 hrs: Vitals:   11/08/21 0112 11/08/21 0502 11/08/21 0615 11/08/21 1009  BP: 138/70  139/83 131/86  Pulse: (!) 51  (!) 50 (!) 49  Resp: '18  18 19  '$ Temp: (!) 97.4 F (36.3 C)   99.1 F (37.3 C)  TempSrc: Oral   Oral  SpO2: 96%  94% 97%  Weight:  111.8 kg    Height:  6' (1.829 m)     Weight change:   Physical Examination: General exam: alert awake,older than stated age, weak appearing. HEENT:Oral mucosa moist, Ear/Nose WNL grossly, dentition  normal. Respiratory system: bilaterally clear BS, no use of accessory muscle Cardiovascular system: S1 & S2 +, No JVD. Gastrointestinal system: Abdomen soft,NT,ND, BS+ Nervous System:Alert, awake, moving extremities and grossly nonfocal Extremities: LE edema + on RLE,distal peripheral pulses palpable.  Skin: No rashes,no icterus. MSK: Normal muscle bulk,tone, power  Medications reviewed: Scheduled Meds:  irbesartan  75 mg Oral Daily   mometasone-formoterol  2 puff Inhalation BID   montelukast  10 mg Oral QHS   rosuvastatin  20 mg Oral Daily   sodium chloride flush  3 mL  Intravenous Q12H   umeclidinium bromide  1 puff Inhalation Daily   Continuous Infusions:  heparin 1,650 Units/hr (11/08/21 0502)      Diet Order             Diet regular Room service appropriate? Yes; Fluid consistency: Thin  Diet effective now                            Intake/Output Summary (Last 24 hours) at 11/08/2021 1105 Last data filed at 11/08/2021 0502 Gross per 24 hour  Intake 156.03 ml  Output --  Net 156.03 ml   Net IO Since Admission: 156.03 mL [11/08/21 1105]  Wt Readings from Last 3 Encounters:  11/08/21 111.8 kg  11/07/21 111.8 kg  10/06/21 111.1 kg     Unresulted Labs (From admission, onward)     Start     Ordered   11/08/21 1600  Heparin level (unfractionated)  Once-Timed,   TIMED        11/08/21 0908   11/08/21 0500  CBC  Daily at 5am,   R      11/07/21 1949   11/08/21 0500  HIV Antibody (routine testing w rflx)  (HIV Antibody (Routine testing w reflex) panel)  Tomorrow morning,   R        11/07/21 2111   11/08/21 3244  Basic metabolic panel  Daily at 5am,   R      11/07/21 2111          Data Reviewed: I have personally reviewed following labs and imaging studies CBC: Recent Labs  Lab 11/07/21 1939 11/08/21 0747  WBC 5.9 4.8  HGB 13.0 11.9*  HCT 40.6 37.5*  MCV 95.3 95.2  PLT 133* 010*   Basic Metabolic Panel: Recent Labs  Lab 11/07/21 1939 11/08/21 0747  NA 136 138  K 4.2 4.6  CL 103 106  CO2 23 26  GLUCOSE 97 101*  BUN 18 18  CREATININE 1.09 1.14  CALCIUM 9.1 8.8*   Culture/Microbiology No results found for: "SDES", "SPECREQUEST", "CULT", "REPTSTATUS"  Other culture-see note  Radiology Studies: CT Angio Chest Pulmonary Embolism (PE) W or WO Contrast  Result Date: 11/07/2021 CLINICAL DATA:  Shortness of breath. Lower extremity DVT. High probability for pulmonary embolism. EXAM: CT ANGIOGRAPHY CHEST WITH CONTRAST TECHNIQUE: Multidetector CT imaging of the chest was performed using the standard protocol during bolus  administration of intravenous contrast. Multiplanar CT image reconstructions and MIPs were obtained to evaluate the vascular anatomy. RADIATION DOSE REDUCTION: This exam was performed according to the departmental dose-optimization program which includes automated exposure control, adjustment of the mA and/or kV according to patient size and/or use of iterative reconstruction technique. CONTRAST:  56m OMNIPAQUE IOHEXOL 350 MG/ML SOLN COMPARISON:  Noncontrast chest CT on 06/13/2021 FINDINGS: Cardiovascular: Acute pulmonary embolism is seen within the distal right pulmonary artery, as well as the right upper and lower  lobar pulmonary arteries. No left-sided pulmonary emboli are identified. Elevated RV LV ratio of 1.35 is seen, consistent with right heart strain. 4.2 cm ascending thoracic aortic aneurysm is noted. No evidence of thoracic aortic dissection. Mediastinum/Nodes: No masses or pathologically enlarged lymph nodes identified. Lungs/Pleura: No pulmonary mass, infiltrate, or effusion. Upper abdomen: No acute findings. Musculoskeletal: No suspicious bone lesions identified. Review of the MIP images confirms the above findings. IMPRESSION: Positive for acute PE with CT evidence of right heart strain (RV/LV Ratio = 1.35) consistent with at least submassive (intermediate risk) PE. The presence of right heart strain has been associated with an increased risk of morbidity and mortality. Please refer to the "Code PE Focused" order set in EPIC. 4.2 cm ascending thoracic aortic aneurysm. Recommend annual imaging followup by CT. This recommendation follows 2010 ACCF/AHA/AATS/ACR/ASA/SCA/SCAI/SIR/STS/SVM Guidelines for the Diagnosis and Management of Patients with Thoracic Aortic Disease. Circulation. 2010; 121: Z025-E527. Aortic aneurysm NOS (ICD10-I71.9) Critical Value/emergent results were called by telephone at the time of interpretation on 11/07/2021 at 6:34 pm to provider [facility] , who verbally acknowledged  these results. Electronically Signed   By: Marlaine Hind M.D.   On: 11/07/2021 18:45   VAS [place] LOWER EXTREMITY VENOUS (DVT)  Result Date: 11/07/2021  Lower Venous DVT Study Patient Name:  NUSSEN PULLIN Rief  Date of Exam:   11/07/2021 Medical Rec #: [id number]         Accession #:    [id number] Date of Birth: November 25, 1950        Patient Gender: M Patient Age:   49 years Exam Location:  [facility] Procedure:      VAS [place] LOWER EXTREMITY VENOUS (DVT) Referring Phys: Freda Jackson --------------------------------------------------------------------------------  Indications: Edema.  Comparison Study: 03/28/18 prior Performing Technologist: Archie Patten RVS  Examination Guidelines: A complete evaluation includes B-mode imaging, spectral Doppler, color Doppler, and power Doppler as needed of all accessible portions of each vessel. Bilateral testing is considered an integral part of a complete examination. Limited examinations for reoccurring indications may be performed as noted. The reflux portion of the exam is performed with the patient in reverse Trendelenburg.  +---------+---------------+---------+-----------+----------+-------------------+ RIGHT    CompressibilityPhasicitySpontaneityPropertiesThrombus Aging      +---------+---------------+---------+-----------+----------+-------------------+ CFV      Full           Yes      Yes                                      +---------+---------------+---------+-----------+----------+-------------------+ SFJ      Full                                                             +---------+---------------+---------+-----------+----------+-------------------+ FV Prox  None                                         Acute               +---------+---------------+---------+-----------+----------+-------------------+ FV Mid   None  Acute                +---------+---------------+---------+-----------+----------+-------------------+ FV DistalNone                                         Acute               +---------+---------------+---------+-----------+----------+-------------------+ PFV      Full                                                             +---------+---------------+---------+-----------+----------+-------------------+ POP      None           No       No                   Acute               +---------+---------------+---------+-----------+----------+-------------------+ PTV      Full                                                             +---------+---------------+---------+-----------+----------+-------------------+ PERO                                                  patent by color-                                                          not well visualized +---------+---------------+---------+-----------+----------+-------------------+   +---------+---------------+---------+-----------+----------+--------------+ LEFT     CompressibilityPhasicitySpontaneityPropertiesThrombus Aging +---------+---------------+---------+-----------+----------+--------------+ CFV      Full           Yes      Yes                                 +---------+---------------+---------+-----------+----------+--------------+ SFJ      Full                                                        +---------+---------------+---------+-----------+----------+--------------+ FV Prox  Full                                                        +---------+---------------+---------+-----------+----------+--------------+ FV Mid   Full                                                        +---------+---------------+---------+-----------+----------+--------------+  FV DistalFull                                                         +---------+---------------+---------+-----------+----------+--------------+ PFV      Full                                                        +---------+---------------+---------+-----------+----------+--------------+ POP      Full           Yes      Yes                                 +---------+---------------+---------+-----------+----------+--------------+ PTV      Full                                                        +---------+---------------+---------+-----------+----------+--------------+ PERO     Full                                                        +---------+---------------+---------+-----------+----------+--------------+     Summary: RIGHT: - Findings consistent with acute deep vein thrombosis involving the right femoral vein, and right popliteal vein. - No cystic structure found in the popliteal fossa.  LEFT: - There is no evidence of deep vein thrombosis in the lower extremity.  - A cystic structure is found in the popliteal fossa.  *See table(s) above for measurements and observations. Electronically signed by Orlie Pollen on 11/07/2021 at 4:08:39 PM.    Final      LOS: 1 day   Antonieta Pert, MD [facility]  11/08/2021, 11:05 AM

## 2021-11-09 DIAGNOSIS — I2609 Other pulmonary embolism with acute cor pulmonale: Secondary | ICD-10-CM | POA: Diagnosis not present

## 2021-11-09 LAB — BASIC METABOLIC PANEL
Anion gap: 6 (ref 5–15)
BUN: 15 mg/dL (ref 8–23)
CO2: 26 mmol/L (ref 22–32)
Calcium: 8.7 mg/dL — ABNORMAL LOW (ref 8.9–10.3)
Chloride: 106 mmol/L (ref 98–111)
Creatinine, Ser: 1.14 mg/dL (ref 0.61–1.24)
GFR, Estimated: >60 mL/min (ref >60)
Glucose, Bld: 110 mg/dL — ABNORMAL HIGH (ref 70–99)
Potassium: 4.3 mmol/L (ref 3.5–5.1)
Sodium: 138 mmol/L (ref 135–145)

## 2021-11-09 LAB — CBC
HCT: 36.4 % — ABNORMAL LOW (ref 39.0–52.0)
Hemoglobin: 11.5 g/dL — ABNORMAL LOW (ref 13.0–17.0)
MCH: 29.9 pg (ref 26.0–34.0)
MCHC: 31.6 g/dL (ref 30.0–36.0)
MCV: 94.8 fL (ref 80.0–100.0)
Platelets: 119 10*3/uL — ABNORMAL LOW (ref 150–400)
RBC: 3.84 MIL/uL — ABNORMAL LOW (ref 4.22–5.81)
RDW: 14.3 % (ref 11.5–15.5)
WBC: 4.2 10*3/uL (ref 4.0–10.5)
nRBC: 0 % (ref 0.0–0.2)

## 2021-11-09 LAB — HEPARIN LEVEL (UNFRACTIONATED)
Heparin Unfractionated: 0.69 IU/mL (ref 0.30–0.70)
Heparin Unfractionated: 0.53 IU/mL (ref 0.30–0.70)

## 2021-11-09 LAB — HIV ANTIBODY (ROUTINE TESTING W REFLEX): HIV Screen 4th Generation wRfx: NONREACTIVE

## 2021-11-09 LAB — TROPONIN I (HIGH SENSITIVITY): Troponin I (High Sensitivity): 4 ng/L (ref <18)
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
## 2021-11-09 NOTE — Progress Notes (Signed)
Pharmacy: [facility]  Patient is a 71 y.o M who is currently on heparin drip for acute DVT/PE.  - Heparin level collected at  6:24p is therapeutic at 0.53  Goal of Therapy:  Heparin level 0.3-0.7 units/ml Monitor platelets by anticoagulation protocol: Yes  Plan: - continue heparin drip at 1600 units/hr - daily heparin level - monitor for s/sx bleeding   [name], PharmD, BCPS 11/09/2021 7:50 PM

## 2021-11-09 NOTE — Progress Notes (Signed)
PROGRESS NOTE Willie Dominguez  PIR:[id number] DOB: [birth year] DOA: 11/07/2021 PCP: [name], [name], MD   Brief Narrative/Hospital Course: 71 y.o.m w/ history significant for CAD,hypertension, and DVT and PE in 2019 for which he completed 6 months of anticoagulation who presented with acute DVT and PE on outpatient imaging after  having 2 to 3 weeks of right leg swelling for which he had venous Dopplers positive for acute DVT involving the right femoral and right popliteal veins, and CTA showing acute pulmonary embolism with RV/LV ratio of 1.35.   He has chronic dyspnea that has worsened insidiously over the past 2 to 3 months but he denies any change in his chronic mild cough, denies hemoptysis, denies chest pain, and denies syncope or presyncope.  Denies any history of bleeding problems. In the ED hemodynamically stable.  Started on heparin drip and admitted.  Troponins unremarkable normal lactic acid CBC BMP stable with mild thrombocytopenia. Patient continued on heparin drip.  He remained hemodynamically stable, echo showed EF 65 to 70%, no RWMA right ventricular systolic function is low normal side is mildly enlarged  RV and RV systolic function is low normal     Subjective: Seen and examined this morning right lower extremity swelling appears much better  He states RLE is less tense  Remains on room air ambulate in the room air-ambulating in the room without any issue  Assessment and Plan: Principal Problem:   Acute pulmonary embolism (HCC) Active Problems:   Essential (primary) hypertension   Coronary artery disease   Thoracic aortic aneurysm without rupture (HCC)   Sinus bradycardia  Acute PE with right heart strain on CT RV/LV 1.35 Acute DVT right femoral and right popliteal vein Previous history of DVT/PE in 2019-treated with 6 months of anticoagulation: Hemodynamically stable, no chest pain no hypoxia, troponin negative again this morning BNP stable, echo shows echo showed EF 65  to 70%, no RWMA right ventricular systolic function is low normal side is mildly enlarged  RV and RV systolic function is low normal.  Continue heparin drip at left next 24 hours and  transition to DOAC tomorrow- he will need lifelong anticoagulation-reports genetic test has been sent by his PCP for his coagulation disorder  HRN/CAD/Sinus bradycardia asymptomatic:stable,holding atenolol, cont ARBs,Crestor. Thoracic aortic aneurysm-noted in CT-he is aware for op f/u. Class I obesity with BMI 33.4 Will benefit with weight loss  DVT prophylaxis:   Heparin Code Status:   Code Status: Full Code Family Communication: plan of care discussed with patient at bedside. Patient status is: Inpatient because of ongoing management of acute DVT PE Level of care: Telemetry   Dispo: The patient is from: Home            Anticipated disposition: Home in next 1 day  Mobility Assessment (last 72 hours)     Mobility Assessment     Row Name 11/07/21 2233           Does patient have an order for bedrest or is patient medically unstable No - Continue assessment       What is the highest level of mobility based on the progressive mobility assessment? Level 5 (Walks with assist in room/hall) - Balance while stepping forward/back and can walk in room with assist - Complete                 Objective: Vitals last 24 hrs: Vitals:   11/08/21 1009 11/08/21 1337 11/08/21 2013 11/09/21 0505  BP: 131/86 134/74 132/77 138/84  Pulse: (!) 49 (!) 49 (!) 51 (!) 50  Resp: '19 18 18 18  '$ Temp: 99.1 F (37.3 C) 97.7 F (36.5 C) 98.3 F (36.8 C) (!) 97.3 F (36.3 C)  TempSrc: Oral Oral Oral Oral  SpO2: 97% 94% 95% 93%  Weight:      Height:       Weight change:   Physical Examination: General exam: AAox3,older than stated age, weak appearing. HEENT:Oral mucosa moist, Ear/Nose WNL grossly, dentition normal. Respiratory system: bilaterally clear,no use of accessory muscle Cardiovascular system: S1 & S2 +, No  JVD,. Gastrointestinal system: Abdomen soft,NT,ND, BS+ Nervous System:Alert, awake, moving extremities and grossly nonfocal Extremities: edema neg, right LE minimally swollen, distal peripheral pulses palpable.  Skin: No rashes,no icterus. MSK: Normal muscle bulk,tone, power   Medications reviewed: Scheduled Meds:  irbesartan  75 mg Oral Daily   mometasone-formoterol  2 puff Inhalation BID   montelukast  10 mg Oral QHS   rosuvastatin  20 mg Oral Daily   sodium chloride flush  3 mL Intravenous Q12H   umeclidinium bromide  1 puff Inhalation Daily   Continuous Infusions:  heparin 1,650 Units/hr (11/09/21 0539)      Diet Order             Diet regular Room service appropriate? Yes; Fluid consistency: Thin  Diet effective now                            Intake/Output Summary (Last 24 hours) at 11/09/2021 1027 Last data filed at 11/09/2021 1015 Gross per 24 hour  Intake 1562.12 ml  Output 0 ml  Net 1562.12 ml   Net IO Since Admission: 1,718.15 mL [11/09/21 1027]  Wt Readings from Last 3 Encounters:  11/08/21 111.8 kg  11/07/21 111.8 kg  10/06/21 111.1 kg     Unresulted Labs (From admission, onward)     Start     Ordered   11/09/21 0500  Heparin level (unfractionated)  Daily at 5am,   R      11/08/21 1706   11/08/21 0500  CBC  Daily at 5am,   R      11/07/21 1949   11/08/21 0500  HIV Antibody (routine testing w rflx)  (HIV Antibody (Routine testing w reflex) panel)  Tomorrow morning,   R        11/07/21 2111   11/08/21 9449  Basic metabolic panel  Daily at 5am,   R      11/07/21 2111          Data Reviewed: I have personally reviewed following labs and imaging studies CBC: Recent Labs  Lab 11/07/21 1939 11/08/21 0747 11/09/21 0559  WBC 5.9 4.8 4.2  HGB 13.0 11.9* 11.5*  HCT 40.6 37.5* 36.4*  MCV 95.3 95.2 94.8  PLT 133* 116* 675*   Basic Metabolic Panel: Recent Labs  Lab 11/07/21 1939 11/08/21 0747 11/09/21 0559  NA 136 138 138  K 4.2 4.6  4.3  CL 103 106 106  CO2 '23 26 26  '$ GLUCOSE 97 101* 110*  BUN '18 18 15  '$ CREATININE 1.09 1.14 1.14  CALCIUM 9.1 8.8* 8.7*   Culture/Microbiology No results found for: "SDES", "SPECREQUEST", "CULT", "REPTSTATUS"  Other culture-see note  Radiology Studies: ECHOCARDIOGRAM COMPLETE  Result Date: 11/08/2021    ECHOCARDIOGRAM REPORT   Patient Name:   DAWSEN KRIEGER Huckaba Date of Exam: 11/08/2021 Medical Rec #:  [id number]  Height:       72.0 in Accession #:    [id number]       Weight:       246.4 lb Date of Birth:  1951/01/23       BSA:          2.328 m Patient Age:    68 years         BP:           139/83 mmHg Patient Gender: M                HR:           50 bpm. Exam Location:  Inpatient Procedure: 2D Echo, Color Doppler and Cardiac Doppler Indications:     Pulmonary Embolus I26.09  History:         Patient has prior history of Echocardiogram examinations, most                  recent 08/24/2021. Risk Factors:Hypertension and Diabetes. Past                  history of DVT PE in 2019. Chronic kidney disease.  Sonographer:     Darlina Sicilian RDCS Referring Phys:  [id number] TIMOTHY S OPYD Diagnosing Phys: Vernell Leep MD IMPRESSIONS  1. Left ventricular ejection fraction, by estimation, is 65 to 70%. The left ventricle has normal function. The left ventricle has no regional wall motion abnormalities. There is mild left ventricular hypertrophy. Left ventricular diastolic parameters are indeterminate.  2. Right ventricular systolic function is low normal. The right ventricular size is mildly enlarged.  3. Left atrial size was mildly dilated.  4. The mitral valve is normal in structure. Mild mitral valve regurgitation. No evidence of mitral stenosis.  5. The aortic valve is normal in structure. Aortic valve regurgitation is mild. No aortic stenosis is present.  6. Aortic dilatation noted. There is dilatation of the aortic root, measuring 39 mm. There is dilatation of the ascending aorta, measuring 38 mm.  Comparison(s): Compared to outpatient stydy in 07/2020, RV dilatation is new. FINDINGS  Left Ventricle: Left ventricular ejection fraction, by estimation, is 65 to 70%. The left ventricle has normal function. The left ventricle has no regional wall motion abnormalities. The left ventricular internal cavity size was normal in size. There is  mild left ventricular hypertrophy. Left ventricular diastolic parameters are indeterminate. Right Ventricle: The right ventricular size is mildly enlarged. No increase in right ventricular wall thickness. Right ventricular systolic function is low normal. Left Atrium: Left atrial size was mildly dilated. Right Atrium: Right atrial size was normal in size. Pericardium: There is no evidence of pericardial effusion. Mitral Valve: The mitral valve is normal in structure. Mild mitral valve regurgitation. No evidence of mitral valve stenosis. Tricuspid Valve: The tricuspid valve is normal in structure. Tricuspid valve regurgitation is mild. Aortic Valve: The aortic valve is normal in structure. Aortic valve regurgitation is mild. No aortic stenosis is present. Pulmonic Valve: The pulmonic valve was normal in structure. Pulmonic valve regurgitation is not visualized. No evidence of pulmonic stenosis. Aorta: Aortic dilatation noted. There is dilatation of the aortic root, measuring 39 mm. There is dilatation of the ascending aorta, measuring 38 mm. IAS/Shunts: The interatrial septum was not assessed.  LEFT VENTRICLE PLAX 2D LVIDd:         4.80 cm      Diastology LVIDs:         2.90 cm  LV e' medial:    6.85 cm/s LV PW:         1.20 cm      LV E/e' medial:  10.8 LV IVS:        1.30 cm      LV e' lateral:   12.10 cm/s LVOT diam:     2.40 cm      LV E/e' lateral: 6.1 LV SV:         96 LV SV Index:   41 LVOT Area:     4.52 cm  LV Volumes (MOD) LV vol d, MOD A2C: 144.0 ml LV vol d, MOD A4C: 148.0 ml LV vol s, MOD A2C: 47.3 ml LV vol s, MOD A4C: 64.4 ml LV SV MOD A2C:     96.7 ml LV SV MOD  A4C:     148.0 ml LV SV MOD BP:      90.5 ml RIGHT VENTRICLE RV Basal diam:  6.00 cm RV Mid diam:    4.35 cm RV S prime:     9.03 cm/s TAPSE (M-mode): 1.9 cm LEFT ATRIUM             Index        RIGHT ATRIUM           Index LA diam:        4.40 cm 1.89 cm/m   RA Area:     17.50 cm LA Vol (A2C):   99.7 ml 42.82 ml/m  RA Volume:   43.10 ml  18.51 ml/m LA Vol (A4C):   80.9 ml 34.75 ml/m LA Biplane Vol: 92.4 ml 39.69 ml/m  AORTIC VALVE LVOT Vmax:   79.00 cm/s LVOT Vmean:  56.200 cm/s LVOT VTI:    0.212 m  AORTA Ao Root diam: 3.90 cm MITRAL VALVE MV Area (PHT): 2.48 cm    SHUNTS MV Decel Time: 306 msec    Systemic VTI:  0.21 m MV E velocity: 73.70 cm/s  Systemic Diam: 2.40 cm MV A velocity: 42.00 cm/s MV E/A ratio:  1.75 Vernell Leep MD Electronically signed by Vernell Leep MD Signature Date/Time: 11/08/2021/11:49:07 AM    Final    CT Angio Chest Pulmonary Embolism (PE) W or WO Contrast  Result Date: 11/07/2021 CLINICAL DATA:  Shortness of breath. Lower extremity DVT. High probability for pulmonary embolism. EXAM: CT ANGIOGRAPHY CHEST WITH CONTRAST TECHNIQUE: Multidetector CT imaging of the chest was performed using the standard protocol during bolus administration of intravenous contrast. Multiplanar CT image reconstructions and MIPs were obtained to evaluate the vascular anatomy. RADIATION DOSE REDUCTION: This exam was performed according to the departmental dose-optimization program which includes automated exposure control, adjustment of the mA and/or kV according to patient size and/or use of iterative reconstruction technique. CONTRAST:  37m OMNIPAQUE IOHEXOL 350 MG/ML SOLN COMPARISON:  Noncontrast chest CT on 06/13/2021 FINDINGS: Cardiovascular: Acute pulmonary embolism is seen within the distal right pulmonary artery, as well as the right upper and lower lobar pulmonary arteries. No left-sided pulmonary emboli are identified. Elevated RV LV ratio of 1.35 is seen, consistent with right heart  strain. 4.2 cm ascending thoracic aortic aneurysm is noted. No evidence of thoracic aortic dissection. Mediastinum/Nodes: No masses or pathologically enlarged lymph nodes identified. Lungs/Pleura: No pulmonary mass, infiltrate, or effusion. Upper abdomen: No acute findings. Musculoskeletal: No suspicious bone lesions identified. Review of the MIP images confirms the above findings. IMPRESSION: Positive for acute PE with CT evidence of right heart strain (RV/LV Ratio = 1.35) consistent with  at least submassive (intermediate risk) PE. The presence of right heart strain has been associated with an increased risk of morbidity and mortality. Please refer to the "Code PE Focused" order set in EPIC. 4.2 cm ascending thoracic aortic aneurysm. Recommend annual imaging followup by CT. This recommendation follows 2010 ACCF/AHA/AATS/ACR/ASA/SCA/SCAI/SIR/STS/SVM Guidelines for the Diagnosis and Management of Patients with Thoracic Aortic Disease. Circulation. 2010; 121: X833-A250. Aortic aneurysm NOS (ICD10-I71.9) Critical Value/emergent results were called by telephone at the time of interpretation on 11/07/2021 at 6:34 pm to provider [facility] , who verbally acknowledged these results. Electronically Signed   By: Marlaine Hind M.D.   On: 11/07/2021 18:45   VAS [place] LOWER EXTREMITY VENOUS (DVT)  Result Date: 11/07/2021  Lower Venous DVT Study Patient Name:  RONELLE SMALLMAN Kramme  Date of Exam:   11/07/2021 Medical Rec #: [id number]         Accession #:    [id number] Date of Birth: 03/23/1950        Patient Gender: M Patient Age:   81 years Exam Location:  [facility] Procedure:      VAS [place] LOWER EXTREMITY VENOUS (DVT) Referring Phys: Freda Jackson --------------------------------------------------------------------------------  Indications: Edema.  Comparison Study: 03/28/18 prior Performing Technologist: Archie Patten RVS  Examination Guidelines: A complete evaluation includes B-mode imaging, spectral Doppler, color  Doppler, and power Doppler as needed of all accessible portions of each vessel. Bilateral testing is considered an integral part of a complete examination. Limited examinations for reoccurring indications may be performed as noted. The reflux portion of the exam is performed with the patient in reverse Trendelenburg.  +---------+---------------+---------+-----------+----------+-------------------+ RIGHT    CompressibilityPhasicitySpontaneityPropertiesThrombus Aging      +---------+---------------+---------+-----------+----------+-------------------+ CFV      Full           Yes      Yes                                      +---------+---------------+---------+-----------+----------+-------------------+ SFJ      Full                                                             +---------+---------------+---------+-----------+----------+-------------------+ FV Prox  None                                         Acute               +---------+---------------+---------+-----------+----------+-------------------+ FV Mid   None                                         Acute               +---------+---------------+---------+-----------+----------+-------------------+ FV DistalNone                                         Acute               +---------+---------------+---------+-----------+----------+-------------------+  PFV      Full                                                             +---------+---------------+---------+-----------+----------+-------------------+ POP      None           No       No                   Acute               +---------+---------------+---------+-----------+----------+-------------------+ PTV      Full                                                             +---------+---------------+---------+-----------+----------+-------------------+ PERO                                                  patent by color-                                                           not well visualized +---------+---------------+---------+-----------+----------+-------------------+   +---------+---------------+---------+-----------+----------+--------------+ LEFT     CompressibilityPhasicitySpontaneityPropertiesThrombus Aging +---------+---------------+---------+-----------+----------+--------------+ CFV      Full           Yes      Yes                                 +---------+---------------+---------+-----------+----------+--------------+ SFJ      Full                                                        +---------+---------------+---------+-----------+----------+--------------+ FV Prox  Full                                                        +---------+---------------+---------+-----------+----------+--------------+ FV Mid   Full                                                        +---------+---------------+---------+-----------+----------+--------------+ FV DistalFull                                                        +---------+---------------+---------+-----------+----------+--------------+  PFV      Full                                                        +---------+---------------+---------+-----------+----------+--------------+ POP      Full           Yes      Yes                                 +---------+---------------+---------+-----------+----------+--------------+ PTV      Full                                                        +---------+---------------+---------+-----------+----------+--------------+ PERO     Full                                                        +---------+---------------+---------+-----------+----------+--------------+     Summary: RIGHT: - Findings consistent with acute deep vein thrombosis involving the right femoral vein, and right popliteal vein. - No cystic structure found in the popliteal fossa.  LEFT: -  There is no evidence of deep vein thrombosis in the lower extremity.  - A cystic structure is found in the popliteal fossa.  *See table(s) above for measurements and observations. Electronically signed by Orlie Pollen on 11/07/2021 at 4:08:39 PM.    Final      LOS: 2 days   Antonieta Pert, MD [facility]  11/09/2021, 10:27 AM

## 2021-11-10 ENCOUNTER — Other Ambulatory Visit (HOSPITAL_COMMUNITY): Payer: Self-pay

## 2021-11-10 DIAGNOSIS — I2609 Other pulmonary embolism with acute cor pulmonale: Secondary | ICD-10-CM | POA: Diagnosis not present

## 2021-11-10 LAB — CBC
HCT: 37.8 % — ABNORMAL LOW (ref 39.0–52.0)
Hemoglobin: 11.9 g/dL — ABNORMAL LOW (ref 13.0–17.0)
MCH: 30 pg (ref 26.0–34.0)
MCHC: 31.5 g/dL (ref 30.0–36.0)
MCV: 95.2 fL (ref 80.0–100.0)
Platelets: 126 10*3/uL — ABNORMAL LOW (ref 150–400)
RBC: 3.97 MIL/uL — ABNORMAL LOW (ref 4.22–5.81)
RDW: 14.3 % (ref 11.5–15.5)
WBC: 3.7 10*3/uL — ABNORMAL LOW (ref 4.0–10.5)
nRBC: 0 % (ref 0.0–0.2)

## 2021-11-10 LAB — BASIC METABOLIC PANEL
Anion gap: 5 (ref 5–15)
BUN: 15 mg/dL (ref 8–23)
CO2: 25 mmol/L (ref 22–32)
Calcium: 9 mg/dL (ref 8.9–10.3)
Chloride: 110 mmol/L (ref 98–111)
Creatinine, Ser: 1.07 mg/dL (ref 0.61–1.24)
GFR, Estimated: >60 mL/min (ref >60)
Glucose, Bld: 113 mg/dL — ABNORMAL HIGH (ref 70–99)
Potassium: 4.2 mmol/L (ref 3.5–5.1)
Sodium: 140 mmol/L (ref 135–145)

## 2021-11-10 LAB — HEPARIN LEVEL (UNFRACTIONATED): Heparin Unfractionated: 0.51 IU/mL (ref 0.30–0.70)

## 2021-11-10 MED ORDER — ASPIRIN EC 81 MG PO TBEC: 81.0000 mg | DELAYED_RELEASE_TABLET | Freq: Every day | ORAL | 11 refills | Status: DC

## 2021-11-10 MED ORDER — APIXABAN (ELIQUIS) VTE STARTER PACK (10MG AND 5MG)
ORAL_TABLET | ORAL | 0 refills | Status: DC
  Filled 2021-11-10: qty 74, 30d supply, fill #0

## 2021-11-10 MED ORDER — APIXABAN 5 MG PO TABS
10.0000 mg | ORAL_TABLET | Freq: Two times a day (BID) | ORAL | Status: DC
  Administered 2021-11-10: 10 mg via ORAL
  Filled 2021-11-10: qty 2

## 2021-11-10 MED ORDER — APIXABAN 5 MG PO TABS: 5.0000 mg | ORAL_TABLET | Freq: Two times a day (BID) | ORAL | Status: DC

## 2021-11-10 NOTE — Progress Notes (Signed)
[place] for Heparin Indication: pulmonary embolus  Allergies  Allergen Reactions   Methotrexate Derivatives Other (See Comments)    Mouth ulcers   Cardura [Doxazosin] Other (See Comments)    Aggravated prostate Oliguria Bladder pain and irritation Urinary hesitancy   Lipitor [Atorvastatin] Rash   Penicillins Rash    DID THE REACTION INVOLVE: Swelling of the face/tongue/throat, SOB, or low BP? Y Sudden or severe rash/hives, skin peeling, or the inside of the mouth or nose? Y Did it require medical treatment? N When did it last happen?  1970 If all above answers are "NO", may proceed with cephalosporin use.    Zestril [Lisinopril] Other (See Comments)    Depression Fatigue   Bystolic [Nebivolol Hcl] Rash   Zocor [Simvastatin] Rash    Patient Measurements: Height: 6' (182.9 cm) Weight: 111.8 kg (246 lb 6.4 oz) IBW/kg (Calculated) : 77.6 Heparin Dosing Weight: 101 kg  Vital Signs: Temp: 97.9 F (36.6 C) (09/11 0503) Temp Source: Oral (09/11 0503) BP: 130/73 (09/11 0503) Pulse Rate: 50 (09/11 0504)  Labs: Recent Labs    11/07/21 1939 11/08/21 0747 11/08/21 1623 11/09/21 0559 11/09/21 1824 11/10/21 0533  HGB 13.0 11.9*  --  11.5*  --  11.9*  HCT 40.6 37.5*  --  36.4*  --  37.8*  PLT 133* 116*  --  119*  --  126*  APTT 26  --   --   --   --   --   LABPROT 13.9  --   --   --   --   --   INR 1.1  --   --   --   --   --   HEPARINUNFRC  --  0.52   < > 0.69 0.53 0.51  CREATININE 1.09 1.14  --  1.14  --  1.07  TROPONINIHS 4  --   --  4  --   --    < > = values in this interval not displayed.     Estimated Creatinine Clearance: 83 mL/min (by C-G formula based on SCr of 1.07 mg/dL).   Assessment: 71 yo male presented to pulmonary appointment with cough and SOB after COVID-19 infection. He reports a history of DVT on Eliquis in past. At the clinic, he was found to have an acute DVT and was to restart Eliquis as outpatient. He  then underwent CTA which revealed an acute PE with evidence of right heart strain and patient was advised to come to [facility] ED to start IV heparin. Pharmacy consulted to manage.  Today, 11/08/21 AM heparin level remains therapeutic and stable on 1600 units/hr Hgb & Plt both slightly low but stable since 9/9 No bleeding or infusion issues noted per RN  Goal of Therapy:  Heparin level 0.3-0.7 units/ml Monitor platelets by anticoagulation protocol: Yes   Plan:  Continue heparin at 1600 units/hr  Daily CBC and HL while on heparin Anticipate transition to Eliquis today When appropriate, begin Eliquis 10 mg PO bid x 7 days, followed by 5 mg PO bid thereafter Stop heparin with first dose of Eliquis Pharmacy to provide Eliquis counseling (and coupons, if applicable) prior to discharge Monitor for signs of bleeding   Makeshia Seat A, PharmD, BCPS Clinical Pharmacist 11/10/2021 7:22 AM

## 2021-11-10 NOTE — Discharge Summary (Signed)
Physician Discharge Summary  ADVAIT BUICE FWY:[id number] DOB: 06-Apr-1950 DOA: 11/07/2021  PCP: [name], [name], MD  Admit date: 11/07/2021 Discharge date: 11/10/2021 Recommendations for Outpatient Follow-up:  Follow up with PCP in 1 weeks-call for appointment Please obtain BMP/CBC in one week  Discharge Dispo: home Discharge Condition: Stable Code Status:   Code Status: Full Code Diet recommendation:  Diet Order             Diet regular Room service appropriate? Yes; Fluid consistency: Thin  Diet effective now                    Brief/Interim Summary: 71 y.o.m w/ history significant for CAD,hypertension, and DVT and PE in 2019 for which he completed 6 months of anticoagulation who presented with acute DVT and PE on outpatient imaging after  having 2 to 3 weeks of right leg swelling for which he had venous Dopplers positive for acute DVT involving the right femoral and right popliteal veins, and CTA showing acute pulmonary embolism with RV/LV ratio of 1.35.   He has chronic dyspnea that has worsened insidiously over the past 2 to 3 months but he denies any change in his chronic mild cough, denies hemoptysis, denies chest pain, and denies syncope or presyncope.  Denies any history of bleeding problems. In the ED hemodynamically stable.  Started on heparin drip and admitted.  Troponins unremarkable normal lactic acid CBC BMP stable with mild thrombocytopenia. Patient continued on heparin drip.  He remained hemodynamically stable, echo showed EF 65 to 70%, no RWMA right ventricular systolic function is low normal side is mildly enlarged  RV and RV systolic function is low normal.  Right lower leg swelling has improved, patient has been hemodynamically stable,repeat troponin negative He has been off oxygen ambulating in the hallway without issues.  Right leg swelling has resolved. transitioned to Eliquis p.o..  Medically stable for discharge home   Discharge Diagnoses:  Principal  Problem:   Acute pulmonary embolism ([place]) Active Problems:   Essential (primary) hypertension   Coronary artery disease   Thoracic aortic aneurysm without rupture ([facility])   Sinus bradycardia  Acute PE with right heart strain on CT RV/LV 1.35 Acute DVT right femoral and right popliteal vein Previous history of DVT/PE in 2019-treated with 6 months of anticoagulation: He has been off oxygen ambulating in the hallway without issues.  Right leg swelling has resolved.  Adequately treated with heparin infusion since admission> transitioned to Eliquis p.o today. Medically stable for discharge home.  Repeat troponins have been negative echo- lvef 65 to 70%, no RWMA right ventricular systolic function is low normal side is mildly enlarged  RV and RV systolic function is low normal.   HRN/CAD/Sinus bradycardia asymptomatic:stable, resume low-dose atenolol as HR >60/Min, cont ARBs,Crestor. Thoracic aortic aneurysm-noted in CT-he is aware for op f/u. Class I obesity with BMI 33.4 Will benefit with weight loss   Consults: none Subjective: Alert awake oriented resting comfortably no chest pain right leg swelling improved.  Discharge Exam: Vitals:   11/10/21 0825 11/10/21 1129  BP:    Pulse:  63  Resp:    Temp:    SpO2: 97% 98%   General: Pt is alert, awake, not in acute distress Cardiovascular: RRR, S1/S2 +, no rubs, no gallops Respiratory: CTA bilaterally, no wheezing, no rhonchi Abdominal: Soft, NT, ND, bowel sounds + Extremities: no edema, no cyanosis  Discharge Instructions  Discharge Instructions     Discharge instructions   Complete by:  As directed    Please call call MD or return to ER for similar or worsening recurring problem that brought you to hospital or if any fever,nausea/vomiting,abdominal pain, uncontrolled pain, chest pain,  shortness of breath or any other alarming symptoms.  Please follow-up your doctor as instructed in a week time and call the office for  appointment.  Please avoid alcohol, smoking, or any other illicit substance and maintain healthy habits including taking your regular medications as prescribed.  You were cared for by a hospitalist during your hospital stay. If you have any questions about your discharge medications or the care you received while you were in the hospital after you are discharged, you can call the unit and ask to speak with the hospitalist on call if the hospitalist that took care of you is not available.  Once you are discharged, your primary care physician will handle any further medical issues. Please note that NO REFILLS for any discharge medications will be authorized once you are discharged, as it is imperative that you return to your primary care physician (or establish a relationship with a primary care physician if you do not have one) for your aftercare needs so that they can reassess your need for medications and monitor your lab values   Increase activity slowly   Complete by: As directed       Allergies as of 11/10/2021       Reactions   Methotrexate Derivatives Other (See Comments)   Mouth ulcers   Cardura [doxazosin] Other (See Comments)   Aggravated prostate Oliguria Bladder pain and irritation Urinary hesitancy   Lipitor [atorvastatin] Rash   Penicillins Rash   DID THE REACTION INVOLVE: Swelling of the face/tongue/throat, SOB, or low BP? Y Sudden or severe rash/hives, skin peeling, or the inside of the mouth or nose? Y Did it require medical treatment? N When did it last happen?  1970 If all above answers are "NO", may proceed with cephalosporin use.   Zestril [lisinopril] Other (See Comments)   Depression Fatigue   Bystolic [nebivolol Hcl] Rash   Zocor [simvastatin] Rash        Medication List     TAKE these medications    acetaminophen 500 MG tablet Commonly known as: TYLENOL Take 1,500 mg by mouth daily as needed for fever, headache or moderate pain.   Apixaban Starter  Pack ('10mg'$  and '5mg'$ ) Commonly known as: ELIQUIS STARTER PACK Take as directed on package: start with two-'5mg'$  tablets twice daily for 7 days. On day 8, switch to one-'5mg'$  tablet twice daily.   aspirin EC 81 MG tablet Take 1 tablet (81 mg total) by mouth daily. Please discuss with your cardiology REGARDING aspirin since you are now on Eliquis What changed: additional instructions   atenolol 25 MG tablet Commonly known as: TENORMIN Take 0.5 tablets (12.5 mg total) by mouth daily.   Breztri Aerosphere 160-9-4.8 MCG/ACT Aero Generic drug: Budeson-Glycopyrrol-Formoterol Inhale 2 puffs into the lungs in the morning and at bedtime.   COQ10 PO Take 1 capsule by mouth daily.   FISH OIL OMEGA-3 PO Take 1 capsule by mouth daily.   fluticasone 50 MCG/ACT nasal spray Commonly known as: FLONASE Place 1 spray into both nostrils daily.   furosemide 20 MG tablet Commonly known as: LASIX Take 1 tablet (20 mg total) by mouth daily.   ipratropium 0.03 % nasal spray Commonly known as: ATROVENT Place 2 sprays into both nostrils every 12 (twelve) hours.   montelukast 10 MG tablet Commonly  known as: SINGULAIR Take 1 tablet (10 mg total) by mouth at bedtime.   nitroGLYCERIN 0.4 MG SL tablet Commonly known as: NITROSTAT Place 1 tablet (0.4 mg total) under the tongue every 5 (five) minutes as needed for chest pain.   rosuvastatin 20 MG tablet Commonly known as: CRESTOR TAKE 1 TABLET BY MOUTH EVERY DAY   valsartan 80 MG tablet Commonly known as: DIOVAN TAKE 1 TABLET BY MOUTH EVERY DAY   VITAMIN B-12 PO Take 1 tablet by mouth daily.   VITAMIN D-3 PO Take 1 capsule by mouth daily.        Follow-up Information     [name], [name], MD Follow up in 1 week(s).   Specialty: Family Medicine Contact information: [place] [address] [phone]                Allergies  Allergen Reactions   Methotrexate Derivatives Other (See Comments)    Mouth ulcers    Cardura [Doxazosin] Other (See Comments)    Aggravated prostate Oliguria Bladder pain and irritation Urinary hesitancy   Lipitor [Atorvastatin] Rash   Penicillins Rash    DID THE REACTION INVOLVE: Swelling of the face/tongue/throat, SOB, or low BP? Y Sudden or severe rash/hives, skin peeling, or the inside of the mouth or nose? Y Did it require medical treatment? N When did it last happen?  1970 If all above answers are "NO", may proceed with cephalosporin use.    Zestril [Lisinopril] Other (See Comments)    Depression Fatigue   Bystolic [Nebivolol Hcl] Rash   Zocor [Simvastatin] Rash    The results of significant diagnostics from this hospitalization (including imaging, microbiology, ancillary and laboratory) are listed below for reference.    Microbiology: No results found for this or any previous visit (from the past 240 hour(s)).  Procedures/Studies: ECHOCARDIOGRAM COMPLETE  Result Date: 11/08/2021    ECHOCARDIOGRAM REPORT   Patient Name:   JRAKE RODRIQUEZ Corbello Date of Exam: 11/08/2021 Medical Rec #:  [id number]        Height:       72.0 in Accession #:    [id number]       Weight:       246.4 lb Date of Birth:  [birth year]       BSA:          2.328 m Patient Age:    71 years         BP:           139/83 mmHg Patient Gender: M                HR:           50 bpm. Exam Location:  Inpatient Procedure: 2D Echo, Color Doppler and Cardiac Doppler Indications:     Pulmonary Embolus I26.09  History:         Patient has prior history of Echocardiogram examinations, most                  recent 08/24/2021. Risk Factors:Hypertension and Diabetes. Past                  history of DVT PE in 2019. Chronic kidney disease.  Sonographer:     Darlina Sicilian RDCS Referring Phys:  [id number] TIMOTHY S OPYD Diagnosing Phys: Vernell Leep MD IMPRESSIONS  1. Left ventricular ejection fraction, by estimation, is 65 to 70%. The left ventricle has normal function. The left ventricle has no regional wall motion  abnormalities. There is mild left ventricular hypertrophy. Left ventricular diastolic parameters are indeterminate.  2. Right ventricular systolic function is low normal. The right ventricular size is mildly enlarged.  3. Left atrial size was mildly dilated.  4. The mitral valve is normal in structure. Mild mitral valve regurgitation. No evidence of mitral stenosis.  5. The aortic valve is normal in structure. Aortic valve regurgitation is mild. No aortic stenosis is present.  6. Aortic dilatation noted. There is dilatation of the aortic root, measuring 39 mm. There is dilatation of the ascending aorta, measuring 38 mm. Comparison(s): Compared to outpatient stydy in 07/2020, RV dilatation is new. FINDINGS  Left Ventricle: Left ventricular ejection fraction, by estimation, is 65 to 70%. The left ventricle has normal function. The left ventricle has no regional wall motion abnormalities. The left ventricular internal cavity size was normal in size. There is  mild left ventricular hypertrophy. Left ventricular diastolic parameters are indeterminate. Right Ventricle: The right ventricular size is mildly enlarged. No increase in right ventricular wall thickness. Right ventricular systolic function is low normal. Left Atrium: Left atrial size was mildly dilated. Right Atrium: Right atrial size was normal in size. Pericardium: There is no evidence of pericardial effusion. Mitral Valve: The mitral valve is normal in structure. Mild mitral valve regurgitation. No evidence of mitral valve stenosis. Tricuspid Valve: The tricuspid valve is normal in structure. Tricuspid valve regurgitation is mild. Aortic Valve: The aortic valve is normal in structure. Aortic valve regurgitation is mild. No aortic stenosis is present. Pulmonic Valve: The pulmonic valve was normal in structure. Pulmonic valve regurgitation is not visualized. No evidence of pulmonic stenosis. Aorta: Aortic dilatation noted. There is dilatation of the aortic root,  measuring 39 mm. There is dilatation of the ascending aorta, measuring 38 mm. IAS/Shunts: The interatrial septum was not assessed.  LEFT VENTRICLE PLAX 2D LVIDd:         4.80 cm      Diastology LVIDs:         2.90 cm      LV e' medial:    6.85 cm/s LV PW:         1.20 cm      LV E/e' medial:  10.8 LV IVS:        1.30 cm      LV e' lateral:   12.10 cm/s LVOT diam:     2.40 cm      LV E/e' lateral: 6.1 LV SV:         96 LV SV Index:   41 LVOT Area:     4.52 cm  LV Volumes (MOD) LV vol d, MOD A2C: 144.0 ml LV vol d, MOD A4C: 148.0 ml LV vol s, MOD A2C: 47.3 ml LV vol s, MOD A4C: 64.4 ml LV SV MOD A2C:     96.7 ml LV SV MOD A4C:     148.0 ml LV SV MOD BP:      90.5 ml RIGHT VENTRICLE RV Basal diam:  6.00 cm RV Mid diam:    4.35 cm RV S prime:     9.03 cm/s TAPSE (M-mode): 1.9 cm LEFT ATRIUM             Index        RIGHT ATRIUM           Index LA diam:        4.40 cm 1.89 cm/m   RA Area:     17.50 cm LA Vol (A2C):  99.7 ml 42.82 ml/m  RA Volume:   43.10 ml  18.51 ml/m LA Vol (A4C):   80.9 ml 34.75 ml/m LA Biplane Vol: 92.4 ml 39.69 ml/m  AORTIC VALVE LVOT Vmax:   79.00 cm/s LVOT Vmean:  56.200 cm/s LVOT VTI:    0.212 m  AORTA Ao Root diam: 3.90 cm MITRAL VALVE MV Area (PHT): 2.48 cm    SHUNTS MV Decel Time: 306 msec    Systemic VTI:  0.21 m MV E velocity: 73.70 cm/s  Systemic Diam: 2.40 cm MV A velocity: 42.00 cm/s MV E/A ratio:  1.75 Vernell Leep MD Electronically signed by Vernell Leep MD Signature Date/Time: 11/08/2021/11:49:07 AM    Final    CT Angio Chest Pulmonary Embolism (PE) W or WO Contrast  Result Date: 11/07/2021 CLINICAL DATA:  Shortness of breath. Lower extremity DVT. High probability for pulmonary embolism. EXAM: CT ANGIOGRAPHY CHEST WITH CONTRAST TECHNIQUE: Multidetector CT imaging of the chest was performed using the standard protocol during bolus administration of intravenous contrast. Multiplanar CT image reconstructions and MIPs were obtained to evaluate the vascular anatomy.  RADIATION DOSE REDUCTION: This exam was performed according to the departmental dose-optimization program which includes automated exposure control, adjustment of the mA and/or kV according to patient size and/or use of iterative reconstruction technique. CONTRAST:  51m OMNIPAQUE IOHEXOL 350 MG/ML SOLN COMPARISON:  Noncontrast chest CT on 06/13/2021 FINDINGS: Cardiovascular: Acute pulmonary embolism is seen within the distal right pulmonary artery, as well as the right upper and lower lobar pulmonary arteries. No left-sided pulmonary emboli are identified. Elevated RV LV ratio of 1.35 is seen, consistent with right heart strain. 4.2 cm ascending thoracic aortic aneurysm is noted. No evidence of thoracic aortic dissection. Mediastinum/Nodes: No masses or pathologically enlarged lymph nodes identified. Lungs/Pleura: No pulmonary mass, infiltrate, or effusion. Upper abdomen: No acute findings. Musculoskeletal: No suspicious bone lesions identified. Review of the MIP images confirms the above findings. IMPRESSION: Positive for acute PE with CT evidence of right heart strain (RV/LV Ratio = 1.35) consistent with at least submassive (intermediate risk) PE. The presence of right heart strain has been associated with an increased risk of morbidity and mortality. Please refer to the "Code PE Focused" order set in EPIC. 4.2 cm ascending thoracic aortic aneurysm. Recommend annual imaging followup by CT. This recommendation follows 2010 ACCF/AHA/AATS/ACR/ASA/SCA/SCAI/SIR/STS/SVM Guidelines for the Diagnosis and Management of Patients with Thoracic Aortic Disease. Circulation. 2010; 121:: B147-W295 Aortic aneurysm NOS (ICD10-I71.9) Critical Value/emergent results were called by telephone at the time of interpretation on 11/07/2021 at 6:34 pm to provider [facility], who verbally acknowledged these results. Electronically Signed   By: JMarlaine HindM.D.   On: 11/07/2021 18:45   VAS [place]OWER EXTREMITY VENOUS (DVT)  Result  Date: 11/07/2021  Lower Venous DVT Study Patient Name:  Willie Dominguez  Date of Exam:   11/07/2021 Medical Rec #: [id number]        Accession #:    [id number] of Birth: 116-Aug-1952       Patient Gender: M Patient Age:   89years [facility]      VAS [place]OWER EXTREMITY VENOUS (DVT) Referring Phys: JFreda Jackson--------------------------------------------------------------------------------  Indications: Edema.  Comparison Study: 03/28/18 prior Performing Technologist: MArchie PattenRVS  Examination Guidelines: A complete evaluation includes B-mode imaging, spectral Doppler, color Doppler, and power Doppler as needed of all accessible portions of each vessel. Bilateral testing is considered an integral part of a  complete examination. Limited examinations for reoccurring indications may be performed as noted. The reflux portion of the exam is performed with the patient in reverse Trendelenburg.  +---------+---------------+---------+-----------+----------+-------------------+ RIGHT    CompressibilityPhasicitySpontaneityPropertiesThrombus Aging      +---------+---------------+---------+-----------+----------+-------------------+ CFV      Full           Yes      Yes                                      +---------+---------------+---------+-----------+----------+-------------------+ SFJ      Full                                                             +---------+---------------+---------+-----------+----------+-------------------+ FV Prox  None                                         Acute               +---------+---------------+---------+-----------+----------+-------------------+ FV Mid   None                                         Acute               +---------+---------------+---------+-----------+----------+-------------------+ FV DistalNone                                         Acute                +---------+---------------+---------+-----------+----------+-------------------+ PFV      Full                                                             +---------+---------------+---------+-----------+----------+-------------------+ POP      None           No       No                   Acute               +---------+---------------+---------+-----------+----------+-------------------+ PTV      Full                                                             +---------+---------------+---------+-----------+----------+-------------------+ PERO                                                  patent by color-  not well visualized +---------+---------------+---------+-----------+----------+-------------------+   +---------+---------------+---------+-----------+----------+--------------+ LEFT     CompressibilityPhasicitySpontaneityPropertiesThrombus Aging +---------+---------------+---------+-----------+----------+--------------+ CFV      Full           Yes      Yes                                 +---------+---------------+---------+-----------+----------+--------------+ SFJ      Full                                                        +---------+---------------+---------+-----------+----------+--------------+ FV Prox  Full                                                        +---------+---------------+---------+-----------+----------+--------------+ FV Mid   Full                                                        +---------+---------------+---------+-----------+----------+--------------+ FV DistalFull                                                        +---------+---------------+---------+-----------+----------+--------------+ PFV      Full                                                         +---------+---------------+---------+-----------+----------+--------------+ POP      Full           Yes      Yes                                 +---------+---------------+---------+-----------+----------+--------------+ PTV      Full                                                        +---------+---------------+---------+-----------+----------+--------------+ PERO     Full                                                        +---------+---------------+---------+-----------+----------+--------------+     Summary: RIGHT: - Findings consistent with acute deep vein thrombosis involving the right femoral vein, and right popliteal vein. - No cystic structure found in the popliteal fossa.  LEFT: - There is no evidence of  deep vein thrombosis in the lower extremity.  - A cystic structure is found in the popliteal fossa.  *See table(s) above for measurements and observations. Electronically signed by Orlie Pollen on 11/07/2021 at 4:08:39 PM.    Final     Labs: BNP (last 3 results) Recent Labs    03/19/21 2151 11/07/21 1939  BNP 58.9 95.1   Basic Metabolic Panel: Recent Labs  Lab 11/07/21 1939 11/08/21 0747 11/09/21 0559 11/10/21 0533  NA 136 138 138 140  K 4.2 4.6 4.3 4.2  CL 103 106 106 110  CO2 '23 26 26 25  '$ GLUCOSE 97 101* 110* 113*  BUN '18 18 15 15  '$ CREATININE 1.09 1.14 1.14 1.07  CALCIUM 9.1 8.8* 8.7* 9.0   Liver Function Tests: No results for input(s): "AST", "ALT", "ALKPHOS", "BILITOT", "PROT", "ALBUMIN" in the last 168 hours. No results for input(s): "LIPASE", "AMYLASE" in the last 168 hours. No results for input(s): "AMMONIA" in the last 168 hours. CBC: Recent Labs  Lab 11/07/21 1939 11/08/21 0747 11/09/21 0559 11/10/21 0533  WBC 5.9 4.8 4.2 3.7*  HGB 13.0 11.9* 11.5* 11.9*  HCT 40.6 37.5* 36.4* 37.8*  MCV 95.3 95.2 94.8 95.2  PLT 133* 116* 119* 126*   Cardiac Enzymes: No results for input(s): "CKTOTAL", "CKMB", "CKMBINDEX", "TROPONINI" in the last  168 hours. BNP: Invalid input(s): "POCBNP" CBG: No results for input(s): "GLUCAP" in the last 168 hours. D-Dimer No results for input(s): "DDIMER" in the last 72 hours. Hgb A1c No results for input(s): "HGBA1C" in the last 72 hours. Lipid Profile No results for input(s): "CHOL", "HDL", "LDLCALC", "TRIG", "CHOLHDL", "LDLDIRECT" in the last 72 hours. Thyroid function studies No results for input(s): "TSH", "T4TOTAL", "T3FREE", "THYROIDAB" in the last 72 hours.  Invalid input(s): "FREET3" Anemia work up No results for input(s): "VITAMINB12", "FOLATE", "FERRITIN", "TIBC", "IRON", "RETICCTPCT" in the last 72 hours. Urinalysis    Component Value Date/Time   COLORURINE yellow 08/09/2008 0933   APPEARANCEUR Clear 08/09/2008 0933   LABSPEC >=1.030 08/09/2008 0933   PHURINE 5.0 08/09/2008 0933   HGBUR negative 08/09/2008 0933   BILIRUBINUR neg 01/01/2016 1038   PROTEINUR + 01/01/2016 1038   UROBILINOGEN 0.2 01/01/2016 1038   UROBILINOGEN 0.2 08/09/2008 0933   NITRITE neg 01/01/2016 1038   NITRITE negative 08/09/2008 0933   LEUKOCYTESUR Negative 01/01/2016 1038   Sepsis Labs Recent Labs  Lab 11/07/21 1939 11/08/21 0747 11/09/21 0559 11/10/21 0533  WBC 5.9 4.8 4.2 3.7*   Microbiology No results found for this or any previous visit (from the past 240 hour(s)).   Time coordinating discharge: 25 minutes  SIGNED: Antonieta Pert, MD  [facility] 11/10/2021, 11:54 AM  If 7PM-7AM, please contact night-coverage [email or web address]

## 2021-11-10 NOTE — Discharge Instructions (Signed)
Information on my medicine - ELIQUIS (apixaban)  This medication education was reviewed with me or my healthcare representative as part of my discharge preparation.  The pharmacist that spoke with me during my hospital stay was:  Anwar Crill A, RPH  Why was Eliquis prescribed for you? Eliquis was prescribed to treat blood clots that may have been found in the veins of your legs (deep vein thrombosis) or in your lungs (pulmonary embolism) and to reduce the risk of them occurring again.  What do You need to know about Eliquis ? The starting dose is 10 mg (two 5 mg tablets) taken TWICE daily for the FIRST SEVEN (7) DAYS, then on (enter date)  11/17/2021  the dose is reduced to ONE 5 mg tablet taken TWICE daily.  Eliquis may be taken with or without food.   Try to take the dose about the same time in the morning and in the evening. If you have difficulty swallowing the tablet whole please discuss with your pharmacist how to take the medication safely.  Take Eliquis exactly as prescribed and DO NOT stop taking Eliquis without talking to the doctor who prescribed the medication.  Stopping may increase your risk of developing a new blood clot.  Refill your prescription before you run out.  After discharge, you should have regular check-up appointments with your healthcare provider that is prescribing your Eliquis.    What do you do if you miss a dose? If a dose of ELIQUIS is not taken at the scheduled time, take it as soon as possible on the same day and twice-daily administration should be resumed. The dose should not be doubled to make up for a missed dose.  Important Safety Information A possible side effect of Eliquis is bleeding. You should call your healthcare provider right away if you experience any of the following: Bleeding from an injury or your nose that does not stop. Unusual colored urine (red or dark brown) or unusual colored stools (red or black). Unusual bruising for  unknown reasons. A serious fall or if you hit your head (even if there is no bleeding).  Some medicines may interact with Eliquis and might increase your risk of bleeding or clotting while on Eliquis. To help avoid this, consult your healthcare provider or pharmacist prior to using any new prescription or non-prescription medications, including herbals, vitamins, non-steroidal anti-inflammatory drugs (NSAIDs) and supplements.  This website has more information on Eliquis (apixaban): http://www.eliquis.com/eliquis/home

## 2021-11-10 NOTE — Progress Notes (Signed)
Discharge instructions gone over with Patient and his wife. Dr. Maren Beach in to speak with wife and patient before his discharge. Appetite is good Heart rate while ambulating was 63. Heparin gtt stopped as ordered and patient started on Eliquis PO. Information about Eliquis given to patient by pharmacist. Willie Dominguez was discharge via wheelchair to wife.

## 2021-11-10 NOTE — TOC Initial Note (Signed)
Transition of Care [facility]) - Initial/Assessment Note    Patient Details  Name: Willie Dominguez MRN: [id number] Date of Birth: 09/11/1950  Transition of Care [facility]) CM/SW Contact:    Leeroy Cha, RN Phone Number: 11/10/2021, 8:48 AM  Clinical Narrative:                  Transition of Care [facility]) Screening Note   Patient Details  Name: Willie Dominguez Date of Birth: [birth year]   Transition of Care [facility]) CM/SW Contact:    Leeroy Cha, RN Phone Number: 11/10/2021, 8:48 AM    Transition of Care Department [facility]) has reviewed patient and no TOC needs have been identified at this time. We will continue to monitor patient advancement through interdisciplinary progression rounds. If new patient transition needs arise, please place a TOC consult.    Expected Discharge Plan: Home/Self Care Barriers to Discharge: Continued Medical Work up   Patient Goals and CMS Choice Patient states their goals for this hospitalization and ongoing recovery are:: to go home CMS Medicare.gov Compare Post Acute Care list provided to:: Patient    Expected Discharge Plan and Services Expected Discharge Plan: Home/Self Care   Discharge Planning Services: CM Consult   Living arrangements for the past 2 months: Single Family Home                                      Prior Living Arrangements/Services Living arrangements for the past 2 months: Single Family Home Lives with:: Spouse Patient language and need for interpreter reviewed:: Yes Do you feel safe going back to the place where you live?: Yes            Criminal Activity/Legal Involvement Pertinent to Current Situation/Hospitalization: No - Comment as needed  Activities of Daily Living Home Assistive Devices/Equipment: Eyeglasses ADL Screening (condition at time of admission) Patient's cognitive ability adequate to safely complete daily activities?: Yes Is the patient deaf or have difficulty hearing?: No Does the  patient have difficulty seeing, even when wearing glasses/contacts?: No Does the patient have difficulty concentrating, remembering, or making decisions?: No Patient able to express need for assistance with ADLs?: Yes Does the patient have difficulty dressing or bathing?: No Independently performs ADLs?: Yes (appropriate for developmental age) Does the patient have difficulty walking or climbing stairs?: Yes (gets sob) Weakness of Legs: None Weakness of Arms/Hands: None  Permission Sought/Granted                  Emotional Assessment Appearance:: Appears stated age Attitude/Demeanor/Rapport: Engaged Affect (typically observed): Calm Orientation: : Oriented to Self, Oriented to Place, Oriented to  Time, Oriented to Situation Alcohol / Substance Use: Alcohol Use Psych Involvement: No (comment)  Admission diagnosis:  Acute pulmonary embolism ([place]) [I26.99] Other acute pulmonary embolism, unspecified whether acute cor pulmonale present ([place]) [I26.99] Patient Active Problem List   Diagnosis Date Noted   Acute pulmonary embolism ([place]) 11/07/2021   Sinus bradycardia 11/07/2021   Aneurysm of ascending aorta without rupture ([place]) 08/07/2021   Thoracic aortic aneurysm without rupture ([place]) 04/17/2020   Cecal polyp 01/06/2020   Personal history of colonic polyps 01/06/2020   Abnormal colonoscopy 01/06/2020   Long term (current) use of antithrombotics/antiplatelets 01/06/2020   Coronary artery disease 04/27/2019   Cough 04/27/2019   Abnormal stress test 01/04/2019   History of pulmonary embolism 02/16/2018   Impaired glucose tolerance 09/28/2016  Family history of coronary artery disease in father 09/28/2016   Paresthesia 05/02/2013   Hereditary and idiopathic peripheral neuropathy 04/17/2013   Exertional dyspnea 08/28/2011   Fatigue 08/28/2011   ABDOMINAL PAIN 08/30/2008   URTICARIA 08/09/2008   URINARY RETENTION 08/09/2008   Exertional chest pain 04/16/2008   Dyslipidemia  09/20/2007   BPH (benign prostatic hyperplasia) 09/20/2007   UNS ADVRS EFF UNS RX MEDICINAL&BIOLOGICAL SBSTNC 07/04/2007   Essential (primary) hypertension 02/03/2007   NEPHROLITHIASIS, HX OF 02/03/2007   PCP:  [name], [name], MD Pharmacy:   [id number] [place], [place] - [address] [place] [id number]: [phone]: [phone] [facility] [address]: [phone]: [phone]    Social Determinants of Health (SDOH) Interventions    Readmission Risk Interventions   No data to display

## 2021-11-11 ENCOUNTER — Telehealth: Payer: Self-pay

## 2021-11-11 NOTE — Telephone Encounter (Signed)
Transition Care Management Follow-up Telephone Call Date of discharge and from where: [place] 11-10-21 Dx: Acute Pulmonary embolism How have you been since you were released from the hospital? Doing fine  Any questions or concerns? No  Items Reviewed: Did the pt receive and understand the discharge instructions provided? Yes  Medications obtained and verified? Yes  Other? No  Any new allergies since your discharge? No  Dietary orders reviewed? Yes Do you have support at home? Yes   Home Care and Equipment/Supplies: Were home health services ordered? no If so, what is the name of the agency? na  Has the agency set up a time to come to the patient's home? not applicable Were any new equipment or medical supplies ordered?  No What is the name of the medical supply agency? na Were you able to get the supplies/equipment? not applicable Do you have any questions related to the use of the equipment or supplies? No  Functional Questionnaire: (I = Independent and D = Dependent) ADLs: I  Bathing/Dressing- I  Meal Prep- I  Eating- I  Maintaining continence- I  Transferring/Ambulation- I  Managing Meds- I  Follow up appointments reviewed:  PCP Hospital f/u appt confirmed? Yes  Scheduled to see Dr Volanda Napoleon on 11-17-21 @ 230pm. [place] [facility] f/u appt confirmed? No- pt aware to make fu appt with pulmonologist- and Dr Virgina Jock on 12-05-21 at 930am Are transportation arrangements needed? No  If their condition worsens, is the pt aware to call PCP or go to the Emergency Dept.? Yes Was the patient provided with contact information for the PCP's office or ED? Yes Was to pt encouraged to call back with questions or concerns? Yes
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
## 2021-11-17 ENCOUNTER — Encounter: Payer: Self-pay | Admitting: Family Medicine

## 2021-11-17 ENCOUNTER — Ambulatory Visit (INDEPENDENT_AMBULATORY_CARE_PROVIDER_SITE_OTHER): Payer: 59 | Admitting: Family Medicine

## 2021-11-17 ENCOUNTER — Telehealth: Payer: Self-pay | Admitting: Pulmonary Disease

## 2021-11-17 VITALS — BP 110/70 | HR 71 | Resp 12 | Ht 72.0 in | Wt 244.0 lb

## 2021-11-17 DIAGNOSIS — D696 Thrombocytopenia, unspecified: Secondary | ICD-10-CM

## 2021-11-17 DIAGNOSIS — I2699 Other pulmonary embolism without acute cor pulmonale: Secondary | ICD-10-CM | POA: Diagnosis not present

## 2021-11-17 DIAGNOSIS — R739 Hyperglycemia, unspecified: Secondary | ICD-10-CM | POA: Diagnosis not present

## 2021-11-17 DIAGNOSIS — I2690 Septic pulmonary embolism without acute cor pulmonale: Secondary | ICD-10-CM

## 2021-11-17 DIAGNOSIS — Z6833 Body mass index (BMI) 33.0-33.9, adult: Secondary | ICD-10-CM | POA: Insufficient documentation

## 2021-11-17 DIAGNOSIS — I7121 Aneurysm of the ascending aorta, without rupture: Secondary | ICD-10-CM

## 2021-11-17 DIAGNOSIS — I10 Essential (primary) hypertension: Secondary | ICD-10-CM | POA: Diagnosis not present

## 2021-11-17 DIAGNOSIS — E669 Obesity, unspecified: Secondary | ICD-10-CM

## 2021-11-17 LAB — CBC
HCT: 38.3 % — ABNORMAL LOW (ref 39.0–52.0)
Hemoglobin: 13 g/dL (ref 13.0–17.0)
MCHC: 33.9 g/dL (ref 30.0–36.0)
MCV: 89.9 fl (ref 78.0–100.0)
Platelets: 163 10*3/uL (ref 150.0–400.0)
RBC: 4.26 Mil/uL (ref 4.22–5.81)
RDW: 15.2 % (ref 11.5–15.5)
WBC: 5 10*3/uL (ref 4.0–10.5)

## 2021-11-17 LAB — BASIC METABOLIC PANEL
BUN: 20 mg/dL (ref 6–23)
CO2: 24 mEq/L (ref 19–32)
Calcium: 9.6 mg/dL (ref 8.4–10.5)
Chloride: 103 mEq/L (ref 96–112)
Creatinine, Ser: 1.23 mg/dL (ref 0.40–1.50)
GFR: 59.35 mL/min — ABNORMAL LOW (ref >60.00)
Glucose, Bld: 95 mg/dL (ref 70–99)
Potassium: 4.4 mEq/L (ref 3.5–5.1)
Sodium: 137 mEq/L (ref 135–145)

## 2021-11-17 LAB — HEMOGLOBIN A1C: Hgb A1c MFr Bld: 5.6 % (ref 4.6–6.5)

## 2021-11-17 MED ORDER — APIXABAN 5 MG PO TABS: 5.0000 mg | ORAL_TABLET | Freq: Two times a day (BID) | ORAL | 11 refills | Status: DC

## 2021-11-17 NOTE — Assessment & Plan Note (Signed)
We discussed results of recent chest CTA. History of the importance of adequate BP control. Due to bradycardia beta-blocker, atenolol, was discontinued. We will continue following with annual chest CT. Instructed about warning signs.

## 2021-11-17 NOTE — Assessment & Plan Note (Signed)
His weight has been otherwise stable sine his last visit. He understands the benefits of wt loss as well as adverse effects of obesity. Consistency with healthy diet and physical activity as tolerated  encouraged. Making positive life style changes, small steps at the time, will provide long lasting results with minimal risk for adverse effects; so I do not recommend pharmacologic treatment. For now he prefers to hold on referral to weight loss clinic.

## 2021-11-17 NOTE — Assessment & Plan Note (Signed)
BP adequately controlled. Continue valsartan 80 mg daily and low-salt diet. Return BP regularly.

## 2021-11-17 NOTE — Patient Instructions (Addendum)
A few things to remember from today's visit:   Aneurysm of ascending aorta without rupture ([place])  Other acute pulmonary embolism, unspecified whether acute cor pulmonale present (HCC)  Thrombocytopenia ([place]) - Plan: CBC  Essential (primary) hypertension - Plan: Basic metabolic panel  If you need refills for medications you take chronically, please call your pharmacy. Do not use My Chart to request refills or for acute issues that need immediate attention. If you send a my chart message, it may take a few days to be addressed, specially if I am not in the office.  Please be sure medication list is accurate. If a new problem present, please set up appointment sooner than planned today.  No changes today. Continue monitoring blood pressure at home. Aortic aneurysm needs to be follow annually with chest CT.

## 2021-11-17 NOTE — Telephone Encounter (Signed)
Spoke with patient to see how he was doing after recent hospitalization. He has been doing well and has stopped taking atenolol. He will complete eliquis starter pack in 1 week. Will send in eliquis prescription to continue '5mg'$  twice daily.  Follow up in December as scheduled.  Freda Jackson, MD [facility] Office: [phone]   See Amion for personal pager PCCM on call pager [phone] until 7pm. Please call Elink 7p-7a. [phone]

## 2021-11-17 NOTE — Assessment & Plan Note (Signed)
This is his 2nd event, 1st one in 2019. Continue Eliquis 5 mg twice daily. We discussed some side effects of medication.

## 2021-12-05 ENCOUNTER — Encounter: Payer: Self-pay | Admitting: Cardiology

## 2021-12-05 ENCOUNTER — Ambulatory Visit: Payer: 59 | Admitting: Cardiology

## 2021-12-05 VITALS — BP 116/74 | HR 64 | Temp 97.7°F | Resp 16 | Ht 72.0 in | Wt 242.0 lb

## 2021-12-05 DIAGNOSIS — R0609 Other forms of dyspnea: Secondary | ICD-10-CM

## 2021-12-05 DIAGNOSIS — I25118 Atherosclerotic heart disease of native coronary artery with other forms of angina pectoris: Secondary | ICD-10-CM

## 2021-12-05 DIAGNOSIS — Z86711 Personal history of pulmonary embolism: Secondary | ICD-10-CM

## 2021-12-05 DIAGNOSIS — I7121 Aneurysm of the ascending aorta, without rupture: Secondary | ICD-10-CM

## 2021-12-05 DIAGNOSIS — I10 Essential (primary) hypertension: Secondary | ICD-10-CM

## 2021-12-05 MED ORDER — VALSARTAN 80 MG PO TABS: 80.0000 mg | ORAL_TABLET | Freq: Every day | ORAL | 3 refills | Status: DC

## 2021-12-05 MED ORDER — FUROSEMIDE 20 MG PO TABS: 20.0000 mg | ORAL_TABLET | Freq: Every day | ORAL | 3 refills | Status: DC

## 2021-12-05 NOTE — Progress Notes (Signed)
Patient referred by [name], [name], MD for exertional chest pain and dyspnea  Subjective:   Willie Dominguez, male    DOB: [birth year], 71 y.o.   MRN: [id number]   No chief complaint on file.   HPI  71 year old Caucasian male with hypertension, hyperlipidemia, CAD, recurrent DVT/PE, family h/o CAD, mild TAA  Patient recently had worsening dyspnea on right leg edema symptoms, that prompted ER visit.  Work-up showed DVT in the right femoral-popliteal vein, as well as acute PE with right heart strain.  He was hemodynamically stable.  He was treated with heparin, transition to Eliquis.  Given his bradycardia, and possible chronotropic incompetence for monitoring, it was discontinued.  He continues to have exertional dyspnea.  Heart rate seems to have increased after stopping atenolol.  Blood pressure is well controlled.  In the past, patient has undergone PCI's to his obstructive CAD, without any improvement in his dyspnea symptoms.  Therefore, it was deemed that his dyspnea is unrelated to his CAD.   Current Outpatient Medications:    acetaminophen (TYLENOL) 500 MG tablet, Take 1,500 mg by mouth daily as needed for fever, headache or moderate pain., Disp: , Rfl:    apixaban (ELIQUIS) 5 MG TABS tablet, Take 1 tablet (5 mg total) by mouth 2 (two) times daily., Disp: 60 tablet, Rfl: 11   APIXABAN (ELIQUIS) VTE STARTER PACK (10MG AND 5MG), Take as directed on package: start with two-94m tablets twice daily for 7 days. On day 8, switch to one-549mtablet twice daily., Disp: 74 each, Rfl: 0   Budeson-Glycopyrrol-Formoterol (BREZTRI AEROSPHERE) 160-9-4.8 MCG/ACT AERO, Inhale 2 puffs into the lungs in the morning and at bedtime., Disp: 10.7 g, Rfl: 6   Cholecalciferol (VITAMIN D-3 PO), Take 1 capsule by mouth daily., Disp: , Rfl:    Coenzyme Q10 (COQ10 PO), Take 1 capsule by mouth daily., Disp: , Rfl:    Cyanocobalamin (VITAMIN B-12 PO), Take 1 tablet by mouth daily., Disp: , Rfl:    fluticasone  (FLONASE) 50 MCG/ACT nasal spray, Place 1 spray into both nostrils daily., Disp: 16 g, Rfl: 2   furosemide (LASIX) 20 MG tablet, Take 1 tablet (20 mg total) by mouth daily., Disp: 30 tablet, Rfl: 3   ipratropium (ATROVENT) 0.03 % nasal spray, Place 2 sprays into both nostrils every 12 (twelve) hours., Disp: 30 mL, Rfl: 12   montelukast (SINGULAIR) 10 MG tablet, Take 1 tablet (10 mg total) by mouth at bedtime., Disp: 30 tablet, Rfl: 11   nitroGLYCERIN (NITROSTAT) 0.4 MG SL tablet, Place 1 tablet (0.4 mg total) under the tongue every 5 (five) minutes as needed for chest pain., Disp: 30 tablet, Rfl: 3   Omega-3 Fatty Acids (FISH OIL OMEGA-3 PO), Take 1 capsule by mouth daily., Disp: , Rfl:    rosuvastatin (CRESTOR) 20 MG tablet, TAKE 1 TABLET BY MOUTH EVERY DAY (Patient taking differently: Take 20 mg by mouth daily.), Disp: 30 tablet, Rfl: 11   valsartan (DIOVAN) 80 MG tablet, TAKE 1 TABLET BY MOUTH EVERY DAY, Disp: 30 tablet, Rfl: 1  Cardiovascular studies:  CTA chest 11/12/2021: Positive for acute PE with CT evidence of right heart strain (RV/LV Ratio = 1.35) consistent with at least submassive (intermediate risk) PE. The presence of right heart strain has been associated with an increased risk of morbidity and mortality. Please refer to the "Code PE Focused" order set in EPIC.   4.2 cm ascending thoracic aortic aneurysm. Recommend annual imaging followup by CT. This recommendation follows  2010 ACCF/AHA/AATS/ACR/ASA/SCA/SCAI/SIR/STS/SVM Guidelines for the Diagnosis and Management of Patients with Thoracic Aortic Disease. Circulation. 2010; 121: M768-G881. Aortic aneurysm NOS (ICD10-I71.9)   Critical Value/emergent results were called by telephone at the time of interpretation on 11/07/2021 at 6:34 pm to provider [facility] , who verbally acknowledged these results.  CPEX 09/25/2021: Mild to moderate functional limitation which is multifactorial. At peak exercise, patient is primarily  limited by his obesity and related restrictive lung physiology. However the VE/VCO2 slope is also significantly elevated and suggests a circulatory limitation with elevated filling pressures during exercise. There is also mild chronotropic incompetence. If symptoms do not improve with weight loss can consider repeat exercise testing with measurement of invasive hemodynamics.    Echocardiogram 08/21/2021:  Left ventricle cavity is normal in size and wall thickness. Normal global  wall motion. Normal LV systolic function with EF 69%. Indeterminate  diastolic filling pattern.  Left atrial cavity is mildly dilated. Aneurysmal interatrial septum  without 2D or color Doppler evidence of shunting.  Structurally normal trileaflet aortic valve.  Trace aortic regurgitation.  Mild (Grade I) mitral regurgitation.  Aortic root dilated, 3.7 cm sinus of Valsalva. Ascending aortic aneurysm  4.3 cm.  No evidence of pulmonary hypertension.  Unlike previous study in 2020, RV dilatation is not appreciated.   Coronary intervention 10/03/2019: LM: Normal LAD: Patent prox LAD stent.        Mid 40% diffuse disease.        Post PCI dFR in 04/2019 was 0.95 RI: Prox 80% disease. Small caliber vessel. LCx: No significant stenosis. RCA: RPDA main branch with 90% stenosis. Another septal branch with diffuse 70% disease in small caliber vessel.        Successful cutting balloon PTCA and stent placement 2.25 X 18 mm Resolute Onyx drug-eluting stent  LHC/RHC/coronary intervention 04/18/2019: LM: Normal LAD: Prox-mid 80% stenosis and LAD/D1 bifurcation (1,0,0). Moderate disease in prox Diag.         LAD dFR 0.87        Successful percutaneous coronary intervention prox LAD        PTCA and stent placement 2.75 X 12 mm Resolute Onyx drug-eluting stent        Post dilatation with 3.0X8 mm Loraine balloon           Post PCI LAD dFR 0.95 RI: Prox 80% disease. Small caliber vessel. LCx: No significant stenosis. RCA: Distal 40%,  PDA bifurcation 90% stenosis in small caliber vessel.   Right heart cath: RA: 5 mmHg RV: 27/6 mmHg PA: 26/14 mmHg, mean PA 19 mmHg PCW: 15 mmHg   If residual dyspnea symptoms, will stage PDA bifurcation stenosis stenting in future.  Recent labs: 11/17/2021: Glucose 95, BUN/Cr 20/1.23. EGFR 59. Na/K 137/4.4.  H/H 13/38. MCV 89. Platelets 163 HbA1C 5.6%  08/14/2021: Chol 116, TG 111, HDL 46, LDL 50     Review of Systems  Review of Systems  Cardiovascular:  Positive for dyspnea on exertion. Negative for chest pain, leg swelling, palpitations and syncope.  Respiratory:  Positive for cough and shortness of breath.           Vitals:   12/05/21 0924  BP: 116/74  Pulse: 64  Resp: 16  Temp: 97.7 F (36.5 C)  SpO2: 95%     Body mass index is 32.82 kg/m. Filed Weights   12/05/21 0924  Weight: 242 lb (109.8 kg)     Objective:  Physical Exam Vitals and nursing note reviewed.  Constitutional:  General: He is not in acute distress. Neck:     Vascular: No JVD.  Cardiovascular:     Rate and Rhythm: Normal rate and regular rhythm.     Heart sounds: Normal heart sounds. No murmur heard. Pulmonary:     Effort: Pulmonary effort is normal.     Breath sounds: Normal breath sounds. No wheezing or rales.  Musculoskeletal:     Right lower leg: Edema (Trace) present.     Left lower leg: Edema (Trace) present.        Assessment & Recommendations:   70 year old Caucasian male with hypertension, hyperlipidemia, history of bilateral lower extremity DVT and PE (01/2018), CAD, persistent dyspnea and new cough.  Exertional dyspnea: Multifactorial, including recurrent DVT/PE.  Continues to have exercises induced cough.  I will defer to pulmonology to discuss if this could be exercise-induced asthma. As previously mentioned, it is unlikely that his residual dyspnea is related to his CAD, as it never improved after successful PCI to obstructive CAD stenoses. Given his  recent PE, we will repeat echocardiogram in 05/2022 to look for improvement in RV strain seen on CT angiogram in 10/2021.  CAD: Not on statin due ongoing use of eliquis. Continue statin.  Thoracic aorta aneurysm: 4.4 cm. We will repeat CT angiogram chest and 10/2022.  Will order at next visit.    Hypertension:  Well-controlled.   Refilling valsartan and Lasix.  F/u in 06/2022   Nigel Mormon, MD Pager: [phone] Office: [phone]

## 2021-12-12 ENCOUNTER — Telehealth: Payer: Self-pay | Admitting: Pulmonary Disease

## 2021-12-12 MED ORDER — RIVAROXABAN 20 MG PO TABS: 20.0000 mg | ORAL_TABLET | Freq: Every day | ORAL | 2 refills | Status: DC

## 2021-12-12 NOTE — Telephone Encounter (Signed)
Called and spoke with the pt  I have made him aware of response per Dr Lamonte Sakai  He verbalized understanding  He wants to discuss med change with his spouse before we change anything  He is going to call us back after he speaks with her

## 2021-12-12 NOTE — Telephone Encounter (Signed)
Spoke with the pt  He is c/o itching all over, just started last night- back, legs, chest Denies any rash He says past 2 days he has "felt feverish" but no fever- has been having sweats and chills off and on  Denies any respiratory co's  He started on Eliquis a month ago  He wonders if this could be a side effect  The only other new med he started was atrovent NS- he stopped this already and still has these symptoms  Please advise, thanks!

## 2021-12-12 NOTE — Telephone Encounter (Signed)
Pt returned call and said he was okay with [place] sending Rx for Xarelto to the pharmacy for him. Rx has been sent. Nothing further needed.

## 2021-12-12 NOTE — Telephone Encounter (Signed)
It is possible that this could be related to the eliquis. Best plan to determine would be to stop the eliquis. If we do so we need to get him on alternative > would start xarelto '20mg'$  qd. Needs to call us to let us know sx. Prob need to be seen by JD or APP soon to trouble shoot this.

## 2021-12-13 ENCOUNTER — Telehealth: Payer: Self-pay | Admitting: Internal Medicine

## 2021-12-13 NOTE — Telephone Encounter (Signed)
   Call from Leana Roe wife -patient developed a rash that is presumed due to Eliquis for pulm embolism for which she was discharged on November 10, 2021.  Incident of [place] [facility] switch him to Xarelto but 12/13/2021  wife says rash is worse. Says he gets drug rash and has neeed 10d steroids. FEels he needs steroids again. We discussed various pred regimens - they prefer 10 day regimen  A Drug Rash (? Eliquis)  Plan - Take prednisone 40 mg daily x 2 days, then prednisone '30mg'$  daily x 2 days, then '20mg'$  daily x 2 days, then '10mg'$  daily x 2 days, then '5mg'$  daily x 2 days and stop  - called into [facility] [phone] 12:24 PM 12/13/2021      SIGNATURE    Dr. Brand Males, M.D., F.C.C.P,  Pulmonary and Critical Care Medicine Staff Physician, [place] Director - Interstitial Lung Disease  Program  Medical Director - [facility] [place] [facility] [place], [place], [address]   Pager: [phone], If no answer  -[place] or [place] [phone] Telephone (clinical office): [phone] Telephone (research): [phone]  12:25 PM 12/13/2021     Allergies  Allergen Reactions   Methotrexate Derivatives Other (See Comments)    Mouth ulcers   Cardura [Doxazosin] Other (See Comments)    Aggravated prostate Oliguria Bladder pain and irritation Urinary hesitancy   Lipitor [Atorvastatin] Rash   Penicillins Rash    DID THE REACTION INVOLVE: Swelling of the face/tongue/throat, SOB, or low BP? Y Sudden or severe rash/hives, skin peeling, or the inside of the mouth or nose? Y Did it require medical treatment? N When did it last happen?  1970 If all above answers are "NO", may proceed with cephalosporin use.    Zestril [Lisinopril] Other (See Comments)    Depression Fatigue   Bystolic [Nebivolol Hcl] Rash   Zocor [Simvastatin] Rash

## 2022-01-13 ENCOUNTER — Telehealth: Payer: Self-pay | Admitting: Pulmonary Disease

## 2022-01-13 NOTE — Telephone Encounter (Signed)
I think ok to continue xarelto. If worsens contact us and will need OV to evaluate.   Only other option for blood thinner would be warfarin which will require establishing care in a Warfarin clinic.

## 2022-01-13 NOTE — Telephone Encounter (Signed)
Spoke with pt who states he is having increased swelling in lower right leg with slight swelling in other leg. Pt believes swelling in hands is normal r/t arthritis. Pt has no c/o resp symptoms. Pt did state he was switched from Eliquis to [place] 1 month ago because he though he had a reaction to Eliquis. Dr. Silas Flood please advise as Dr. Erin Fulling is unavailable. Thank you

## 2022-01-13 NOTE — Telephone Encounter (Signed)
Spoke with pt and reviewed Dr. Kavin Leech advise. Pt stated understanding. Nothing further needed at this time.

## 2022-01-26 ENCOUNTER — Telehealth: Payer: Self-pay | Admitting: Pulmonary Disease

## 2022-01-26 NOTE — Telephone Encounter (Signed)
Called patient but he did not answer. Left message for him to call us back tomorrow.  °

## 2022-01-26 NOTE — Telephone Encounter (Signed)
Spoke to pt and he states having a lot of swelling in right leg and in hands on Rivaroxaban (Xarelto) 20 MG but not helping with swelling. Left leg has a little swelling as well. Message sent to DOD to advise.

## 2022-01-26 NOTE — Telephone Encounter (Signed)
Xarelto does not usually cause leg swelling or swelling  Not sure if there is any other process ongoing  May need to be evaluated with ultrasound of his extremity, still needs to be on a blood thinner-Eliquis not tolerated from previous documentation  Can schedule him for an urgent visit with one of the APP's

## 2022-02-06 ENCOUNTER — Ambulatory Visit (INDEPENDENT_AMBULATORY_CARE_PROVIDER_SITE_OTHER): Payer: 59 | Admitting: Pulmonary Disease

## 2022-02-06 ENCOUNTER — Encounter: Payer: Self-pay | Admitting: Pulmonary Disease

## 2022-02-06 VITALS — BP 122/78 | HR 63 | Temp 97.8°F | Ht 72.0 in | Wt 247.6 lb

## 2022-02-06 DIAGNOSIS — E6609 Other obesity due to excess calories: Secondary | ICD-10-CM

## 2022-02-06 DIAGNOSIS — R0602 Shortness of breath: Secondary | ICD-10-CM

## 2022-02-06 DIAGNOSIS — Z6833 Body mass index (BMI) 33.0-33.9, adult: Secondary | ICD-10-CM

## 2022-02-06 DIAGNOSIS — I2699 Other pulmonary embolism without acute cor pulmonale: Secondary | ICD-10-CM

## 2022-02-06 DIAGNOSIS — I82411 Acute embolism and thrombosis of right femoral vein: Secondary | ICD-10-CM | POA: Diagnosis not present

## 2022-02-06 NOTE — Patient Instructions (Signed)
Continue xarelto daily  We will refer you to vascular surgery for further evaluation of your right leg  We will refer you to weight management clinic  Follow up in April 2024 with pulmonary function tests
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
## 2022-02-18 ENCOUNTER — Encounter: Payer: Self-pay | Admitting: Pulmonary Disease

## 2022-02-18 MED ORDER — RIVAROXABAN 20 MG PO TABS: 20.0000 mg | ORAL_TABLET | Freq: Every day | ORAL | 3 refills | Status: DC

## 2022-02-18 NOTE — Telephone Encounter (Signed)
Mychart message was sent by pt. Info pt needs to know about: Willie Dominguez "[name]"  P [facility] (supporting Freddi Starr, MD)1 hour ago (2:16 PM)    Dr. Erin Fulling,   *I have not been contacted about any referrals and next appointment: 1. Vascular Specialist/Surgeons - Figured this is pretty important,  when should i be contacted, it has been almost two weeks now? 2. Have not been contacted for medical weight loss referral.    Routing to [facility] about the referrals that pt has not heard about.

## 2022-02-19 NOTE — Telephone Encounter (Signed)
Both referrals were sent out on 12/8 - I called [facility] and spoke to [facility].  She states referral needs to be processed.  She will send it to process and then will try to call pt today.  Their phone # is [phone] if pt wants to call them for status.  I called [facility] & spoke to [place].  He states he will try to contact pt today and get him scheduled for the information session.  Their phone # is [phone] if pt needs to call them.  Will route to triage so pt can be made aware thru [place].

## 2022-02-20 ENCOUNTER — Other Ambulatory Visit: Payer: Self-pay | Admitting: *Deleted

## 2022-02-20 DIAGNOSIS — R600 Localized edema: Secondary | ICD-10-CM

## 2022-02-24 ENCOUNTER — Ambulatory Visit (HOSPITAL_COMMUNITY)
Admission: RE | Admit: 2022-02-24 | Discharge: 2022-02-24 | Disposition: A | Discharge status: Home / Self Care | Payer: 59 | Source: Ambulatory Visit | Attending: Vascular Surgery | Admitting: Vascular Surgery

## 2022-02-24 DIAGNOSIS — R600 Localized edema: Secondary | ICD-10-CM | POA: Diagnosis present

## 2022-02-25 ENCOUNTER — Ambulatory Visit (INDEPENDENT_AMBULATORY_CARE_PROVIDER_SITE_OTHER): Payer: 59 | Admitting: Physician Assistant

## 2022-02-25 VITALS — BP 118/72 | HR 62 | Temp 98.0°F | Ht 72.0 in | Wt 243.0 lb

## 2022-02-25 DIAGNOSIS — R600 Localized edema: Secondary | ICD-10-CM

## 2022-02-25 DIAGNOSIS — I82511 Chronic embolism and thrombosis of right femoral vein: Secondary | ICD-10-CM

## 2022-02-25 DIAGNOSIS — M7989 Other specified soft tissue disorders: Secondary | ICD-10-CM
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
## 2022-03-06 ENCOUNTER — Telehealth: Payer: Self-pay | Admitting: Hematology and Oncology

## 2022-03-06 NOTE — Telephone Encounter (Signed)
Contacted patient to scheduled appointments. Patient is aware of appointments that are scheduled.   

## 2022-03-07 ENCOUNTER — Inpatient Hospital Stay: Payer: 59

## 2022-03-07 ENCOUNTER — Inpatient Hospital Stay: Payer: 59 | Admitting: Hematology and Oncology

## 2022-03-09 ENCOUNTER — Inpatient Hospital Stay: Payer: 59 | Attending: Hematology and Oncology | Admitting: Hematology and Oncology

## 2022-03-09 ENCOUNTER — Inpatient Hospital Stay: Payer: 59

## 2022-03-09 ENCOUNTER — Encounter: Payer: Self-pay | Admitting: Hematology and Oncology

## 2022-03-09 VITALS — BP 137/84 | HR 62 | Temp 98.8°F | Resp 18 | Ht 72.0 in | Wt 247.8 lb

## 2022-03-09 DIAGNOSIS — E669 Obesity, unspecified: Secondary | ICD-10-CM

## 2022-03-09 DIAGNOSIS — Z6833 Body mass index (BMI) 33.0-33.9, adult: Secondary | ICD-10-CM

## 2022-03-09 DIAGNOSIS — N400 Enlarged prostate without lower urinary tract symptoms: Secondary | ICD-10-CM

## 2022-03-09 DIAGNOSIS — I2699 Other pulmonary embolism without acute cor pulmonale: Secondary | ICD-10-CM | POA: Insufficient documentation

## 2022-03-09 DIAGNOSIS — G609 Hereditary and idiopathic neuropathy, unspecified: Secondary | ICD-10-CM

## 2022-03-09 DIAGNOSIS — R202 Paresthesia of skin: Secondary | ICD-10-CM | POA: Diagnosis not present

## 2022-03-09 DIAGNOSIS — Z79899 Other long term (current) drug therapy: Secondary | ICD-10-CM | POA: Insufficient documentation

## 2022-03-09 DIAGNOSIS — Z7901 Long term (current) use of anticoagulants: Secondary | ICD-10-CM | POA: Insufficient documentation

## 2022-03-09 DIAGNOSIS — D472 Monoclonal gammopathy: Secondary | ICD-10-CM | POA: Diagnosis not present

## 2022-03-09 LAB — CMP (CANCER CENTER ONLY)
ALT: 21 U/L (ref 0–44)
AST: 22 U/L (ref 15–41)
Albumin: 4.4 g/dL (ref 3.5–5.0)
Alkaline Phosphatase: 90 U/L (ref 38–126)
Anion gap: 6 (ref 5–15)
BUN: 12 mg/dL (ref 8–23)
CO2: 29 mmol/L (ref 22–32)
Calcium: 9.7 mg/dL (ref 8.9–10.3)
Chloride: 104 mmol/L (ref 98–111)
Creatinine: 1.11 mg/dL (ref 0.61–1.24)
GFR, Estimated: >60 mL/min (ref >60)
Glucose, Bld: 110 mg/dL — ABNORMAL HIGH (ref 70–99)
Potassium: 4 mmol/L (ref 3.5–5.1)
Sodium: 139 mmol/L (ref 135–145)
Total Bilirubin: 0.6 mg/dL (ref 0.3–1.2)
Total Protein: 6.7 g/dL (ref 6.5–8.1)

## 2022-03-09 LAB — CBC WITH DIFFERENTIAL (CANCER CENTER ONLY)
Abs Immature Granulocytes: 0.02 10*3/uL (ref 0.00–0.07)
Basophils Absolute: 0 10*3/uL (ref 0.0–0.1)
Basophils Relative: 0 %
Eosinophils Absolute: 0.1 10*3/uL (ref 0.0–0.5)
Eosinophils Relative: 2 %
HCT: 40.5 % (ref 39.0–52.0)
Hemoglobin: 14 g/dL (ref 13.0–17.0)
Immature Granulocytes: 0 %
Lymphocytes Relative: 25 %
Lymphs Abs: 1.1 10*3/uL (ref 0.7–4.0)
MCH: 31.6 pg (ref 26.0–34.0)
MCHC: 34.6 g/dL (ref 30.0–36.0)
MCV: 91.4 fL (ref 80.0–100.0)
Monocytes Absolute: 0.4 10*3/uL (ref 0.1–1.0)
Monocytes Relative: 9 %
Neutro Abs: 2.9 10*3/uL (ref 1.7–7.7)
Neutrophils Relative %: 64 %
Platelet Count: 119 10*3/uL — ABNORMAL LOW (ref 150–400)
RBC: 4.43 MIL/uL (ref 4.22–5.81)
RDW: 13.7 % (ref 11.5–15.5)
WBC Count: 4.5 10*3/uL (ref 4.0–10.5)
nRBC: 0 % (ref 0.0–0.2)

## 2022-03-09 NOTE — Assessment & Plan Note (Signed)
I reviewed with the patient about the plan for care for recurrent DVT/PE.  This last episode of blood clot appeared to be unprovoked, but I suspect it could be caused by his sedentary lifestyle as well as poor oral fluid intake/dehydration. We discussed about the pros and cons about testing for thrombophilia disorder. his current anticoagulation therapy will interfere with some the tests and it is not possible to interpret the test results.  Taking him off the anticoagulation therapy to do the tests may precipitate another thrombotic event. I do not see a reason to order excessive testing to screen for thrombophilia disorder as it would not change our management.  The goal of anticoagulation therapy is for life.   We discussed about various options of anticoagulation therapies including warfarin, low molecular weight heparin such as Lovenox or newer agents such as Rivaroxaban. Some of the risks and benefits discussed including costs involved, the need for monitoring, risks of life-threatening bleeding/hospitalization, reversibility of each agent in the event of bleeding or overdose, safety profile of each drug and taking into account other social issues such as ease of administration of medications, etc. Ultimately, we have made an informed decision for the patient to continue his treatment with Xarelto  I recommend the patient to use elastic compression stockings at 20-30 mmHg to reduce risks of chronic thrombophlebitis.  Finally, at the end of our consultation today, I reinforced the importance of preventive strategies such as avoiding hormonal supplement, avoiding cigarette smoking, keeping up-to-date with screening programs for early cancer detection, frequent ambulation for long distance travel and aggressive DVT prophylaxis in all surgical settings.  I will order PSA to rule out prostate cancer I will also order blood work to screen for MGUS as a cause of his neuropathy I will call him with test  results He does not need yearly follow-up I will see him back in the future if he need perioperative anticoagulation management
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
## 2022-03-09 NOTE — Assessment & Plan Note (Signed)
He has significant chronic back pain and leads a sedentary lifestyle due to chronic pain We discussed importance of weight loss and I recommend the patient to seek help to see orthopedic surgeon to see if anything that could be done for his back pain I also recommend the patient to hydrate and take at least 100 ounces of liquid per day

## 2022-03-09 NOTE — Assessment & Plan Note (Signed)
The cause of his neuropathy is unknown I will order a screening test to look for MGUS

## 2022-03-10 LAB — PSA, TOTAL AND FREE
PSA, Free Pct: 46.7 %
PSA, Free: 0.14 ng/mL
Prostate Specific Ag, Serum: 0.3 ng/mL (ref 0.0–4.0)

## 2022-03-10 LAB — KAPPA/LAMBDA LIGHT CHAINS
Kappa free light chain: 19.3 mg/L (ref 3.3–19.4)
Kappa, lambda light chain ratio: 1.6 (ref 0.26–1.65)
Lambda free light chains: 12.1 mg/L (ref 5.7–26.3)

## 2022-03-11 ENCOUNTER — Ambulatory Visit (INDEPENDENT_AMBULATORY_CARE_PROVIDER_SITE_OTHER): Payer: 59 | Admitting: Family Medicine

## 2022-03-11 ENCOUNTER — Encounter (INDEPENDENT_AMBULATORY_CARE_PROVIDER_SITE_OTHER): Payer: Self-pay

## 2022-03-11 ENCOUNTER — Encounter (INDEPENDENT_AMBULATORY_CARE_PROVIDER_SITE_OTHER): Payer: Self-pay | Admitting: Family Medicine

## 2022-03-11 VITALS — BP 132/75 | HR 61 | Temp 98.4°F | Ht 71.0 in | Wt 241.0 lb

## 2022-03-11 DIAGNOSIS — R0609 Other forms of dyspnea: Secondary | ICD-10-CM

## 2022-03-11 DIAGNOSIS — I8290 Acute embolism and thrombosis of unspecified vein: Secondary | ICD-10-CM | POA: Diagnosis not present

## 2022-03-11 DIAGNOSIS — Z86711 Personal history of pulmonary embolism: Secondary | ICD-10-CM

## 2022-03-11 DIAGNOSIS — Z0289 Encounter for other administrative examinations: Secondary | ICD-10-CM

## 2022-03-11 DIAGNOSIS — R0600 Dyspnea, unspecified: Secondary | ICD-10-CM | POA: Insufficient documentation

## 2022-03-11 DIAGNOSIS — E668 Other obesity: Secondary | ICD-10-CM | POA: Diagnosis not present

## 2022-03-11 DIAGNOSIS — E669 Obesity, unspecified: Secondary | ICD-10-CM

## 2022-03-11 DIAGNOSIS — Z6833 Body mass index (BMI) 33.0-33.9, adult: Secondary | ICD-10-CM

## 2022-03-11 DIAGNOSIS — E65 Localized adiposity: Secondary | ICD-10-CM

## 2022-03-12 LAB — MULTIPLE MYELOMA PANEL, SERUM
Albumin SerPl Elph-Mcnc: 4.1 g/dL (ref 2.9–4.4)
Albumin/Glob SerPl: 1.6 (ref 0.7–1.7)
Alpha 1: 0.2 g/dL (ref 0.0–0.4)
Alpha2 Glob SerPl Elph-Mcnc: 0.8 g/dL (ref 0.4–1.0)
B-Globulin SerPl Elph-Mcnc: 0.9 g/dL (ref 0.7–1.3)
Gamma Glob SerPl Elph-Mcnc: 0.7 g/dL (ref 0.4–1.8)
Globulin, Total: 2.6 g/dL (ref 2.2–3.9)
IgA: 133 mg/dL (ref 61–437)
IgG (Immunoglobin G), Serum: 785 mg/dL (ref 603–1613)
IgM (Immunoglobulin M), Srm: 127 mg/dL (ref 15–143)
Total Protein ELP: 6.7 g/dL (ref 6.0–8.5)

## 2022-03-16 ENCOUNTER — Other Ambulatory Visit: Payer: Self-pay | Admitting: Emergency Medicine

## 2022-03-17 ENCOUNTER — Telehealth: Payer: Self-pay | Admitting: Hematology and Oncology

## 2022-03-17 NOTE — Telephone Encounter (Signed)
I called the patient I reviewed all test results with him I addressed all his questions He does not need long-term follow-up with me
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
## 2022-04-15 ENCOUNTER — Encounter (INDEPENDENT_AMBULATORY_CARE_PROVIDER_SITE_OTHER): Payer: Self-pay

## 2022-04-15 ENCOUNTER — Ambulatory Visit (INDEPENDENT_AMBULATORY_CARE_PROVIDER_SITE_OTHER): Payer: 59 | Admitting: Family Medicine

## 2022-04-29 ENCOUNTER — Ambulatory Visit (INDEPENDENT_AMBULATORY_CARE_PROVIDER_SITE_OTHER): Payer: 59 | Admitting: Family Medicine

## 2022-05-14 ENCOUNTER — Other Ambulatory Visit: Payer: Self-pay | Admitting: Cardiology

## 2022-05-27 ENCOUNTER — Ambulatory Visit: Payer: 59

## 2022-05-27 DIAGNOSIS — R0609 Other forms of dyspnea: Secondary | ICD-10-CM

## 2022-05-31 NOTE — Progress Notes (Unsigned)
Patient referred by [name], [name], MD for exertional chest pain and dyspnea  Subjective:   Willie Dominguez, male    DOB: 06-05-1950, 72 y.o.   MRN: [id number]   No chief complaint on file.   HPI  72 year old Caucasian male with hypertension, hyperlipidemia, CAD, recurrent DVT/PE, family h/o CAD, mild TAA  Patient recently had worsening dyspnea on right leg edema symptoms, that prompted ER visit.  Work-up showed DVT in the right femoral-popliteal vein, as well as acute PE with right heart strain.  He was hemodynamically stable.  He was treated with heparin, transition to Eliquis.  Given his bradycardia, and possible chronotropic incompetence for monitoring, it was discontinued.  He continues to have exertional dyspnea.  Heart rate seems to have increased after stopping atenolol.  Blood pressure is well controlled.  In the past, patient has undergone PCI's to his obstructive CAD, without any improvement in his dyspnea symptoms.  Therefore, it was deemed that his dyspnea is unrelated to his CAD.   Current Outpatient Medications:    XARELTO 20 MG TABS tablet, TAKE 1 TABLET BY MOUTH DAILY WITH SUPPER., Disp: 30 tablet, Rfl: 2   acetaminophen (TYLENOL) 500 MG tablet, Take 1,500 mg by mouth daily as needed for fever, headache or moderate pain., Disp: , Rfl:    Cholecalciferol (VITAMIN D-3 PO), Take 1 capsule by mouth daily., Disp: , Rfl:    Coenzyme Q10 (COQ10 PO), Take 1 capsule by mouth daily., Disp: , Rfl:    Cyanocobalamin (VITAMIN B-12 PO), Take 1 tablet by mouth daily., Disp: , Rfl:    furosemide (LASIX) 20 MG tablet, Take 1 tablet (20 mg total) by mouth daily., Disp: 90 tablet, Rfl: 3   montelukast (SINGULAIR) 10 MG tablet, Take 1 tablet (10 mg total) by mouth at bedtime., Disp: 30 tablet, Rfl: 11   nitroGLYCERIN (NITROSTAT) 0.4 MG SL tablet, Place 1 tablet (0.4 mg total) under the tongue every 5 (five) minutes as needed for chest pain., Disp: 30 tablet, Rfl: 3   Omega-3 Fatty  Acids (FISH OIL OMEGA-3 PO), Take 1 capsule by mouth daily., Disp: , Rfl:    rosuvastatin (CRESTOR) 20 MG tablet, TAKE 1 TABLET BY MOUTH EVERY DAY, Disp: 30 tablet, Rfl: 9   valsartan (DIOVAN) 80 MG tablet, Take 1 tablet (80 mg total) by mouth daily., Disp: 90 tablet, Rfl: 3  Cardiovascular studies:  Echocardiogram 05/27/2022:  Normal LV systolic function with visual EF 55-60%. Left ventricle cavity is normal in size. Normal left ventricular wall thickness. Normal global wall motion. Indeterminate diastolic filling pattern. Calculated EF 56%. Left atrial cavity is mildly dilated. Trileaflet aortic valve.  Trace aortic regurgitation. Mild aortic valve leaflet thickening. Structurally normal mitral valve.  Mild (Grade I) mitral regurgitation. Structurally normal tricuspid valve with trace regurgitation. No evidence of pulmonary hypertension. The aortic root is mildly dilated at 3.6 cm. Mildly dilated ascending aorta at 4.2 cm. IVC is dilated with respiratory variation. No significant change compared to 07/2021.    CTA chest 11/12/2021: Positive for acute PE with CT evidence of right heart strain (RV/LV Ratio = 1.35) consistent with at least submassive (intermediate risk) PE. The presence of right heart strain has been associated with an increased risk of morbidity and mortality. Please refer to the "Code PE Focused" order set in EPIC.   4.2 cm ascending thoracic aortic aneurysm. Recommend annual imaging followup by CT. This recommendation follows 2010 ACCF/AHA/AATS/ACR/ASA/SCA/SCAI/SIR/STS/SVM Guidelines for the Diagnosis and Management of Patients with Thoracic Aortic  Disease. Circulation. 2010; [id number]. Aortic aneurysm NOS (ICD10-I71.9)   Critical Value/emergent results were called by telephone at the time of interpretation on 11/07/2021 at 6:34 pm to provider [facility] , who verbally acknowledged these results.  CPEX 09/25/2021: Mild to moderate functional limitation which  is multifactorial. At peak exercise, patient is primarily limited by his obesity and related restrictive lung physiology. However the VE/VCO2 slope is also significantly elevated and suggests a circulatory limitation with elevated filling pressures during exercise. There is also mild chronotropic incompetence. If symptoms do not improve with weight loss can consider repeat exercise testing with measurement of invasive hemodynamics.    Coronary intervention 10/03/2019: LM: Normal LAD: Patent prox LAD stent.        Mid 40% diffuse disease.        Post PCI dFR in 04/2019 was 0.95 RI: Prox 80% disease. Small caliber vessel. LCx: No significant stenosis. RCA: RPDA main branch with 90% stenosis. Another septal branch with diffuse 70% disease in small caliber vessel.        Successful cutting balloon PTCA and stent placement 2.25 X 18 mm Resolute Onyx drug-eluting stent  LHC/RHC/coronary intervention 04/18/2019: LM: Normal LAD: Prox-mid 80% stenosis and LAD/D1 bifurcation (1,0,0). Moderate disease in prox Diag.         LAD dFR 0.87        Successful percutaneous coronary intervention prox LAD        PTCA and stent placement 2.75 X 12 mm Resolute Onyx drug-eluting stent        Post dilatation with 3.0X8 mm [place] balloon           Post PCI LAD dFR 0.95 RI: Prox 80% disease. Small caliber vessel. LCx: No significant stenosis. RCA: Distal 40%, PDA bifurcation 90% stenosis in small caliber vessel.   Right heart cath: RA: 5 mmHg RV: 27/6 mmHg PA: 26/14 mmHg, mean PA 19 mmHg PCW: 15 mmHg   If residual dyspnea symptoms, will stage PDA bifurcation stenosis stenting in future.  Recent labs: 11/17/2021: Glucose 95, BUN/Cr 20/1.23. EGFR 59. Na/K 137/4.4.  H/H 13/38. MCV 89. Platelets 163 HbA1C 5.6%  08/14/2021: Chol 116, TG 111, HDL 46, LDL 50     Review of Systems  Review of Systems  Cardiovascular:  Positive for dyspnea on exertion. Negative for chest pain, leg swelling, palpitations and syncope.   Respiratory:  Positive for cough and shortness of breath.           There were no vitals filed for this visit.    There is no height or weight on file to calculate BMI. There were no vitals filed for this visit.    Objective:  Physical Exam Vitals and nursing note reviewed.  Constitutional:      General: He is not in acute distress. Neck:     Vascular: No JVD.  Cardiovascular:     Rate and Rhythm: Normal rate and regular rhythm.     Heart sounds: Normal heart sounds. No murmur heard. Pulmonary:     Effort: Pulmonary effort is normal.     Breath sounds: Normal breath sounds. No wheezing or rales.  Musculoskeletal:     Right lower leg: Edema (Trace) present.     Left lower leg: Edema (Trace) present.        Assessment & Recommendations:   72 year old Caucasian male with hypertension, hyperlipidemia, history of bilateral lower extremity DVT and PE (01/2018), CAD, persistent dyspnea and new cough.  Exertional dyspnea: Multifactorial, including recurrent DVT/PE.  Continues to have exercises induced cough.  I will defer to pulmonology to discuss if this could be exercise-induced asthma. As previously mentioned, it is unlikely that his residual dyspnea is related to his CAD, as it never improved after successful PCI to obstructive CAD stenoses. Given his recent PE, we will repeat echocardiogram in 05/2022 to look for improvement in RV strain seen on CT angiogram in 10/2021.  CAD: Not on statin due ongoing use of eliquis. Continue statin.  Thoracic aorta aneurysm: 4.4 cm. We will repeat CT angiogram chest and 10/2022.  Will order at next visit.    Hypertension:  Well-controlled.   Refilling valsartan and Lasix.  F/u in 06/2022   Nigel Mormon, MD Pager: [phone] Office: [phone]

## 2022-06-03 ENCOUNTER — Ambulatory Visit: Payer: 59 | Admitting: Cardiology

## 2022-06-03 ENCOUNTER — Encounter: Payer: Self-pay | Admitting: Cardiology

## 2022-06-03 VITALS — BP 134/76 | HR 60 | Ht 71.0 in | Wt 240.0 lb

## 2022-06-03 DIAGNOSIS — I25118 Atherosclerotic heart disease of native coronary artery with other forms of angina pectoris: Secondary | ICD-10-CM

## 2022-06-03 DIAGNOSIS — I7121 Aneurysm of the ascending aorta, without rupture: Secondary | ICD-10-CM

## 2022-06-13 ENCOUNTER — Other Ambulatory Visit: Payer: Self-pay | Admitting: Pulmonary Disease

## 2022-07-03 ENCOUNTER — Ambulatory Visit: Payer: 59 | Admitting: Family Medicine

## 2022-07-23 ENCOUNTER — Ambulatory Visit: Payer: 59 | Admitting: Pulmonary Disease

## 2022-07-23 ENCOUNTER — Encounter: Payer: Self-pay | Admitting: Pulmonary Disease

## 2022-07-23 ENCOUNTER — Ambulatory Visit (INDEPENDENT_AMBULATORY_CARE_PROVIDER_SITE_OTHER): Payer: 59 | Admitting: Pulmonary Disease

## 2022-07-23 VITALS — BP 116/68 | HR 60 | Temp 98.2°F | Ht 72.0 in | Wt 237.0 lb

## 2022-07-23 DIAGNOSIS — I82511 Chronic embolism and thrombosis of right femoral vein: Secondary | ICD-10-CM

## 2022-07-23 DIAGNOSIS — R0602 Shortness of breath: Secondary | ICD-10-CM

## 2022-07-23 DIAGNOSIS — I2699 Other pulmonary embolism without acute cor pulmonale: Secondary | ICD-10-CM | POA: Diagnosis not present

## 2022-07-23 DIAGNOSIS — R942 Abnormal results of pulmonary function studies: Secondary | ICD-10-CM

## 2022-07-23 LAB — PULMONARY FUNCTION TEST
DL/VA % pred: 106 %
DL/VA: 4.26 ml/min/mmHg/L
DLCO cor % pred: 81 %
DLCO cor: 21.85 ml/min/mmHg
DLCO unc % pred: 81 %
DLCO unc: 21.85 ml/min/mmHg
FEF 25-75 Post: 2.9 L/sec
FEF 25-75 Pre: 2.87 L/sec
FEF2575-%Change-Post: 0 %
FEF2575-%Pred-Post: 112 %
FEF2575-%Pred-Pre: 111 %
FEV1-%Change-Post: -1 %
FEV1-%Pred-Post: 79 %
FEV1-%Pred-Pre: 80 %
FEV1-Post: 2.72 L
FEV1-Pre: 2.75 L
FEV1FVC-%Change-Post: -1 %
FEV1FVC-%Pred-Pre: 110 %
FEV6-%Change-Post: 0 %
FEV6-%Pred-Post: 77 %
FEV6-%Pred-Pre: 77 %
FEV6-Post: 3.38 L
FEV6-Pre: 3.38 L
FEV6FVC-%Change-Post: 0 %
FEV6FVC-%Pred-Post: 105 %
FEV6FVC-%Pred-Pre: 105 %
FVC-%Change-Post: 0 %
FVC-%Pred-Post: 72 %
FVC-%Pred-Pre: 72 %
FVC-Post: 3.38 L
FVC-Pre: 3.38 L
Post FEV1/FVC ratio: 80 %
Post FEV6/FVC ratio: 100 %
Pre FEV1/FVC ratio: 81 %
Pre FEV6/FVC Ratio: 100 %
RV % pred: 74 %
RV: 1.89 L
TLC % pred: 70 %
TLC: 5.16 L

## 2022-07-23 NOTE — Progress Notes (Signed)
Full PFT completed today ? ?

## 2022-07-23 NOTE — Patient Instructions (Signed)
Your breathing tests show mild restrictive defect. This may be due to decreased performance on the breathing test.  I am reassured that your shortness of breath is improved with weight loss and increasing your physical activity.  We will check high resolution CT Chest scan in September this year  Follow up after CT Chest scan in September
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
## 2022-07-31 ENCOUNTER — Ambulatory Visit: Payer: 59 | Admitting: Family Medicine

## 2022-07-31 ENCOUNTER — Encounter: Payer: Self-pay | Admitting: Family Medicine

## 2022-07-31 VITALS — BP 110/70 | HR 55 | Temp 97.8°F | Ht 72.0 in | Wt 237.3 lb

## 2022-07-31 DIAGNOSIS — R319 Hematuria, unspecified: Secondary | ICD-10-CM

## 2022-07-31 DIAGNOSIS — R109 Unspecified abdominal pain: Secondary | ICD-10-CM | POA: Diagnosis not present

## 2022-07-31 LAB — POC URINALSYSI DIPSTICK (AUTOMATED)
Bilirubin, UA: NEGATIVE
Glucose, UA: NEGATIVE
Ketones, UA: NEGATIVE
Leukocytes, UA: NEGATIVE
Nitrite, UA: NEGATIVE
Protein, UA: NEGATIVE
Spec Grav, UA: 1.025 (ref 1.010–1.025)
Urobilinogen, UA: 0.2 E.U./dL
pH, UA: 5.5 (ref 5.0–8.0)

## 2022-07-31 NOTE — Progress Notes (Signed)
Acute Office Visit  Subjective:     Patient ID: Willie Dominguez, male    DOB: March 27, 1950, 72 y.o.   MRN: [id number]  Chief Complaint  Patient presents with   Hematuria    X2 months, noticed after any activities such as yard work, denies dysuria    Hematuria Irritative symptoms include urgency. Irritative symptoms do not include frequency. Pertinent negatives include no chills, dysuria, fever, flank pain, nausea, vomiting or weight loss.   Patient is in today for blood in the urine after heavy activity. States that it happens almost every time he engages in physical activity. No pain, no fever or chills. States they there is a little bit of urgency, sometimes he feels like he is not emptying his bladder completely sometimes. States that after physical activity it will gradually lighten and disappear. We reviewed his UA together and discussed next steps.   Review of Systems  Constitutional:  Negative for chills, fever, malaise/fatigue and weight loss.  Gastrointestinal:  Negative for nausea and vomiting.  Genitourinary:  Positive for hematuria and urgency. Negative for dysuria, flank pain and frequency.        Objective:    BP 110/70 (BP Location: Left Arm, Patient Position: Sitting, Cuff Size: Large)   Pulse (!) 55   Temp 97.8 F (36.6 C) (Oral)   Ht 6' (1.829 m)   Wt 237 lb 4.8 oz (107.6 kg)   SpO2 95%   BMI 32.18 kg/m    Physical Exam Vitals reviewed.  Constitutional:      Appearance: Normal appearance. He is normal weight.  Cardiovascular:     Rate and Rhythm: Normal rate and regular rhythm.     Heart sounds: Normal heart sounds. No murmur heard. Pulmonary:     Effort: Pulmonary effort is normal.     Breath sounds: Normal breath sounds.  Abdominal:     General: Abdomen is flat. Bowel sounds are normal.     Palpations: Abdomen is soft.     Tenderness: There is no right CVA tenderness or left CVA tenderness.  Neurological:     Mental Status: He is alert.      Results for orders placed or performed in visit on 07/31/22  POCT Urinalysis Dipstick (Automated)  Result Value Ref Range   Color, UA yellow    Clarity, UA clear    Glucose, UA Negative Negative   Bilirubin, UA negative    Ketones, UA negative    Spec Grav, UA 1.025 1.010 - 1.025   Blood, UA small    pH, UA 5.5 5.0 - 8.0   Protein, UA Negative Negative   Urobilinogen, UA 0.2 0.2 or 1.0 E.U./dL   Nitrite, UA negative    Leukocytes, UA Negative Negative        Assessment & Plan:   Problem List Items Addressed This Visit       Unprioritized   Painless hematuria   Other Visit Diagnoses     Hematuria, unspecified type    -  Primary   Relevant Orders   POCT Urinalysis Dipstick (Automated) (Completed)   Ambulatory referral to Urology   Abdominal pain, unspecified abdominal location       Relevant Orders   CT RENAL STONE STUDY     UA shows small blood, the hematuria is painless, however pt does have a history of kidney stones in the past. I will order a CT renal study but most likely the patient will need to be seen by urology and  have a cystoscopy to rule out neoplasm. No infection identified today as the cause. RTC if sx worsen or persist.   No orders of the defined types were placed in this encounter.   No follow-ups on file.  Karie Georges, MD

## 2022-08-05 ENCOUNTER — Ambulatory Visit
Admission: RE | Admit: 2022-08-05 | Discharge: 2022-08-05 | Disposition: A | Discharge status: Home / Self Care | Payer: 59 | Source: Ambulatory Visit | Attending: Family Medicine | Admitting: Family Medicine

## 2022-08-05 DIAGNOSIS — R109 Unspecified abdominal pain: Secondary | ICD-10-CM

## 2022-08-19 ENCOUNTER — Other Ambulatory Visit: Payer: Self-pay | Admitting: Pulmonary Disease

## 2022-08-19 DIAGNOSIS — R059 Cough, unspecified: Secondary | ICD-10-CM

## 2022-10-07 ENCOUNTER — Other Ambulatory Visit: Payer: Self-pay | Admitting: Cardiology

## 2022-10-30 ENCOUNTER — Telehealth: Payer: Self-pay | Admitting: Gastroenterology

## 2022-10-30 NOTE — Telephone Encounter (Signed)
Patients wife called to schedule hospital recall.

## 2022-11-05 ENCOUNTER — Other Ambulatory Visit: Payer: Self-pay

## 2022-11-05 DIAGNOSIS — K635 Polyp of colon: Secondary | ICD-10-CM

## 2022-11-05 MED ORDER — NA SULFATE-K SULFATE-MG SULF 17.5-3.13-1.6 GM/177ML PO SOLN: 1.0000 | Freq: Once | ORAL | 0 refills | Status: AC

## 2022-11-05 NOTE — Telephone Encounter (Signed)
Colon has been scheduled for 12/31/22 at 930 am at [facility] with GM   Left message on machine to call back

## 2022-11-06 ENCOUNTER — Other Ambulatory Visit: Payer: Self-pay | Admitting: Pulmonary Disease

## 2022-11-06 NOTE — Telephone Encounter (Signed)
colon scheduled, pt instructed and medications reviewed.  Patient instructions mailed to home.  Patient to call with any questions or concerns.

## 2022-11-17 ENCOUNTER — Ambulatory Visit (HOSPITAL_COMMUNITY)
Admission: RE | Admit: 2022-11-17 | Discharge: 2022-11-17 | Disposition: A | Discharge status: Home / Self Care | Payer: 59 | Source: Ambulatory Visit | Attending: Pulmonary Disease | Admitting: Pulmonary Disease

## 2022-11-17 DIAGNOSIS — R0602 Shortness of breath: Secondary | ICD-10-CM | POA: Insufficient documentation

## 2022-11-17 DIAGNOSIS — R942 Abnormal results of pulmonary function studies: Secondary | ICD-10-CM | POA: Insufficient documentation

## 2022-12-24 ENCOUNTER — Encounter (HOSPITAL_COMMUNITY): Payer: Self-pay | Admitting: Gastroenterology

## 2022-12-24 ENCOUNTER — Other Ambulatory Visit: Payer: Self-pay

## 2022-12-24 NOTE — Progress Notes (Signed)
Pre op call eval Name:Willie Dominguez  PCP-Betty Swaziland MD Cardiologist-Dr. Rosemary Holms Pulmonolgist-Dr. Francine Graven   EKG-06/03/22  Echo-05/27/22 [id number] Stress-09/25/21 ICD/PM-n/a Blood thinner-Xarelto - unsure when to hold, office notified GLP-1- n/a  Hx:HTN, CKD, Thrombocytopenia, Aortic atherosclerosis, PE/DVT, Thoracic aorta aneurysm, CAD, thrombocytopenia . Sees cardiology, last office visit 06/03/22, seeing for dyspnea, recommended echo 1 year and CT scan which was done 9/17 , f/u 1 year. Also sees pulmonology, last OFV 07/23/22 also recommend CT which done, has a f/u in december. Patient reports having chronic dyspnea but right now unchanged, feels breathing is good.  Anesthesia Review: Yes

## 2022-12-28 ENCOUNTER — Telehealth: Payer: Self-pay

## 2022-12-28 NOTE — Telephone Encounter (Signed)
-----   Message from Nurse Mauricio Dahlen P sent at 12/24/2022 11:10 AM EDT ----- Make sure the pt has xarelto  hold for 10/31

## 2022-12-28 NOTE — Telephone Encounter (Signed)
Letter resent to Dr Francine Graven and PCP for urgent response to ok the hold of xarelto prior to the 10/31 procedure.

## 2022-12-29 ENCOUNTER — Telehealth: Payer: Self-pay | Admitting: Pulmonary Disease

## 2022-12-29 NOTE — Telephone Encounter (Signed)
Pt calling in bc he has a colonoscopy sched and needs clearance from dr. Francine Graven that he can stop his blood thinners 2 days in advance

## 2022-12-29 NOTE — Telephone Encounter (Signed)
I spoke to the pt and he tells me that he has received a call from Dr Lanora Manis office with an ok to hold xarelto 2 days prior to the upcoming appt.  I have sent a letter and left several messages for Dr Cindi Carbon office to send the ok to our office for our records without success.  The pt states he will also call and have them fax the ok to our office.

## 2022-12-31 ENCOUNTER — Ambulatory Visit (HOSPITAL_COMMUNITY)
Admission: RE | Admit: 2022-12-31 | Discharge: 2022-12-31 | Disposition: A | Discharge status: Home / Self Care | Payer: 59 | Attending: Gastroenterology | Admitting: Gastroenterology

## 2022-12-31 ENCOUNTER — Encounter (HOSPITAL_COMMUNITY): Payer: Self-pay | Admitting: Gastroenterology

## 2022-12-31 ENCOUNTER — Ambulatory Visit (HOSPITAL_COMMUNITY): Payer: 59 | Admitting: Anesthesiology

## 2022-12-31 ENCOUNTER — Encounter (HOSPITAL_COMMUNITY)
Admission: RE | Disposition: A | Discharge status: Home / Self Care | Payer: Self-pay | Source: Home / Self Care | Attending: Gastroenterology

## 2022-12-31 ENCOUNTER — Other Ambulatory Visit: Payer: Self-pay

## 2022-12-31 ENCOUNTER — Ambulatory Visit (HOSPITAL_BASED_OUTPATIENT_CLINIC_OR_DEPARTMENT_OTHER): Payer: 59 | Admitting: Anesthesiology

## 2022-12-31 DIAGNOSIS — Z860101 Personal history of adenomatous and serrated colon polyps: Secondary | ICD-10-CM | POA: Diagnosis present

## 2022-12-31 DIAGNOSIS — Z1211 Encounter for screening for malignant neoplasm of colon: Secondary | ICD-10-CM

## 2022-12-31 DIAGNOSIS — Z9889 Other specified postprocedural states: Secondary | ICD-10-CM

## 2022-12-31 DIAGNOSIS — D120 Benign neoplasm of cecum: Secondary | ICD-10-CM | POA: Diagnosis not present

## 2022-12-31 DIAGNOSIS — Z860100 Personal history of colon polyps, unspecified: Secondary | ICD-10-CM

## 2022-12-31 DIAGNOSIS — M1990 Unspecified osteoarthritis, unspecified site: Secondary | ICD-10-CM | POA: Insufficient documentation

## 2022-12-31 DIAGNOSIS — K5730 Diverticulosis of large intestine without perforation or abscess without bleeding: Secondary | ICD-10-CM

## 2022-12-31 DIAGNOSIS — K635 Polyp of colon: Secondary | ICD-10-CM

## 2022-12-31 DIAGNOSIS — K641 Second degree hemorrhoids: Secondary | ICD-10-CM

## 2022-12-31 DIAGNOSIS — I10 Essential (primary) hypertension: Secondary | ICD-10-CM | POA: Diagnosis not present

## 2022-12-31 HISTORY — PX: COLONOSCOPY WITH PROPOFOL: SHX5780

## 2022-12-31 HISTORY — PX: HOT HEMOSTASIS: SHX5433

## 2022-12-31 HISTORY — PX: HEMOSTASIS CLIP PLACEMENT: SHX6857

## 2022-12-31 HISTORY — PX: ENDOSCOPIC MUCOSAL RESECTION: SHX6839

## 2022-12-31 SURGERY — COLONOSCOPY WITH PROPOFOL
Anesthesia: Monitor Anesthesia Care

## 2022-12-31 MED ORDER — RIVAROXABAN 20 MG PO TABS: 20.0000 mg | ORAL_TABLET | Freq: Every day | ORAL | Status: DC

## 2022-12-31 MED ORDER — PROPOFOL 1000 MG/100ML IV EMUL
INTRAVENOUS | Status: AC
  Filled 2022-12-31: qty 100

## 2022-12-31 MED ORDER — PROPOFOL 10 MG/ML IV BOLUS
INTRAVENOUS | Status: DC | PRN
  Administered 2022-12-31: 10 mg via INTRAVENOUS
  Administered 2022-12-31 (×2): 20 mg via INTRAVENOUS

## 2022-12-31 MED ORDER — SODIUM CHLORIDE 0.9 % IV SOLN
INTRAVENOUS | Status: DC
Start: 2022-12-31 — End: 2022-12-31

## 2022-12-31 MED ORDER — PROPOFOL 500 MG/50ML IV EMUL
INTRAVENOUS | Status: DC | PRN
  Administered 2022-12-31: 130 ug/kg/min via INTRAVENOUS

## 2022-12-31 SURGICAL SUPPLY — 22 items

## 2022-12-31 NOTE — Op Note (Signed)
[facility] Patient Name: Willie Dominguez Procedure Date: 12/31/2022 MRN: [id number] Attending MD: Corliss Parish , MD, [id number] Date of Birth: [birth year] CSN: [id number] Age: 72 Admit Type: Outpatient Procedure:                Colonoscopy Indications:              Surveillance: History of piecemeal removal adenoma                            on last colonoscopy (< 3 yrs) Providers:                Corliss Parish, MD, Norman Clay, RN, Harrington Challenger,                            Technician Referring MD:             Corliss Parish, MD, Kathi Der, MD, Betty                            G. [place] Medicines:                Monitored Anesthesia Care Complications:            No immediate complications. Estimated Blood Loss:     Estimated blood loss was minimal. Procedure:                Pre-Anesthesia Assessment:                           - Prior to the procedure, a History and Physical                            was performed, and patient medications and                            allergies were reviewed. The patient's tolerance of                            previous anesthesia was also reviewed. The risks                            and benefits of the procedure and the sedation                            options and risks were discussed with the patient.                            All questions were answered, and informed consent                            was obtained. Prior Anticoagulants: The patient has                            taken Xarelto (rivaroxaban), last dose was 2 days  prior to procedure. ASA Grade Assessment: III - A                            patient with severe systemic disease. After                            reviewing the risks and benefits, the patient was                            deemed in satisfactory condition to undergo the                            procedure.                           After obtaining  informed consent, the colonoscope                            was passed under direct vision. Throughout the                            procedure, the patient's blood pressure, pulse, and                            oxygen saturations were monitored continuously. The                            CF-HQ190L ([id number]) Olympus colonoscope was                            introduced through the anus and advanced to the the                            cecum, identified by appendiceal orifice and                            ileocecal valve. The colonoscopy was performed                            without difficulty. The patient tolerated the                            procedure. The quality of the bowel preparation was                            adequate. The ileocecal valve, appendiceal orifice,                            and rectum were photographed. Scope In: 9:22:48 AM Scope Out: 9:58:46 AM Total Procedure Duration: 0 hours 35 minutes 58 seconds  Findings:      The digital rectal exam findings include hemorrhoids. Pertinent       negatives include no palpable rectal lesions.      A medium post mucosectomy scar was found in the cecum. Unfortunately,  there was residual polypoid tissue (much less than prior). Preparations       were made for further attempt at mucosal resection (we cannot pass the       OVESCO PROVE-IT Cap and the Diagnostic FTRD caps through the patients       diverticular disease burden unfortunately). Demarcation of the lesion       was performed with high-definition white light and narrow band imaging       to clearly identify the boundaries of the lesion. Water infusion to       perform was injected to raise the lesion. Piecemeal underwater mucosal       resection using a snare was performed. Resection was incomplete. The       resected tissue was retrieved. Fulguration to ablate the lesion remnants       by hot biopsy forceps avulsion was successful. Coagulation for tissue        destruction using argon plasma was successful. To repair the defect, the       tissue edges were approximated and six hemostatic clips were       successfully placed (MR conditional). Closure of the defect was       successful.      Multiple small-mouthed diverticula were found in the recto-sigmoid       colon, sigmoid colon and descending colon.      Normal mucosa was found in the entire colon otherwise.      Non-bleeding non-thrombosed internal hemorrhoids were found during       retroflexion, during perianal exam and during digital exam. The       hemorrhoids were Grade II (internal hemorrhoids that prolapse but reduce       spontaneously). Impression:               - Hemorrhoids found on digital rectal exam.                           - Post mucosectomy scar in the cecum. Underwater                            EMR performed. Treated with hot biopsy forceps                            avulsion. Treated with argon plasma coagulation                            (APC). Clips (MR conditional) were placed.                           - Diverticulosis in the recto-sigmoid colon, in the                            sigmoid colon and in the descending colon.                           - Normal mucosa in the entire examined colon                            otherwise.                           -  Non-bleeding non-thrombosed internal hemorrhoids. Moderate Sedation:      Not Applicable - Patient had care per Anesthesia. Recommendation:           - The patient will be observed post-procedure,                            until all discharge criteria are met.                           - Discharge patient to home.                           - Patient has a contact number available for                            emergencies. The signs and symptoms of potential                            delayed complications were discussed with the                            patient. Return to normal activities tomorrow.                             Written discharge instructions were provided to the                            patient.                           - Xarelto restart in 48 hours to decrease risk of                            bleeding.                           - Continue present medications.                           - Await pathology results.                           - Repeat colonoscopy in 9 months for surveillance.                            Discuss potential surgical referral.                           - The findings and recommendations were discussed                            with the patient.                           - The findings and recommendations were discussed  with the patient's family. Procedure Code(s):        --- Professional ---                           [phone], Colonoscopy, flexible; with endoscopic                            mucosal resection Diagnosis Code(s):        --- Professional ---                           Z86.010, Personal history of colonic polyps                           Z98.890, Other specified postprocedural states                           K64.1, Second degree hemorrhoids                           K57.30, Diverticulosis of large intestine without                            perforation or abscess without bleeding CPT copyright 2022 American Medical Association. All rights reserved. The codes documented in this report are preliminary and upon coder review may  be revised to meet current compliance requirements. Corliss Parish, MD 12/31/2022 10:25:47 AM Number of Addenda: 0

## 2022-12-31 NOTE — Anesthesia Postprocedure Evaluation (Signed)
Anesthesia Post Note  Patient: Willie Dominguez  Procedure(s) Performed: COLONOSCOPY WITH PROPOFOL ENDOSCOPIC MUCOSAL RESECTION HEMOSTASIS CLIP PLACEMENT HOT HEMOSTASIS (ARGON PLASMA COAGULATION/BICAP)     Patient location during evaluation: Endoscopy Anesthesia Type: MAC Level of consciousness: awake and sedated Pain management: pain level controlled Vital Signs Assessment: post-procedure vital signs reviewed and stable Cardiovascular status: stable Postop Assessment: no apparent nausea or vomiting Anesthetic complications: no  No notable events documented.  Last Vitals:  Vitals:   12/31/22 1002 12/31/22 1010  BP: (!) 104/53 (!) 110/59  Pulse: (!) 48 (!) 48  Resp: 13 17  Temp: 36.6 C   SpO2: 100% 97%    Last Pain:  Vitals:   12/31/22 1010  TempSrc:   PainSc: 0-No pain                 Caren Macadam
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
## 2022-12-31 NOTE — Anesthesia Preprocedure Evaluation (Signed)
Anesthesia Evaluation  Patient identified by MRN, date of birth, ID band Patient awake    Reviewed: Allergy & Precautions, NPO status , Patient's Chart, lab work & pertinent test results  Airway Mallampati: I       Dental   Pulmonary    Pulmonary exam normal        Cardiovascular hypertension, Pt. on medications Normal cardiovascular exam     Neuro/Psych  negative psych ROS   GI/Hepatic negative GI ROS, Neg liver ROS,,,  Endo/Other    Renal/GU   negative genitourinary   Musculoskeletal  (+) Arthritis ,    Abdominal  (+) + obese  Peds  Hematology   Anesthesia Other Findings Left ventricular ejection fraction, by estimation, is 65 to 70% . The left ventricle has normal function. The left ventricle has no regional wall motion abnormalities. There is mild left ventricular hypertrophy. Left ventricular diastolic parameters are indeterminate. 2. Right ventricular systolic function is low normal. The right ventricular size is mildly enlarged. 3. Left atrial size was mildly dilated. 4. The mitral valve is normal in structure. Mild mitral valve regurgitation. No evidence of mitral stenosis. 5. The aortic valve is normal in structure. Aortic valve regurgitation is mild. No aortic stenosis is present. 6. Aortic dilatation noted. There is dilatation of the aortic root, measuring 39 mm. There is dilatation of the ascending aorta, measuring 38 mm.  Comparison(  Reproductive/Obstetrics                             Anesthesia Physical Anesthesia Plan  ASA: 2  Anesthesia Plan: MAC   Post-op Pain Management:    Induction:   PONV Risk Score and Plan: Propofol infusion and TIVA  Airway Management Planned: Natural Airway, Simple Face Mask and Nasal Cannula  Additional Equipment: None  Intra-op Plan:   Post-operative Plan:   Informed Consent: I have reviewed the patients History and Physical, chart, labs  and discussed the procedure including the risks, benefits and alternatives for the proposed anesthesia with the patient or authorized representative who has indicated his/her understanding and acceptance.       Plan Discussed with:   Anesthesia Plan Comments:        Anesthesia Quick Evaluation

## 2022-12-31 NOTE — Discharge Instructions (Signed)

## 2022-12-31 NOTE — Transfer of Care (Signed)
Immediate Anesthesia Transfer of Care Note  Patient: Willie Dominguez  Procedure(s) Performed: COLONOSCOPY WITH PROPOFOL ENDOSCOPIC MUCOSAL RESECTION HEMOSTASIS CLIP PLACEMENT HOT HEMOSTASIS (ARGON PLASMA COAGULATION/BICAP)  Patient Location: PACU  Anesthesia Type:MAC  Level of Consciousness: sedated  Airway & Oxygen Therapy: Patient Spontanous Breathing and Patient connected to face mask oxygen  Post-op Assessment: Report given to RN and Post -op Vital signs reviewed and stable  Post vital signs: Reviewed and stable  Last Vitals:  Vitals Value Taken Time  BP    Temp    Pulse 48 12/31/22 1002  Resp    SpO2 100 % 12/31/22 1002  Vitals shown include unfiled device data.  Last Pain:  Vitals:   12/31/22 0747  TempSrc: Tympanic  PainSc: 0-No pain         Complications: No notable events documented.

## 2023-01-01 ENCOUNTER — Encounter (HOSPITAL_COMMUNITY): Payer: Self-pay | Admitting: Gastroenterology

## 2023-01-01 LAB — SURGICAL PATHOLOGY

## 2023-01-02 NOTE — Telephone Encounter (Signed)
I have responded to the GI teams request late yesterday, all set to hold anticoagulation 2 days prior to procedure and he can resume it afterward.  Thanks, JD

## 2023-01-02 NOTE — Telephone Encounter (Signed)
Routing to Dr. Erin Fulling for review.

## 2023-01-04 NOTE — Telephone Encounter (Signed)
Martina Sinner, MD  Loretha Stapler, RN; [place], [name], MD Xarelto should be held for 48hours prior to procedure and then resume after procedure.  Thanks, Dr. Francine Graven

## 2023-01-04 NOTE — Telephone Encounter (Signed)
Atc pt no answer, unable to leave vmm

## 2023-01-05 ENCOUNTER — Encounter: Payer: Self-pay | Admitting: Gastroenterology

## 2023-01-06 ENCOUNTER — Other Ambulatory Visit: Payer: Self-pay | Admitting: Cardiology

## 2023-01-06 DIAGNOSIS — R0609 Other forms of dyspnea: Secondary | ICD-10-CM

## 2023-01-07 ENCOUNTER — Other Ambulatory Visit: Payer: Self-pay | Admitting: Cardiology

## 2023-01-07 DIAGNOSIS — I10 Essential (primary) hypertension: Secondary | ICD-10-CM

## 2023-01-25 ENCOUNTER — Telehealth: Payer: Self-pay | Admitting: Gastroenterology

## 2023-01-25 NOTE — Telephone Encounter (Signed)
The pt has returned call and made aware that we will place a referral today to [facility]- records faxed. I did make the pt aware that it may take a couple weeks to get a call due to the holidays. I did provide the phone number to the pt to call if he has not heard from them in a few weeks.

## 2023-01-25 NOTE — Telephone Encounter (Signed)
Inbound call from patient's wife stating patient was advised after 10/31 colonoscopy he would be referred to a surgeon. Patient's wife requesting a call back to discuss information. Please advise, thank you.

## 2023-02-09 ENCOUNTER — Encounter: Payer: Self-pay | Admitting: Pulmonary Disease

## 2023-02-09 ENCOUNTER — Ambulatory Visit: Payer: 59 | Admitting: Pulmonary Disease

## 2023-02-09 VITALS — BP 126/79 | HR 66 | Ht 71.5 in | Wt 235.8 lb

## 2023-02-09 DIAGNOSIS — R0602 Shortness of breath: Secondary | ICD-10-CM

## 2023-02-09 DIAGNOSIS — J986 Disorders of diaphragm: Secondary | ICD-10-CM

## 2023-02-09 DIAGNOSIS — R1310 Dysphagia, unspecified: Secondary | ICD-10-CM

## 2023-02-09 DIAGNOSIS — I2699 Other pulmonary embolism without acute cor pulmonale: Secondary | ICD-10-CM

## 2023-02-09 DIAGNOSIS — I82511 Chronic embolism and thrombosis of right femoral vein: Secondary | ICD-10-CM | POA: Diagnosis not present

## 2023-02-09 DIAGNOSIS — R942 Abnormal results of pulmonary function studies: Secondary | ICD-10-CM

## 2023-02-09 MED ORDER — RIVAROXABAN 20 MG PO TABS: 20.0000 mg | ORAL_TABLET | Freq: Every day | ORAL | 11 refills | Status: DC

## 2023-02-09 NOTE — Patient Instructions (Addendum)
We will refer you to pulmonary rehab for your shortness of breath  We can consider a sniff test in the future to evaluate your elevated diaphragm.   We can consider a modified barium swallow in the future to evaluate your trouble swallowing  Try allegra or Zyrtec over the counter allergy medicine for the cough   We will schedule you for home sleep study  Follow up in 6 months, call sooner if needed
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
## 2023-02-12 ENCOUNTER — Telehealth (HOSPITAL_COMMUNITY): Payer: Self-pay

## 2023-02-12 NOTE — Telephone Encounter (Signed)
Called patient to see if he was interested in participating in the Pulmonary Rehab Program. Patient stated yes. Patient will come in for orientation on 02/17/23 @ 9AM and will attend the 1:15PM exercise class.   Pensions consultant.    Went over insurance, patient verbalized understanding.

## 2023-02-12 NOTE — Telephone Encounter (Signed)
Pt insurance is active and benefits verified through [facility]. Co-pay $25.00, DED $0.00/$0.00 met, out of pocket $2,500.00/$1,972.37 met, co-insurance 0%. No pre-authorization required. Jes M./[employer], 02/12/23 @ 12:02PM, ZOX#[id number]   20 visits ONLY

## 2023-02-15 ENCOUNTER — Ambulatory Visit: Payer: Self-pay | Admitting: Surgery

## 2023-02-15 ENCOUNTER — Telehealth: Payer: Self-pay | Admitting: *Deleted

## 2023-02-15 ENCOUNTER — Telehealth (HOSPITAL_COMMUNITY): Payer: Self-pay

## 2023-02-15 DIAGNOSIS — R739 Hyperglycemia, unspecified: Secondary | ICD-10-CM

## 2023-02-15 DIAGNOSIS — K429 Umbilical hernia without obstruction or gangrene: Secondary | ICD-10-CM | POA: Insufficient documentation

## 2023-02-15 NOTE — Telephone Encounter (Signed)
   Pre-operative Risk Assessment    Patient Name: Willie Dominguez  DOB: Nov 18, 1950 MRN: [id number]      Request for Surgical Clearance    Procedure:   COLON SURGERY  Date of Surgery:  Clearance TBD                                 Surgeon:  Karie Soda, MD Surgeon's Group or Practice Name:  [facility] Phone number:  [phone] Fax number:  [phone]   Type of Clearance Requested:   - Medical  - Pharmacy:  Hold Rivaroxaban (Xarelto) NOT INDICATED HOW LONG   Type of Anesthesia:  General    Additional requests/questions:    Wilhemina Cash   02/15/2023, 12:47 PM

## 2023-02-15 NOTE — Telephone Encounter (Signed)
Attempted to return pt phone call. Unable to leave VM.   Pt left voicemail, stating he will like to cancel all PR at this time and will like to wait until 2025.  Closed referral

## 2023-02-15 NOTE — Telephone Encounter (Signed)
   Name: Willie Dominguez  DOB: 12-07-1950  MRN: [id number]  Primary Cardiologist: None   Preoperative team, please contact this patient and set up a phone call appointment for further preoperative risk assessment. Please obtain consent and complete medication review. Thank you for your help.  I confirm that guidance regarding antiplatelet and oral anticoagulation therapy has been completed and, if necessary, noted below.  Patient is on Xarelto for history of recurrent PE/DVT, this is not managed by cardiology.  Recommendations for holding Xarelto prior to colon surgery should come from managing provider (hematology).  I also confirmed the patient resides in the state of [place]. As per [facility] Medical Board telemedicine laws, the patient must reside in the state in which the provider is licensed.   Joylene Grapes, NP 02/15/2023, 3:52 PM [facility]

## 2023-02-15 NOTE — Telephone Encounter (Signed)
Pt has been scheduled tele preop appt 03/02/23. Med rec and consent are done.     Patient Consent for Virtual Visit        Willie Dominguez has provided verbal consent on 02/15/2023 for a virtual visit (video or telephone).   CONSENT FOR VIRTUAL VISIT FOR:  Willie Dominguez  By participating in this virtual visit I agree to the following:  I hereby voluntarily request, consent and authorize [facility] and its employed or contracted physicians, physician assistants, nurse practitioners or other licensed health care professionals (the Practitioner), to provide me with telemedicine health care services (the "Services") as deemed necessary by the treating Practitioner. I acknowledge and consent to receive the Services by the Practitioner via telemedicine. I understand that the telemedicine visit will involve communicating with the Practitioner through live audiovisual communication technology and the disclosure of certain medical information by electronic transmission. I acknowledge that I have been given the opportunity to request an in-person assessment or other available alternative prior to the telemedicine visit and am voluntarily participating in the telemedicine visit.  I understand that I have the right to withhold or withdraw my consent to the use of telemedicine in the course of my care at any time, without affecting my right to future care or treatment, and that the Practitioner or I may terminate the telemedicine visit at any time. I understand that I have the right to inspect all information obtained and/or recorded in the course of the telemedicine visit and may receive copies of available information for a reasonable fee.  I understand that some of the potential risks of receiving the Services via telemedicine include:  Delay or interruption in medical evaluation due to technological equipment failure or disruption; Information transmitted may not be sufficient (e.g. poor  resolution of images) to allow for appropriate medical decision making by the Practitioner; and/or  In rare instances, security protocols could fail, causing a breach of personal health information.  Furthermore, I acknowledge that it is my responsibility to provide information about my medical history, conditions and care that is complete and accurate to the best of my ability. I acknowledge that Practitioner's advice, recommendations, and/or decision may be based on factors not within their control, such as incomplete or inaccurate data provided by me or distortions of diagnostic images or specimens that may result from electronic transmissions. I understand that the practice of medicine is not an exact science and that Practitioner makes no warranties or guarantees regarding treatment outcomes. I acknowledge that a copy of this consent can be made available to me via my patient portal [facility] MyChart), or I can request a printed copy by calling the office of [facility].    I understand that my insurance will be billed for this visit.   I have read or had this consent read to me. I understand the contents of this consent, which adequately explains the benefits and risks of the Services being provided via telemedicine.  I have been provided ample opportunity to ask questions regarding this consent and the Services and have had my questions answered to my satisfaction. I give my informed consent for the services to be provided through the use of telemedicine in my medical care

## 2023-02-15 NOTE — Telephone Encounter (Signed)
 Pt has been scheduled tele pre op appt 03/02/23. Med rec and consent are done.

## 2023-02-16 ENCOUNTER — Telehealth: Payer: Self-pay | Admitting: Family Medicine

## 2023-02-16 ENCOUNTER — Ambulatory Visit: Payer: 59 | Admitting: Family Medicine

## 2023-02-16 VITALS — BP 118/70 | HR 56 | Temp 98.2°F | Wt 239.0 lb

## 2023-02-16 DIAGNOSIS — R0602 Shortness of breath: Secondary | ICD-10-CM

## 2023-02-16 DIAGNOSIS — R942 Abnormal results of pulmonary function studies: Secondary | ICD-10-CM

## 2023-02-16 DIAGNOSIS — B029 Zoster without complications: Secondary | ICD-10-CM | POA: Diagnosis not present

## 2023-02-16 MED ORDER — VALACYCLOVIR HCL 1 G PO TABS: 1000.0000 mg | ORAL_TABLET | Freq: Three times a day (TID) | ORAL | 0 refills | Status: DC

## 2023-02-16 NOTE — Progress Notes (Signed)
   Subjective:    Patient ID: Willie Dominguez, male    DOB: 1950/11/18, 72 y.o.   MRN: [id number]  HPI Here for 6 days of a rash that burns on the left buttock. At first this was a patch of blisters, but now they are drying up. No other areas are involved. He has never had this before.    Review of Systems  Constitutional: Negative.   Respiratory: Negative.    Cardiovascular: Negative.   Skin:  Positive for rash.       Objective:   Physical Exam Constitutional:      Appearance: Normal appearance.  Cardiovascular:     Rate and Rhythm: Normal rate and regular rhythm.     Pulses: Normal pulses.     Heart sounds: Normal heart sounds.  Pulmonary:     Effort: Pulmonary effort is normal.     Breath sounds: Normal breath sounds.  Skin:    Comments: There is a small patch of scaly crusty red skin on the superior left buttock near the intergluteal fold.   Neurological:     Mental Status: He is alert.           Assessment & Plan:  Shingles, treat with 10 days of Valtrex.  Gershon Crane, MD

## 2023-02-16 NOTE — Telephone Encounter (Signed)
Patient saw Dr Clent Ridges on 02/16/2023 and stated that Dr Clent Ridges is willing to accept him as his patient.  Are both Physicians in agreement?

## 2023-02-17 ENCOUNTER — Ambulatory Visit (HOSPITAL_COMMUNITY): Payer: 59

## 2023-02-17 NOTE — Telephone Encounter (Signed)
Fine with me. BJ 

## 2023-02-18 ENCOUNTER — Telehealth: Payer: Self-pay | Admitting: Pulmonary Disease

## 2023-02-18 NOTE — Telephone Encounter (Signed)
Fax received from Dr. Karie Soda with [facility] to perform a colon surgery under general anesthesia on patient.  Patient needs surgery clearance. Surgery is pending. Patient was seen on 02/09/23. Office protocol is a risk assessment can be sent to surgeon if patient has been seen in 60 days or less.   Sending to Dr. Francine Graven for risk assessment or recommendations if patient needs to be seen in office prior to surgical procedure.

## 2023-02-18 NOTE — Telephone Encounter (Signed)
Spoke with pt advised that both Dr Clent Ridges and Dr [place] are ok with him switching to Dr Clent Ridges for his care, pt will call the office to schedule a Wellness with Dr Clent Ridges for 2025

## 2023-02-23 ENCOUNTER — Ambulatory Visit (HOSPITAL_COMMUNITY): Payer: 59

## 2023-02-25 ENCOUNTER — Ambulatory Visit (HOSPITAL_COMMUNITY): Payer: 59

## 2023-02-25 NOTE — Telephone Encounter (Signed)
ARISCAT Score for Postoperative Pulmonary Complications  Intermediate risk 13.3% risk of in-hospital post-op pulmonary complications (composite including respiratory failure, respiratory infection, pleural effusion, atelectasis, pneumothorax, bronchospasm, aspiration pneumonitis)  Recommend holding Xarelto 2 days prior to surgery with a lovenox bridge until safe to resume Xarelto per surgery.  Dr. Francine Graven

## 2023-03-02 ENCOUNTER — Ambulatory Visit: Payer: 59 | Attending: Physician Assistant

## 2023-03-02 ENCOUNTER — Ambulatory Visit (HOSPITAL_COMMUNITY): Payer: 59

## 2023-03-02 DIAGNOSIS — Z01810 Encounter for preprocedural cardiovascular examination: Secondary | ICD-10-CM | POA: Diagnosis not present
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
## 2023-03-04 ENCOUNTER — Ambulatory Visit (HOSPITAL_COMMUNITY): Payer: 59

## 2023-03-05 NOTE — Telephone Encounter (Signed)
 Copy of this encounter was faxed to [facility]

## 2023-03-08 ENCOUNTER — Telehealth: Payer: Self-pay | Admitting: *Deleted

## 2023-03-08 NOTE — Telephone Encounter (Signed)
 Patwardhan, Newman PARAS, MD  Lucien Orren SAILOR, PA-C; Gladis Eans M, LPN; P Cv Div [facility] Triage His last echocardiogram was in 05/2022. I last saw him in 06/2022 and recommended repeat echocardiogram in 1 year.  Without the upcoming colon surgery, he would not usually have needed echocardiogram until 06/2023.  With upcoming surgery, reasonable to obtain one sooner ie now.  IV/triage, could you please arrange this?  Thanks MJP       Previous Messages    ----- Message ----- From: Lucien Orren SAILOR DEVONNA Sent: 03/02/2023   1:50 PM EST To: Newman PARAS Lawrence, MD; Eans CHRISTELLA Gladis, LPN  Hi Dr. Lawrence,  I just wanted to make you aware that this patient presented for telephone visit regarding his cardiac clearance for upcoming colon surgery.  He tells me that his echo is scheduled with old office and needs to be moved and also tells me that his follow-up visit was never rescheduled with you.  I have also attached Gerry Heaphy to help get this straightened out.  Thanks!

## 2023-03-08 NOTE — Telephone Encounter (Signed)
 Pt is scheduled for an echo on 05/26/23 and he is due for his next follow-up appt with Dr. Elmira in Sept 2025 (recalled).   Pt will keep scheduled echo for 3/26, unless Orren Fabry PA-C wants his echo moved to a sooner date than this appt date.   Scheduling will arrange his next follow-up appt with Dr. Elmira (due in Sept 2025 and recall placed) closer to that time,  when the schedule is open and available to do so.  Will route this back to [facility], to make sure timing of these appts are ok for the pt.

## 2023-03-09 ENCOUNTER — Ambulatory Visit (HOSPITAL_COMMUNITY): Payer: 59

## 2023-03-09 NOTE — Telephone Encounter (Signed)
 Message Received: Today Loma Sender, LPN Caller: Unspecified (Yesterday,  4:45 PM) I called patient and LVM.

## 2023-03-09 NOTE — Telephone Encounter (Signed)
  Lucien Orren SAILOR, PA-C  Jackston [place]aca M, LPN Caller: Unspecified (Yesterday,  4:45 PM) I think sooner would be better given upcoming colon surgery.  Orren SAILOR Lucien, PA-C   Will send this message to our Echo Scheduler to call the pt back and have his echo moved to a sooner date (next available), pending upcoming colon surgery per Orren Lucien PA-C.

## 2023-03-11 ENCOUNTER — Ambulatory Visit (HOSPITAL_COMMUNITY): Payer: 59

## 2023-03-11 ENCOUNTER — Encounter: Payer: Self-pay | Admitting: Pulmonary Disease

## 2023-03-14 ENCOUNTER — Other Ambulatory Visit: Payer: Self-pay | Admitting: Pulmonary Disease

## 2023-03-14 DIAGNOSIS — R059 Cough, unspecified: Secondary | ICD-10-CM

## 2023-03-15 NOTE — Telephone Encounter (Signed)
 LOV 02-09-2023. Pt request Montelucast to be refilled. In the last note you (Dr. Francine Graven)  instructed to get   Allegra or Zyrtec OTC. Please Advise

## 2023-03-16 ENCOUNTER — Ambulatory Visit (HOSPITAL_COMMUNITY): Payer: 59 | Attending: Cardiology

## 2023-03-16 ENCOUNTER — Ambulatory Visit (HOSPITAL_COMMUNITY): Payer: 59

## 2023-03-16 DIAGNOSIS — I7121 Aneurysm of the ascending aorta, without rupture: Secondary | ICD-10-CM | POA: Insufficient documentation

## 2023-03-16 LAB — ECHOCARDIOGRAM COMPLETE
Area-P 1/2: 2.85 cm2
Est EF: 50
S' Lateral: 3.3 cm

## 2023-03-18 ENCOUNTER — Ambulatory Visit (HOSPITAL_COMMUNITY): Payer: 59

## 2023-03-18 ENCOUNTER — Encounter: Payer: Self-pay | Admitting: Cardiology

## 2023-03-19 ENCOUNTER — Other Ambulatory Visit: Payer: Self-pay | Admitting: Pulmonary Disease

## 2023-03-19 DIAGNOSIS — I2699 Other pulmonary embolism without acute cor pulmonale: Secondary | ICD-10-CM

## 2023-03-23 ENCOUNTER — Ambulatory Visit (HOSPITAL_COMMUNITY): Payer: 59

## 2023-03-25 ENCOUNTER — Ambulatory Visit (HOSPITAL_COMMUNITY): Payer: 59

## 2023-03-30 ENCOUNTER — Ambulatory Visit (HOSPITAL_COMMUNITY): Payer: 59

## 2023-04-01 ENCOUNTER — Ambulatory Visit (HOSPITAL_COMMUNITY): Payer: 59

## 2023-04-02 NOTE — Patient Instructions (Signed)
SURGICAL WAITING ROOM VISITATION  Patients having surgery or a procedure may have no more than 2 support people in the waiting area - these visitors may rotate.    Children under the age of 8 must have an adult with them who is not the patient.  Due to an increase in RSV and influenza rates and associated hospitalizations, children ages 81 and under may not visit patients in [facility] hospitals.  Visitors with respiratory illnesses are discouraged from visiting and should remain at home.  If the patient needs to stay at the hospital during part of their recovery, the visitor guidelines for inpatient rooms apply. Pre-op nurse will coordinate an appropriate time for 1 support person to accompany patient in pre-op.  This support person may not rotate.    Please refer to the [facility] website for the visitor guidelines for Inpatients (after your surgery is over and you are in a regular room).       Your procedure is scheduled on:  04/15/2023    Report to [facility] Main Entrance    Report to admitting at   0900AM   Call this number if you have problems the morning of surgery [phone] Clear liquid diet on day of bowel prep.  Follow bowel prep instructions.     After Midnight you may have the following liquids until _ 0800_____ AM  DAY OF SURGERY  Water Non-Citrus Juices (without pulp, NO RED-Apple, White grape, White cranberry) Black Coffee (NO MILK/CREAM OR CREAMERS, sugar ok)  Clear Tea (NO MILK/CREAM OR CREAMERS, sugar ok) regular and decaf                             Plain Jell-O (NO RED)                                           Fruit ices (not with fruit pulp, NO RED)                                     Popsicles (NO RED)                                                               Sports drinks like Gatorade (NO RED)              Drink 2 Ensure/G2 drinks AT 10:00 PM the night before surgery.        The day of surgery:  Drink ONE (1) Pre-Surgery Clear  Ensure or G2 at 0800 AM  ( have completed by ) the morning of surgery. Drink in one sitting. Do not sip.  This drink was given to you during your hospital  pre-op appointment visit. Nothing else to drink after completing the  Pre-Surgery Clear Ensure or G2.          If you have questions, please contact your surgeon's office.   FOLLOW BOWEL PREP AND ANY ADDITIONAL PRE OP INSTRUCTIONS YOU RECEIVED FROM YOUR SURGEON'S OFFICE!!!     Oral Hygiene is also important to reduce your  risk of infection.                                    Remember - BRUSH YOUR TEETH THE MORNING OF SURGERY WITH YOUR REGULAR TOOTHPASTE  DENTURES WILL BE REMOVED PRIOR TO SURGERY PLEASE DO NOT APPLY "Poly grip" OR ADHESIVES!!!   Do NOT smoke after Midnight   Stop all vitamins and herbal supplements 7 days before surgery.   Take these medicines the morning of surgery with A SIP OF WATER:  none   DO NOT TAKE ANY ORAL DIABETIC MEDICATIONS DAY OF YOUR SURGERY  Bring CPAP mask and tubing day of surgery.                              You may not have any metal on your body including hair pins, jewelry, and body piercing             Do not wear make-up, lotions, powders, perfumes/cologne, or deodorant  Do not wear nail polish including gel and S&S, artificial/acrylic nails, or any other type of covering on natural nails including finger and toenails. If you have artificial nails, gel coating, etc. that needs to be removed by a nail salon please have this removed prior to surgery or surgery may need to be canceled/ delayed if the surgeon/ anesthesia feels like they are unable to be safely monitored.   Do not shave  48 hours prior to surgery.               Men may shave face and neck.   Do not bring valuables to the hospital. [facility] IS NOT             RESPONSIBLE   FOR VALUABLES.   Contacts, glasses, dentures or bridgework may not be worn into surgery.   Bring small overnight bag day of surgery.   DO NOT  BRING YOUR HOME MEDICATIONS TO THE HOSPITAL. PHARMACY WILL DISPENSE MEDICATIONS LISTED ON YOUR MEDICATION LIST TO YOU DURING YOUR ADMISSION IN THE HOSPITAL!    Patients discharged on the day of surgery will not be allowed to drive home.  Someone NEEDS to stay with you for the first 24 hours after anesthesia.   Special Instructions: Bring a copy of your healthcare power of attorney and living will documents the day of surgery if you haven't scanned them before.              Please read over the following fact sheets you were given: IF YOU HAVE QUESTIONS ABOUT YOUR PRE-OP INSTRUCTIONS PLEASE CALL [phone]   If you received a COVID test during your pre-op visit  it is requested that you wear a mask when out in public, stay away from anyone that may not be feeling well and notify your surgeon if you develop symptoms. If you test positive for Covid or have been in contact with anyone that has tested positive in the last 10 days please notify you surgeon.    [facility] - Preparing for Surgery Before surgery, you can play an important role.  Because skin is not sterile, your skin needs to be as free of germs as possible.  You can reduce the number of germs on your skin by washing with CHG (chlorahexidine gluconate) soap before surgery.  CHG is an antiseptic cleaner which kills germs and bonds with the  skin to continue killing germs even after washing. Please DO NOT use if you have an allergy to CHG or antibacterial soaps.  If your skin becomes reddened/irritated stop using the CHG and inform your nurse when you arrive at Short Stay. Do not shave (including legs and underarms) for at least 48 hours prior to the first CHG shower.  You may shave your face/neck. Please follow these instructions carefully:  1.  Shower with CHG Soap the night before surgery and the  morning of Surgery.  2.  If you choose to wash your hair, wash your hair first as usual with your  normal  shampoo.  3.  After you shampoo,  rinse your hair and body thoroughly to remove the  shampoo.                           4.  Use CHG as you would any other liquid soap.  You can apply chg directly  to the skin and wash                       Gently with a scrungie or clean washcloth.  5.  Apply the CHG Soap to your body ONLY FROM THE NECK DOWN.   Do not use on face/ open                           Wound or open sores. Avoid contact with eyes, ears mouth and genitals (private parts).                       Wash face,  Genitals (private parts) with your normal soap.             6.  Wash thoroughly, paying special attention to the area where your surgery  will be performed.  7.  Thoroughly rinse your body with warm water from the neck down.  8.  DO NOT shower/wash with your normal soap after using and rinsing off  the CHG Soap.                9.  Pat yourself dry with a clean towel.            10.  Wear clean pajamas.            11.  Place clean sheets on your bed the night of your first shower and do not  sleep with pets. Day of Surgery : Do not apply any lotions/deodorants the morning of surgery.  Please wear clean clothes to the hospital/surgery center.  FAILURE TO FOLLOW THESE INSTRUCTIONS MAY RESULT IN THE CANCELLATION OF YOUR SURGERY PATIENT SIGNATURE_________________________________  NURSE SIGNATURE__________________________________  ________________________________________________________________________

## 2023-04-02 NOTE — Progress Notes (Signed)
Anesthesia Review:  PCP: Willie Dominguez LOV 02/16/23  Cardiologist :  Willie Dominguez 06/03/22- LOV Willie Dominguez telephone visit preop 03/02/23  Pulmonology- Willie Dominguez- LOV 02/09/23  Chest x-ray : EKG : 06/03/22  PFT- 07/23/22  Echo : 03/16/23  CT Chest- 12/01/22  Stress test: Cardiac Cath :  Activity level:  Sleep Study/ CPAP : Fasting Blood Sugar :      / Checks Blood Sugar -- times a day:   Blood Thinner/ Instructions /Last Dose: ASA / Instructions/ Last Dose :    Hgba1c-

## 2023-04-05 ENCOUNTER — Encounter (HOSPITAL_COMMUNITY): Payer: Self-pay

## 2023-04-05 ENCOUNTER — Other Ambulatory Visit: Payer: Self-pay

## 2023-04-05 ENCOUNTER — Encounter (HOSPITAL_COMMUNITY)
Admission: RE | Admit: 2023-04-05 | Discharge: 2023-04-05 | Disposition: A | Discharge status: Home / Self Care | Payer: 59 | Source: Ambulatory Visit | Attending: Surgery | Admitting: Surgery

## 2023-04-05 VITALS — BP 125/78 | HR 62 | Temp 98.0°F | Resp 16 | Ht 72.0 in | Wt 233.0 lb

## 2023-04-05 DIAGNOSIS — Z01818 Encounter for other preprocedural examination: Secondary | ICD-10-CM

## 2023-04-05 DIAGNOSIS — R739 Hyperglycemia, unspecified: Secondary | ICD-10-CM | POA: Insufficient documentation

## 2023-04-05 DIAGNOSIS — Z01812 Encounter for preprocedural laboratory examination: Secondary | ICD-10-CM | POA: Insufficient documentation

## 2023-04-05 DIAGNOSIS — Z86718 Personal history of other venous thrombosis and embolism: Secondary | ICD-10-CM | POA: Insufficient documentation

## 2023-04-05 DIAGNOSIS — I2510 Atherosclerotic heart disease of native coronary artery without angina pectoris: Secondary | ICD-10-CM | POA: Diagnosis not present

## 2023-04-05 DIAGNOSIS — Z86711 Personal history of pulmonary embolism: Secondary | ICD-10-CM | POA: Insufficient documentation

## 2023-04-05 DIAGNOSIS — K635 Polyp of colon: Secondary | ICD-10-CM | POA: Diagnosis not present

## 2023-04-05 DIAGNOSIS — I10 Essential (primary) hypertension: Secondary | ICD-10-CM | POA: Insufficient documentation

## 2023-04-05 HISTORY — DX: Dyspnea, unspecified: R06.00

## 2023-04-05 LAB — CBC
HCT: 41.1 % (ref 39.0–52.0)
Hemoglobin: 13.5 g/dL (ref 13.0–17.0)
MCH: 31.3 pg (ref 26.0–34.0)
MCHC: 32.8 g/dL (ref 30.0–36.0)
MCV: 95.1 fL (ref 80.0–100.0)
Platelets: 110 10*3/uL — ABNORMAL LOW (ref 150–400)
RBC: 4.32 MIL/uL (ref 4.22–5.81)
RDW: 13.6 % (ref 11.5–15.5)
WBC: 3.9 10*3/uL — ABNORMAL LOW (ref 4.0–10.5)
nRBC: 0 % (ref 0.0–0.2)

## 2023-04-05 LAB — BASIC METABOLIC PANEL
Anion gap: 8 (ref 5–15)
BUN: 18 mg/dL (ref 8–23)
CO2: 24 mmol/L (ref 22–32)
Calcium: 8.9 mg/dL (ref 8.9–10.3)
Chloride: 106 mmol/L (ref 98–111)
Creatinine, Ser: 1.17 mg/dL (ref 0.61–1.24)
GFR, Estimated: >60 mL/min (ref >60)
Glucose, Bld: 116 mg/dL — ABNORMAL HIGH (ref 70–99)
Potassium: 4 mmol/L (ref 3.5–5.1)
Sodium: 138 mmol/L (ref 135–145)

## 2023-04-05 LAB — HEMOGLOBIN A1C
Hgb A1c MFr Bld: 5.3 % (ref 4.8–5.6)
Mean Plasma Glucose: 105.41 mg/dL

## 2023-04-06 ENCOUNTER — Ambulatory Visit (HOSPITAL_COMMUNITY): Payer: 59

## 2023-04-06 NOTE — Anesthesia Preprocedure Evaluation (Addendum)
Anesthesia Evaluation  Patient identified by MRN, date of birth, ID band Patient awake    Reviewed: Allergy & Precautions, NPO status , Patient's Chart, lab work & pertinent test results  History of Anesthesia Complications Negative for: history of anesthetic complications  Airway Mallampati: II  TM Distance: >3 FB Neck ROM: Full    Dental no notable dental hx.    Pulmonary    Pulmonary exam normal        Cardiovascular hypertension, Pt. on medications + CAD, + Cardiac Stents and + DVT  PERIPHERAL VASCULAR DISEASE: on Xarelto.Normal cardiovascular exam  Echo 03/16/2023: EF 50%, global hypokinesis, grade I DD, mild RVE, mild dilatation of aortic root measuring 42mm, mild dilatation of ascending aorta measuring 42mm     Neuro/Psych negative neurological ROS     GI/Hepatic negative GI ROS, Neg liver ROS,,,  Endo/Other  negative endocrine ROS    Renal/GU negative Renal ROS  negative genitourinary   Musculoskeletal  (+) Arthritis ,    Abdominal   Peds  Hematology negative hematology ROS (+)   Anesthesia Other Findings Day of surgery medications reviewed with patient.  Reproductive/Obstetrics negative OB ROS                             Anesthesia Physical Anesthesia Plan  ASA: 3  Anesthesia Plan: General   Post-op Pain Management: Tylenol PO (pre-op)*   Induction: Intravenous  PONV Risk Score and Plan: 3 and Ondansetron, Dexamethasone and Treatment may vary due to age or medical condition  Airway Management Planned: Oral ETT  Additional Equipment: None  Intra-op Plan:   Post-operative Plan: Extubation in OR  Informed Consent: I have reviewed the patients History and Physical, chart, labs and discussed the procedure including the risks, benefits and alternatives for the proposed anesthesia with the patient or authorized representative who has indicated his/her understanding and  acceptance.     Dental advisory given  Plan Discussed with: CRNA  Anesthesia Plan Comments: (See PAT note 04/05/23)       Anesthesia Quick Evaluation
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
## 2023-04-08 ENCOUNTER — Ambulatory Visit (HOSPITAL_COMMUNITY): Payer: 59

## 2023-04-13 ENCOUNTER — Ambulatory Visit (HOSPITAL_COMMUNITY): Payer: 59

## 2023-04-15 ENCOUNTER — Encounter (HOSPITAL_COMMUNITY): Payer: Self-pay | Admitting: Surgery

## 2023-04-15 ENCOUNTER — Other Ambulatory Visit: Payer: Self-pay

## 2023-04-15 ENCOUNTER — Inpatient Hospital Stay (HOSPITAL_COMMUNITY)
Admission: RE | Admit: 2023-04-15 | Discharge: 2023-04-18 | DRG: 330 | Disposition: A | Discharge status: Home / Self Care | Payer: 59 | Attending: Surgery | Admitting: Surgery

## 2023-04-15 ENCOUNTER — Encounter (HOSPITAL_COMMUNITY)
Admission: RE | Disposition: A | Discharge status: Home / Self Care | Payer: Self-pay | Source: Home / Self Care | Attending: Surgery

## 2023-04-15 ENCOUNTER — Inpatient Hospital Stay (HOSPITAL_COMMUNITY): Payer: 59 | Admitting: Anesthesiology

## 2023-04-15 ENCOUNTER — Ambulatory Visit (HOSPITAL_COMMUNITY): Payer: 59

## 2023-04-15 ENCOUNTER — Other Ambulatory Visit (HOSPITAL_COMMUNITY): Payer: Self-pay

## 2023-04-15 ENCOUNTER — Inpatient Hospital Stay (HOSPITAL_COMMUNITY): Payer: 59 | Admitting: Physician Assistant

## 2023-04-15 DIAGNOSIS — E66811 Obesity, class 1: Secondary | ICD-10-CM | POA: Diagnosis present

## 2023-04-15 DIAGNOSIS — G609 Hereditary and idiopathic neuropathy, unspecified: Secondary | ICD-10-CM | POA: Diagnosis present

## 2023-04-15 DIAGNOSIS — R0609 Other forms of dyspnea: Secondary | ICD-10-CM | POA: Diagnosis present

## 2023-04-15 DIAGNOSIS — Z6833 Body mass index (BMI) 33.0-33.9, adult: Secondary | ICD-10-CM

## 2023-04-15 DIAGNOSIS — D696 Thrombocytopenia, unspecified: Secondary | ICD-10-CM | POA: Diagnosis present

## 2023-04-15 DIAGNOSIS — Z8616 Personal history of COVID-19: Secondary | ICD-10-CM | POA: Diagnosis not present

## 2023-04-15 DIAGNOSIS — N400 Enlarged prostate without lower urinary tract symptoms: Secondary | ICD-10-CM | POA: Diagnosis present

## 2023-04-15 DIAGNOSIS — M1990 Unspecified osteoarthritis, unspecified site: Secondary | ICD-10-CM | POA: Diagnosis present

## 2023-04-15 DIAGNOSIS — D120 Benign neoplasm of cecum: Secondary | ICD-10-CM | POA: Diagnosis present

## 2023-04-15 DIAGNOSIS — E785 Hyperlipidemia, unspecified: Secondary | ICD-10-CM | POA: Diagnosis present

## 2023-04-15 DIAGNOSIS — K660 Peritoneal adhesions (postprocedural) (postinfection): Secondary | ICD-10-CM | POA: Diagnosis present

## 2023-04-15 DIAGNOSIS — I2510 Atherosclerotic heart disease of native coronary artery without angina pectoris: Secondary | ICD-10-CM

## 2023-04-15 DIAGNOSIS — J449 Chronic obstructive pulmonary disease, unspecified: Secondary | ICD-10-CM | POA: Diagnosis present

## 2023-04-15 DIAGNOSIS — I10 Essential (primary) hypertension: Secondary | ICD-10-CM | POA: Diagnosis not present

## 2023-04-15 DIAGNOSIS — E7841 Elevated Lipoprotein(a): Secondary | ICD-10-CM | POA: Diagnosis present

## 2023-04-15 DIAGNOSIS — H26101 Unspecified traumatic cataract, right eye: Secondary | ICD-10-CM | POA: Diagnosis present

## 2023-04-15 DIAGNOSIS — Z880 Allergy status to penicillin: Secondary | ICD-10-CM

## 2023-04-15 DIAGNOSIS — D122 Benign neoplasm of ascending colon: Secondary | ICD-10-CM | POA: Diagnosis present

## 2023-04-15 DIAGNOSIS — Z01818 Encounter for other preprocedural examination: Secondary | ICD-10-CM | POA: Diagnosis not present

## 2023-04-15 DIAGNOSIS — D649 Anemia, unspecified: Secondary | ICD-10-CM | POA: Diagnosis not present

## 2023-04-15 DIAGNOSIS — Z833 Family history of diabetes mellitus: Secondary | ICD-10-CM

## 2023-04-15 DIAGNOSIS — K420 Umbilical hernia with obstruction, without gangrene: Secondary | ICD-10-CM | POA: Diagnosis present

## 2023-04-15 DIAGNOSIS — Z860101 Personal history of adenomatous and serrated colon polyps: Secondary | ICD-10-CM

## 2023-04-15 DIAGNOSIS — Z7902 Long term (current) use of antithrombotics/antiplatelets: Secondary | ICD-10-CM

## 2023-04-15 DIAGNOSIS — Z7901 Long term (current) use of anticoagulants: Secondary | ICD-10-CM | POA: Diagnosis not present

## 2023-04-15 DIAGNOSIS — G629 Polyneuropathy, unspecified: Secondary | ICD-10-CM | POA: Diagnosis present

## 2023-04-15 DIAGNOSIS — H5461 Unqualified visual loss, right eye, normal vision left eye: Secondary | ICD-10-CM | POA: Diagnosis present

## 2023-04-15 DIAGNOSIS — L905 Scar conditions and fibrosis of skin: Secondary | ICD-10-CM | POA: Diagnosis present

## 2023-04-15 DIAGNOSIS — K6389 Other specified diseases of intestine: Secondary | ICD-10-CM | POA: Diagnosis present

## 2023-04-15 DIAGNOSIS — I7121 Aneurysm of the ascending aorta, without rupture: Secondary | ICD-10-CM | POA: Diagnosis present

## 2023-04-15 DIAGNOSIS — I712 Thoracic aortic aneurysm, without rupture, unspecified: Secondary | ICD-10-CM | POA: Diagnosis present

## 2023-04-15 DIAGNOSIS — Z801 Family history of malignant neoplasm of trachea, bronchus and lung: Secondary | ICD-10-CM

## 2023-04-15 DIAGNOSIS — Z860100 Personal history of colon polyps, unspecified: Secondary | ICD-10-CM

## 2023-04-15 DIAGNOSIS — Z87442 Personal history of urinary calculi: Secondary | ICD-10-CM

## 2023-04-15 DIAGNOSIS — K5900 Constipation, unspecified: Secondary | ICD-10-CM | POA: Diagnosis present

## 2023-04-15 DIAGNOSIS — Z79899 Other long term (current) drug therapy: Secondary | ICD-10-CM

## 2023-04-15 DIAGNOSIS — K429 Umbilical hernia without obstruction or gangrene: Secondary | ICD-10-CM | POA: Diagnosis present

## 2023-04-15 DIAGNOSIS — K639 Disease of intestine, unspecified: Principal | ICD-10-CM | POA: Insufficient documentation

## 2023-04-15 DIAGNOSIS — Z888 Allergy status to other drugs, medicaments and biological substances status: Secondary | ICD-10-CM

## 2023-04-15 DIAGNOSIS — Z825 Family history of asthma and other chronic lower respiratory diseases: Secondary | ICD-10-CM

## 2023-04-15 DIAGNOSIS — Z9889 Other specified postprocedural states: Secondary | ICD-10-CM

## 2023-04-15 DIAGNOSIS — Z86711 Personal history of pulmonary embolism: Secondary | ICD-10-CM | POA: Diagnosis present

## 2023-04-15 DIAGNOSIS — Z6831 Body mass index (BMI) 31.0-31.9, adult: Secondary | ICD-10-CM

## 2023-04-15 DIAGNOSIS — Z86718 Personal history of other venous thrombosis and embolism: Secondary | ICD-10-CM

## 2023-04-15 DIAGNOSIS — Z955 Presence of coronary angioplasty implant and graft: Secondary | ICD-10-CM

## 2023-04-15 DIAGNOSIS — Z87892 Personal history of anaphylaxis: Secondary | ICD-10-CM

## 2023-04-15 DIAGNOSIS — Z8249 Family history of ischemic heart disease and other diseases of the circulatory system: Secondary | ICD-10-CM

## 2023-04-15 LAB — TYPE AND SCREEN
ABO/RH(D): A NEG
Antibody Screen: NEGATIVE

## 2023-04-15 LAB — ABO/RH: ABO/RH(D): A NEG

## 2023-04-15 SURGERY — COLECTOMY, SIGMOID, ROBOT-ASSISTED
Anesthesia: General

## 2023-04-15 MED ORDER — ENSURE PRE-SURGERY PO LIQD
296.0000 mL | Freq: Once | ORAL | Status: DC
Start: 2023-04-15 — End: 2023-04-15
  Filled 2023-04-15: qty 296

## 2023-04-15 MED ORDER — SODIUM CHLORIDE 0.9% FLUSH
3.0000 mL | Freq: Two times a day (BID) | INTRAVENOUS | Status: DC
  Administered 2023-04-15 – 2023-04-17 (×4): 3 mL via INTRAVENOUS

## 2023-04-15 MED ORDER — LIDOCAINE 2% (20 MG/ML) 5 ML SYRINGE
INTRAMUSCULAR | Status: DC | PRN
  Administered 2023-04-15: 100 mg via INTRAVENOUS

## 2023-04-15 MED ORDER — ALUM & MAG HYDROXIDE-SIMETH 200-200-20 MG/5ML PO SUSP: 30.0000 mL | Freq: Four times a day (QID) | ORAL | Status: DC | PRN

## 2023-04-15 MED ORDER — HYDROMORPHONE HCL 2 MG/ML IJ SOLN
INTRAMUSCULAR | Status: AC
  Filled 2023-04-15: qty 1

## 2023-04-15 MED ORDER — ROCURONIUM BROMIDE 10 MG/ML (PF) SYRINGE
PREFILLED_SYRINGE | INTRAVENOUS | Status: DC | PRN
Start: 2023-04-15 — End: 2023-04-15
  Administered 2023-04-15: 70 mg via INTRAVENOUS
  Administered 2023-04-15: 30 mg via INTRAVENOUS

## 2023-04-15 MED ORDER — MENTHOL 3 MG MT LOZG: 1.0000 | LOZENGE | OROMUCOSAL | Status: DC | PRN

## 2023-04-15 MED ORDER — ENOXAPARIN SODIUM 40 MG/0.4ML IJ SOSY
40.0000 mg | PREFILLED_SYRINGE | Freq: Once | INTRAMUSCULAR | Status: AC
  Administered 2023-04-15: 40 mg via SUBCUTANEOUS
  Filled 2023-04-15: qty 0.4

## 2023-04-15 MED ORDER — DROPERIDOL 2.5 MG/ML IJ SOLN: 0.6250 mg | Freq: Once | INTRAMUSCULAR | Status: DC | PRN

## 2023-04-15 MED ORDER — BUPIVACAINE LIPOSOME 1.3 % IJ SUSP: 20.0000 mL | Freq: Once | INTRAMUSCULAR | Status: DC

## 2023-04-15 MED ORDER — ONDANSETRON HCL 4 MG/2ML IJ SOLN: 4.0000 mg | Freq: Four times a day (QID) | INTRAMUSCULAR | Status: DC | PRN

## 2023-04-15 MED ORDER — LACTATED RINGERS IV SOLN: INTRAVENOUS | Status: DC

## 2023-04-15 MED ORDER — ACETAMINOPHEN 500 MG PO TABS
1000.0000 mg | ORAL_TABLET | Freq: Once | ORAL | Status: AC
  Administered 2023-04-15: 1000 mg via ORAL
  Filled 2023-04-15: qty 2

## 2023-04-15 MED ORDER — BUPIVACAINE-EPINEPHRINE 0.25% -1:200000 IJ SOLN
INTRAMUSCULAR | Status: AC
  Filled 2023-04-15: qty 1

## 2023-04-15 MED ORDER — CHLORHEXIDINE GLUCONATE 0.12 % MT SOLN
15.0000 mL | Freq: Once | OROMUCOSAL | Status: AC
  Administered 2023-04-15: 15 mL via OROMUCOSAL

## 2023-04-15 MED ORDER — ROCURONIUM BROMIDE 10 MG/ML (PF) SYRINGE
PREFILLED_SYRINGE | INTRAVENOUS | Status: AC
  Filled 2023-04-15: qty 10

## 2023-04-15 MED ORDER — PHENYLEPHRINE 80 MCG/ML (10ML) SYRINGE FOR IV PUSH (FOR BLOOD PRESSURE SUPPORT)
PREFILLED_SYRINGE | INTRAVENOUS | Status: AC
  Filled 2023-04-15: qty 10

## 2023-04-15 MED ORDER — LACTATED RINGERS IR SOLN
Status: DC | PRN
  Administered 2023-04-15: 1000 mL

## 2023-04-15 MED ORDER — 0.9 % SODIUM CHLORIDE (POUR BTL) OPTIME
TOPICAL | Status: DC | PRN
  Administered 2023-04-15: 1000 mL

## 2023-04-15 MED ORDER — TRAMADOL HCL 50 MG PO TABS
50.0000 mg | ORAL_TABLET | Freq: Four times a day (QID) | ORAL | Status: DC | PRN
  Administered 2023-04-15 – 2023-04-18 (×7): 100 mg via ORAL
  Filled 2023-04-15 (×7): qty 2

## 2023-04-15 MED ORDER — MAGIC MOUTHWASH: 15.0000 mL | Freq: Four times a day (QID) | ORAL | Status: DC | PRN

## 2023-04-15 MED ORDER — PROPOFOL 10 MG/ML IV BOLUS
INTRAVENOUS | Status: AC
  Filled 2023-04-15: qty 20

## 2023-04-15 MED ORDER — IRBESARTAN 75 MG PO TABS
75.0000 mg | ORAL_TABLET | Freq: Every day | ORAL | Status: DC
  Administered 2023-04-15: 75 mg via ORAL
  Filled 2023-04-15: qty 1

## 2023-04-15 MED ORDER — HYDROMORPHONE HCL 1 MG/ML IJ SOLN
INTRAMUSCULAR | Status: AC
  Filled 2023-04-15: qty 1

## 2023-04-15 MED ORDER — HYDROMORPHONE HCL 1 MG/ML IJ SOLN
INTRAMUSCULAR | Status: DC | PRN
  Administered 2023-04-15 (×2): 1 mg via INTRAVENOUS

## 2023-04-15 MED ORDER — GABAPENTIN 100 MG PO CAPS
200.0000 mg | ORAL_CAPSULE | Freq: Every day | ORAL | Status: DC
  Administered 2023-04-15 – 2023-04-16 (×2): 200 mg via ORAL
  Filled 2023-04-15 (×3): qty 2

## 2023-04-15 MED ORDER — ALBUTEROL SULFATE (2.5 MG/3ML) 0.083% IN NEBU: 2.5000 mg | INHALATION_SOLUTION | RESPIRATORY_TRACT | Status: DC | PRN

## 2023-04-15 MED ORDER — ACETAMINOPHEN 500 MG PO TABS: 1000.0000 mg | ORAL_TABLET | ORAL | Status: DC

## 2023-04-15 MED ORDER — EPHEDRINE 5 MG/ML INJ
INTRAVENOUS | Status: AC
  Filled 2023-04-15: qty 5

## 2023-04-15 MED ORDER — PHENYLEPHRINE 80 MCG/ML (10ML) SYRINGE FOR IV PUSH (FOR BLOOD PRESSURE SUPPORT)
PREFILLED_SYRINGE | INTRAVENOUS | Status: DC | PRN
  Administered 2023-04-15: 80 ug via INTRAVENOUS

## 2023-04-15 MED ORDER — METRONIDAZOLE 500 MG PO TABS
1000.0000 mg | ORAL_TABLET | ORAL | Status: DC
Start: 2023-04-15 — End: 2023-04-15
  Filled 2023-04-15: qty 2

## 2023-04-15 MED ORDER — SALINE SPRAY 0.65 % NA SOLN: 1.0000 | Freq: Four times a day (QID) | NASAL | Status: DC | PRN

## 2023-04-15 MED ORDER — PHENOL 1.4 % MT LIQD: 2.0000 | OROMUCOSAL | Status: DC | PRN

## 2023-04-15 MED ORDER — NITROGLYCERIN 0.4 MG SL SUBL: 0.4000 mg | SUBLINGUAL_TABLET | SUBLINGUAL | Status: DC | PRN

## 2023-04-15 MED ORDER — BUPIVACAINE LIPOSOME 1.3 % IJ SUSP
INTRAMUSCULAR | Status: DC | PRN
  Administered 2023-04-15: 20 mL

## 2023-04-15 MED ORDER — SODIUM CHLORIDE 0.9% FLUSH: 3.0000 mL | INTRAVENOUS | Status: DC | PRN

## 2023-04-15 MED ORDER — LACTATED RINGERS IV SOLN: 1000.0000 mL | Freq: Three times a day (TID) | INTRAVENOUS | Status: AC | PRN

## 2023-04-15 MED ORDER — ONDANSETRON HCL 4 MG/2ML IJ SOLN
INTRAMUSCULAR | Status: DC | PRN
  Administered 2023-04-15: 4 mg via INTRAVENOUS

## 2023-04-15 MED ORDER — DIPHENHYDRAMINE HCL 50 MG/ML IJ SOLN: 12.5000 mg | Freq: Four times a day (QID) | INTRAMUSCULAR | Status: DC | PRN

## 2023-04-15 MED ORDER — ORAL CARE MOUTH RINSE: 15.0000 mL | Freq: Once | OROMUCOSAL | Status: AC

## 2023-04-15 MED ORDER — GABAPENTIN 100 MG PO CAPS
200.0000 mg | ORAL_CAPSULE | ORAL | Status: AC
  Administered 2023-04-15: 200 mg via ORAL
  Filled 2023-04-15: qty 2

## 2023-04-15 MED ORDER — DIPHENHYDRAMINE HCL 12.5 MG/5ML PO ELIX: 12.5000 mg | ORAL_SOLUTION | Freq: Four times a day (QID) | ORAL | Status: DC | PRN

## 2023-04-15 MED ORDER — ONDANSETRON HCL 4 MG PO TABS: 4.0000 mg | ORAL_TABLET | Freq: Four times a day (QID) | ORAL | Status: DC | PRN

## 2023-04-15 MED ORDER — ALVIMOPAN 12 MG PO CAPS
12.0000 mg | ORAL_CAPSULE | ORAL | Status: AC
  Administered 2023-04-15: 12 mg via ORAL
  Filled 2023-04-15: qty 1

## 2023-04-15 MED ORDER — KCL IN DEXTROSE-NACL 20-5-0.45 MEQ/L-%-% IV SOLN
INTRAVENOUS | Status: AC
  Filled 2023-04-15: qty 1000

## 2023-04-15 MED ORDER — DEXAMETHASONE SODIUM PHOSPHATE 4 MG/ML IJ SOLN
INTRAMUSCULAR | Status: DC | PRN
Start: 2023-04-15 — End: 2023-04-15
  Administered 2023-04-15: 10 mg via INTRAVENOUS

## 2023-04-15 MED ORDER — PROCHLORPERAZINE MALEATE 10 MG PO TABS: 10.0000 mg | ORAL_TABLET | Freq: Four times a day (QID) | ORAL | Status: DC | PRN

## 2023-04-15 MED ORDER — FENTANYL CITRATE (PF) 250 MCG/5ML IJ SOLN
INTRAMUSCULAR | Status: AC
  Filled 2023-04-15: qty 5

## 2023-04-15 MED ORDER — SIMETHICONE 80 MG PO CHEW: 40.0000 mg | CHEWABLE_TABLET | Freq: Four times a day (QID) | ORAL | Status: DC | PRN

## 2023-04-15 MED ORDER — SODIUM CHLORIDE 0.9 % IV SOLN
1.0000 g | INTRAVENOUS | Status: AC
  Administered 2023-04-15: 1 g via INTRAVENOUS
  Filled 2023-04-15: qty 1000

## 2023-04-15 MED ORDER — MELATONIN 3 MG PO TABS: 3.0000 mg | ORAL_TABLET | Freq: Every evening | ORAL | Status: DC | PRN

## 2023-04-15 MED ORDER — POLYETHYLENE GLYCOL 3350 17 GM/SCOOP PO POWD
1.0000 | Freq: Once | ORAL | Status: DC
  Filled 2023-04-15: qty 255

## 2023-04-15 MED ORDER — LIDOCAINE HCL (PF) 2 % IJ SOLN
INTRAMUSCULAR | Status: DC | PRN
  Administered 2023-04-15: 1.5 mg/kg/h via INTRADERMAL

## 2023-04-15 MED ORDER — TAMSULOSIN HCL 0.4 MG PO CAPS
0.4000 mg | ORAL_CAPSULE | Freq: Every day | ORAL | Status: DC
  Administered 2023-04-16 – 2023-04-17 (×2): 0.4 mg via ORAL
  Filled 2023-04-15 (×3): qty 1

## 2023-04-15 MED ORDER — CALCIUM POLYCARBOPHIL 625 MG PO TABS
625.0000 mg | ORAL_TABLET | Freq: Two times a day (BID) | ORAL | Status: DC
  Administered 2023-04-15 – 2023-04-18 (×6): 625 mg via ORAL
  Filled 2023-04-15 (×6): qty 1

## 2023-04-15 MED ORDER — ENSURE SURGERY PO LIQD
237.0000 mL | Freq: Two times a day (BID) | ORAL | Status: DC
  Administered 2023-04-16 – 2023-04-17 (×3): 237 mL via ORAL

## 2023-04-15 MED ORDER — ENOXAPARIN SODIUM 40 MG/0.4ML IJ SOSY
40.0000 mg | PREFILLED_SYRINGE | INTRAMUSCULAR | Status: DC
Start: 2023-04-16 — End: 2023-04-17
  Administered 2023-04-16: 40 mg via SUBCUTANEOUS
  Filled 2023-04-15: qty 0.4

## 2023-04-15 MED ORDER — ROSUVASTATIN CALCIUM 20 MG PO TABS
20.0000 mg | ORAL_TABLET | Freq: Every day | ORAL | Status: DC
  Administered 2023-04-16 – 2023-04-17 (×2): 20 mg via ORAL
  Filled 2023-04-15 (×2): qty 1

## 2023-04-15 MED ORDER — SODIUM CHLORIDE 0.9 % IV SOLN: 250.0000 mL | INTRAVENOUS | Status: DC | PRN

## 2023-04-15 MED ORDER — ENSURE PRE-SURGERY PO LIQD
592.0000 mL | Freq: Once | ORAL | Status: DC
  Filled 2023-04-15: qty 592

## 2023-04-15 MED ORDER — PROCHLORPERAZINE EDISYLATE 10 MG/2ML IJ SOLN: 5.0000 mg | Freq: Four times a day (QID) | INTRAMUSCULAR | Status: DC | PRN

## 2023-04-15 MED ORDER — ALVIMOPAN 12 MG PO CAPS
12.0000 mg | ORAL_CAPSULE | Freq: Two times a day (BID) | ORAL | Status: DC
  Filled 2023-04-15: qty 1

## 2023-04-15 MED ORDER — TRAMADOL HCL 50 MG PO TABS
50.0000 mg | ORAL_TABLET | Freq: Four times a day (QID) | ORAL | 0 refills | Status: DC | PRN
  Filled 2023-04-15: qty 20, 5d supply, fill #0

## 2023-04-15 MED ORDER — NAPHAZOLINE-GLYCERIN 0.012-0.25 % OP SOLN: 1.0000 [drp] | Freq: Four times a day (QID) | OPHTHALMIC | Status: DC | PRN

## 2023-04-15 MED ORDER — SUGAMMADEX SODIUM 200 MG/2ML IV SOLN
INTRAVENOUS | Status: DC | PRN
  Administered 2023-04-15: 200 mg via INTRAVENOUS

## 2023-04-15 MED ORDER — BUPIVACAINE-EPINEPHRINE (PF) 0.25% -1:200000 IJ SOLN
INTRAMUSCULAR | Status: DC | PRN
  Administered 2023-04-15: 50 mL via PERINEURAL

## 2023-04-15 MED ORDER — STERILE WATER FOR IRRIGATION IR SOLN
Status: DC | PRN
  Administered 2023-04-15: 1000 mL

## 2023-04-15 MED ORDER — ACETAMINOPHEN 500 MG PO TABS
1000.0000 mg | ORAL_TABLET | Freq: Four times a day (QID) | ORAL | Status: DC
  Administered 2023-04-15 – 2023-04-18 (×10): 1000 mg via ORAL
  Filled 2023-04-15 (×10): qty 2

## 2023-04-15 MED ORDER — BUPIVACAINE LIPOSOME 1.3 % IJ SUSP
INTRAMUSCULAR | Status: AC
  Filled 2023-04-15: qty 20

## 2023-04-15 MED ORDER — HYDRALAZINE HCL 20 MG/ML IJ SOLN: 10.0000 mg | INTRAMUSCULAR | Status: DC | PRN

## 2023-04-15 MED ORDER — FENTANYL CITRATE (PF) 100 MCG/2ML IJ SOLN
INTRAMUSCULAR | Status: DC | PRN
  Administered 2023-04-15 (×2): 50 ug via INTRAVENOUS
  Administered 2023-04-15: 100 ug via INTRAVENOUS
  Administered 2023-04-15: 50 ug via INTRAVENOUS

## 2023-04-15 MED ORDER — HYDROMORPHONE HCL 1 MG/ML IJ SOLN
0.2500 mg | INTRAMUSCULAR | Status: DC | PRN
  Administered 2023-04-15: 0.5 mg via INTRAVENOUS
  Administered 2023-04-15 (×2): 0.25 mg via INTRAVENOUS

## 2023-04-15 MED ORDER — NEOMYCIN SULFATE 500 MG PO TABS
1000.0000 mg | ORAL_TABLET | ORAL | Status: DC
  Filled 2023-04-15: qty 2

## 2023-04-15 MED ORDER — PROPOFOL 10 MG/ML IV BOLUS
INTRAVENOUS | Status: DC | PRN
  Administered 2023-04-15: 30 mg via INTRAVENOUS
  Administered 2023-04-15: 170 mg via INTRAVENOUS

## 2023-04-15 MED ORDER — EPHEDRINE SULFATE-NACL 50-0.9 MG/10ML-% IV SOSY
PREFILLED_SYRINGE | INTRAVENOUS | Status: DC | PRN
  Administered 2023-04-15: 5 mg via INTRAVENOUS

## 2023-04-15 MED ORDER — BISACODYL 5 MG PO TBEC: 20.0000 mg | DELAYED_RELEASE_TABLET | Freq: Once | ORAL | Status: DC

## 2023-04-15 MED ORDER — MONTELUKAST SODIUM 10 MG PO TABS
10.0000 mg | ORAL_TABLET | Freq: Every day | ORAL | Status: DC
  Administered 2023-04-15 – 2023-04-17 (×3): 10 mg via ORAL
  Filled 2023-04-15 (×3): qty 1

## 2023-04-15 MED ORDER — HYDROMORPHONE HCL 1 MG/ML IJ SOLN
0.5000 mg | INTRAMUSCULAR | Status: DC | PRN
Start: 2023-04-15 — End: 2023-04-18
  Administered 2023-04-15 – 2023-04-16 (×5): 1 mg via INTRAVENOUS
  Filled 2023-04-15 (×6): qty 1

## 2023-04-15 SURGICAL SUPPLY — 91 items
APPLIER CLIP 5 13 M/L LIGAMAX5 (MISCELLANEOUS)
APPLIER CLIP ROT 10 11.4 M/L (STAPLE)
BAG COUNTER SPONGE SURGICOUNT (BAG) ×1 IMPLANT
BLADE EXTENDED COATED 6.5IN (ELECTRODE) IMPLANT
CANNULA REDUCER 12-8 DVNC XI (CANNULA) IMPLANT
CHLORAPREP W/TINT 26 (MISCELLANEOUS) IMPLANT
CLIP APPLIE 5 13 M/L LIGAMAX5 (MISCELLANEOUS) IMPLANT
CLIP APPLIE ROT 10 11.4 M/L (STAPLE) IMPLANT
COVER SURGICAL LIGHT HANDLE (MISCELLANEOUS) ×2 IMPLANT
COVER TIP SHEARS 8 DVNC (MISCELLANEOUS) ×1 IMPLANT
DEVICE TROCAR PUNCTURE CLOSURE (ENDOMECHANICALS) IMPLANT
DRAIN CHANNEL 19F RND (DRAIN) IMPLANT
DRAPE ARM DVNC X/XI (DISPOSABLE) ×4 IMPLANT
DRAPE COLUMN DVNC XI (DISPOSABLE) ×1 IMPLANT
DRAPE SURG IRRIG POUCH 19X23 (DRAPES) ×1 IMPLANT
DRIVER NDL LRG 8 DVNC XI (INSTRUMENTS) ×1 IMPLANT
DRIVER NDLE LRG 8 DVNC XI (INSTRUMENTS) ×1
DRSG OPSITE POSTOP 4X10 (GAUZE/BANDAGES/DRESSINGS) IMPLANT
DRSG OPSITE POSTOP 4X6 (GAUZE/BANDAGES/DRESSINGS) IMPLANT
DRSG OPSITE POSTOP 4X8 (GAUZE/BANDAGES/DRESSINGS) IMPLANT
DRSG TEGADERM 2-3/8X2-3/4 SM (GAUZE/BANDAGES/DRESSINGS) ×5 IMPLANT
DRSG TEGADERM 4X4.75 (GAUZE/BANDAGES/DRESSINGS) IMPLANT
ELECT PENCIL ROCKER SW 15FT (MISCELLANEOUS) ×1 IMPLANT
ELECT REM PT RETURN 15FT ADLT (MISCELLANEOUS) ×1 IMPLANT
ENDOLOOP SUT PDS II 0 18 (SUTURE) IMPLANT
EVACUATOR SILICONE 100CC (DRAIN) IMPLANT
GAUZE SPONGE 2X2 8PLY STRL LF (GAUZE/BANDAGES/DRESSINGS) ×1 IMPLANT
GLOVE ECLIPSE 8.0 STRL XLNG CF (GLOVE) ×3 IMPLANT
GLOVE INDICATOR 8.0 STRL GRN (GLOVE) ×3 IMPLANT
GOWN SRG XL LVL 4 BRTHBL STRL (GOWNS) ×1 IMPLANT
GOWN STRL REUS W/ TWL XL LVL3 (GOWN DISPOSABLE) ×4 IMPLANT
GRASPER SUT TROCAR 14GX15 (MISCELLANEOUS) IMPLANT
GRASPER TIP-UP FEN DVNC XI (INSTRUMENTS) ×1 IMPLANT
HOLDER FOLEY CATH W/STRAP (MISCELLANEOUS) ×1 IMPLANT
IRRIG SUCT STRYKERFLOW 2 WTIP (MISCELLANEOUS) ×1
IRRIGATION SUCT STRKRFLW 2 WTP (MISCELLANEOUS) ×1 IMPLANT
KIT PROCEDURE DVNC SI (MISCELLANEOUS) IMPLANT
KIT SIGMOIDOSCOPE (SET/KITS/TRAYS/PACK) IMPLANT
KIT TURNOVER KIT A (KITS) IMPLANT
NDL INSUFFLATION 14GA 120MM (NEEDLE) ×1 IMPLANT
NEEDLE INSUFFLATION 14GA 120MM (NEEDLE) ×1
PACK CARDIOVASCULAR III (CUSTOM PROCEDURE TRAY) ×1 IMPLANT
PACK COLON (CUSTOM PROCEDURE TRAY) ×1 IMPLANT
PAD POSITIONING PINK XL (MISCELLANEOUS) ×1 IMPLANT
PROTECTOR NERVE ULNAR (MISCELLANEOUS) ×2 IMPLANT
RELOAD STAPLE 45 3.5 BLU DVNC (STAPLE) IMPLANT
RELOAD STAPLE 45 4.3 GRN DVNC (STAPLE) IMPLANT
RELOAD STAPLE 60 2.5 WHT DVNC (STAPLE) IMPLANT
RELOAD STAPLE 60 3.5 BLU DVNC (STAPLE) IMPLANT
RELOAD STAPLE 60 4.3 GRN DVNC (STAPLE) IMPLANT
RETRACTOR WND ALEXIS 18 MED (MISCELLANEOUS) IMPLANT
RTRCTR WOUND ALEXIS 18CM MED (MISCELLANEOUS)
SCISSORS LAP 5X35 DISP (ENDOMECHANICALS) ×1 IMPLANT
SCISSORS MNPLR CVD DVNC XI (INSTRUMENTS) ×1 IMPLANT
SEAL UNIV 5-12 XI (MISCELLANEOUS) ×4 IMPLANT
SEALER VESSEL EXT DVNC XI (MISCELLANEOUS) ×1 IMPLANT
SOL ELECTROSURG ANTI STICK (MISCELLANEOUS) ×1
SOLUTION ELECTROSURG ANTI STCK (MISCELLANEOUS) ×1 IMPLANT
SPIKE FLUID TRANSFER (MISCELLANEOUS) ×1 IMPLANT
STAPLER 45 SUREFORM DVNC (STAPLE) IMPLANT
STAPLER 60 SUREFORM DVNC (STAPLE) IMPLANT
STAPLER ECHELON POWER CIR 29 (STAPLE) IMPLANT
STAPLER ECHELON POWER CIR 31 (STAPLE) IMPLANT
STAPLER RELOAD 2.5X60 WHT DVNC (STAPLE) ×1
STAPLER RELOAD 3.5X45 BLU DVNC (STAPLE)
STAPLER RELOAD 3.5X60 BLU DVNC (STAPLE) ×2
STAPLER RELOAD 4.3X45 GRN DVNC (STAPLE)
STAPLER RELOAD 4.3X60 GRN DVNC (STAPLE)
STOPCOCK 4 WAY LG BORE MALE ST (IV SETS) ×2 IMPLANT
SURGILUBE 2OZ TUBE FLIPTOP (MISCELLANEOUS) IMPLANT
SUT MNCRL AB 4-0 PS2 18 (SUTURE) ×1 IMPLANT
SUT PDS AB 1 CT1 27 (SUTURE) ×2 IMPLANT
SUT PROLENE 0 CT 2 (SUTURE) IMPLANT
SUT PROLENE 2 0 KS (SUTURE) IMPLANT
SUT PROLENE 2 0 SH DA (SUTURE) IMPLANT
SUT SILK 2 0 SH CR/8 (SUTURE) IMPLANT
SUT SILK 3 0 SH CR/8 (SUTURE) ×1 IMPLANT
SUT V-LOC BARB 180 2/0GR6 GS22 (SUTURE)
SUT VIC AB 3-0 SH 18 (SUTURE) IMPLANT
SUT VIC AB 3-0 SH 27XBRD (SUTURE) IMPLANT
SUT VICRYL 0 UR6 27IN ABS (SUTURE) IMPLANT
SUTURE V-LC BRB 180 2/0GR6GS22 (SUTURE) IMPLANT
SYR 20ML ECCENTRIC (SYRINGE) ×1 IMPLANT
SYS LAPSCP GELPORT 120MM (MISCELLANEOUS)
SYS WOUND ALEXIS 18CM MED (MISCELLANEOUS) ×1
SYSTEM LAPSCP GELPORT 120MM (MISCELLANEOUS) IMPLANT
SYSTEM WOUND ALEXIS 18CM MED (MISCELLANEOUS) ×1 IMPLANT
TRAY FOLEY MTR SLVR 16FR STAT (SET/KITS/TRAYS/PACK) ×1 IMPLANT
TROCAR ADV FIXATION 5X100MM (TROCAR) ×1 IMPLANT
TUBING CONNECTING 10 (TUBING) ×2 IMPLANT
TUBING INSUFFLATION 10FT LAP (TUBING) ×1 IMPLANT

## 2023-04-15 NOTE — Anesthesia Procedure Notes (Signed)
Procedure Name: Intubation Date/Time: 04/15/2023 11:50 AM  Performed by: [facility], CRNAPre-anesthesia Checklist: Patient identified, Emergency Drugs available, Suction available and Patient being monitored Patient Re-evaluated:Patient Re-evaluated prior to induction Oxygen Delivery Method: Circle system utilized Preoxygenation: Pre-oxygenation with 100% oxygen Induction Type: IV induction Ventilation: Mask ventilation without difficulty Laryngoscope Size: 2 and Miller Grade View: Grade I Tube type: Oral Tube size: 7.0 mm Number of attempts: 1 Airway Equipment and Method: Stylet Placement Confirmation: ETT inserted through vocal cords under direct vision, positive ETCO2 and breath sounds checked- equal and bilateral Secured at: 23 cm Tube secured with: Tape Dental Injury: Teeth and Oropharynx as per pre-operative assessment

## 2023-04-15 NOTE — Discharge Instructions (Signed)
SURGERY: POST OP INSTRUCTIONS (Surgery for small bowel obstruction, colon resection, etc)   ######################################################################  EAT Gradually transition to a high fiber diet with a fiber supplement over the next few days after discharge  WALK Walk an hour a day.  Control your pain to do that.    CONTROL PAIN Control pain so that you can walk, sleep, tolerate sneezing/coughing, go up/down stairs.  HAVE A BOWEL MOVEMENT DAILY Keep your bowels regular to avoid problems.  OK to try a laxative to override constipation.  OK to use an antidairrheal to slow down diarrhea.  Call if not better after 2 tries  CALL IF YOU HAVE PROBLEMS/CONCERNS Call if you are still struggling despite following these instructions. Call if you have concerns not answered by these instructions  ######################################################################   DIET Follow a light diet the first few days at home.  Start with a bland diet such as soups, liquids, starchy foods, low fat foods, etc.  If you feel full, bloated, or constipated, stay on a ful liquid or pureed/blenderized diet for a few days until you feel better and no longer constipated. Be sure to drink plenty of fluids every day to avoid getting dehydrated (feeling dizzy, not urinating, etc.). Gradually add a fiber supplement to your diet over the next week.  Gradually get back to a regular solid diet.  Avoid fast food or heavy meals the first week as you are more likely to get nauseated. It is expected for your digestive tract to need a few months to get back to normal.  It is common for your bowel movements and stools to be irregular.  You will have occasional bloating and cramping that should eventually fade away.  Until you are eating solid food normally, off all pain medications, and back to regular activities; your bowels will not be normal. Focus on eating a low-fat, high fiber diet the rest of your life  (See Getting to Good Bowel Health, below).  CARE of your INCISION or WOUND  It is good for closed incisions and even open wounds to be washed every day.  Shower every day.  Short baths are fine.  Wash the incisions and wounds clean with soap & water.    You may leave closed incisions open to air if it is dry.   You may cover the incision with clean gauze & replace it after your daily shower for comfort.  TEGADERM:  You have clear gauze band-aid dressings over your closed incision(s).  Remove the dressings 2 days after surgery = 2/15 Saturday.    If you have an open wound with a wound vac, see wound vac care instructions.    ACTIVITIES as tolerated Start light daily activities --- self-care, walking, climbing stairs-- beginning the day after surgery.  Gradually increase activities as tolerated.  Control your pain to be active.  Stop when you are tired.  Ideally, walk several times a day, eventually an hour a day.   Most people are back to most day-to-day activities in a few weeks.  It takes 4-8 weeks to get back to unrestricted, intense activity. If you can walk 30 minutes without difficulty, it is safe to try more intense activity such as jogging, treadmill, bicycling, low-impact aerobics, swimming, etc. Save the most intensive and strenuous activity for last (Usually 4-8 weeks after surgery) such as sit-ups, heavy lifting, contact sports, etc.  Refrain from any intense heavy lifting or straining until you are off narcotics for pain control.  You will have  off days, but things should improve week-by-week. DO NOT PUSH THROUGH PAIN.  Let pain be your guide: If it hurts to do something, don't do it.  Pain is your body warning you to avoid that activity for another week until the pain goes down. You may drive when you are no longer taking narcotic prescription pain medication, you can comfortably wear a seatbelt, and you can safely make sudden turns/stops to protect yourself without hesitating due  to pain. You may have sexual intercourse when it is comfortable. If it hurts to do something, stop.   MEDICATIONS Take your usually prescribed home medications unless otherwise directed.    Blood thinners:  You can restart any strong blood thinners after the second postoperative day  for example: COUMADIN (warfarin), XERELTO (rivaroxaban), ELIQUIS (apixaban), PLAVIX (clopidigrel), BRILINTA (ticagrelor), EFFIENT (prasugrel), PRADAXA (dabigatran), etc  Continue aspirin before & after surgery..     Some oozing/bleeding the first 1-2 weeks is common but should taper down & be small volume.    If you are passing many large clots or having uncontrolling bleeding, call your surgeon    PAIN CONTROL Pain after surgery or related to activity is often due to strain/injury to muscle, tendon, nerves and/or incisions.  This pain is usually short-term and will improve in a few months.  To help speed the process of healing and to get back to regular activity more quickly, DO THE FOLLOWING THINGS TOGETHER: Increase activity gradually.  DO NOT PUSH THROUGH PAIN Use Ice and/or Heat Try Gentle Massage and/or Stretching Take over the counter pain medication Take Narcotic prescription pain medication for more severe pain  Good pain control = faster recovery.  It is better to take more medicine to be more active than to stay in bed all day to avoid medications.  Increase activity gradually Avoid heavy lifting at first, then increase to lifting as tolerated over the next 6 weeks. Do not "push through" the pain.  Listen to your body and avoid positions and maneuvers than reproduce the pain.  Wait a few days before trying something more intense Walking an hour a day is encouraged to help your body recover faster and more safely.  Start slowly and stop when getting sore.  If you can walk 30 minutes without stopping or pain, you can try more intense activity (running, jogging, aerobics, cycling, swimming,  treadmill, sex, sports, weightlifting, etc.) Remember: If it hurts to do it, then don't do it! Use Ice and/or Heat You will have swelling and bruising around the incisions.  This will take several weeks to resolve. Ice packs or heating pads (6-8 times a day, 30-60 minutes at a time) will help sooth soreness & bruising. Some people prefer to use ice alone, heat alone, or alternate between ice & heat.  Experiment and see what works best for you.  Consider trying ice for the first few days to help decrease swelling and bruising; then, switch to heat to help relax sore spots and speed recovery. Shower every day.  Short baths are fine.  It feels good!  Keep the incisions and wounds clean with soap & water.   Try Gentle Massage and/or Stretching Massage at the area of pain many times a day Stop if you feel pain - do not overdo it Take over the counter pain medication This helps the muscle and nerve tissues become less irritable and calm down faster Choose ONE of the following over-the-counter anti-inflammatory medications: Acetaminophen 500mg  tabs (Tylenol) 1-2 pills with every  meal and just before bedtime (avoid if you have liver problems or if you have acetaminophen in you narcotic prescription) Naproxen 220mg  tabs (ex. Aleve, Naprosyn) 1-2 pills twice a day (avoid if you have kidney, stomach, IBD, or bleeding problems) Ibuprofen 200mg  tabs (ex. Advil, Motrin) 3-4 pills with every meal and just before bedtime (avoid if you have kidney, stomach, IBD, or bleeding problems) Take with food/snack several times a day as directed for at least 2 weeks to help keep pain / soreness down & more manageable. Take Narcotic prescription pain medication for more severe pain A prescription for strong pain control is often given to you upon discharge (for example: oxycodone/Percocet, hydrocodone/Norco/Vicodin, or tramadol/Ultram) Take your pain medication as prescribed. Be mindful that most narcotic prescriptions  contain Tylenol (acetaminophen) as well - avoid taking too much Tylenol. If you are having problems/concerns with the prescription medicine (does not control pain, nausea, vomiting, rash, itching, etc.), please call us [phone] to see if we need to switch you to a different pain medicine that will work better for you and/or control your side effects better. If you need a refill on your pain medication, you must call the office before 4 pm and on weekdays only.  By federal law, prescriptions for narcotics cannot be called into a pharmacy.  They must be filled out on paper & picked up from our office by the patient or authorized caretaker.  Prescriptions cannot be filled after 4 pm nor on weekends.    WHEN TO CALL us [phone] Severe uncontrolled or worsening pain  Fever over 101 F (38.5 C) Concerns with the incision: Worsening pain, redness, rash/hives, swelling, bleeding, or drainage Reactions / problems with new medications (itching, rash, hives, nausea, etc.) Nausea and/or vomiting Difficulty urinating Difficulty breathing Worsening fatigue, dizziness, lightheadedness, blurred vision Other concerns If you are not getting better after two weeks or are noticing you are getting worse, contact our office [phone] for further advice.  We may need to adjust your medications, re-evaluate you in the office, send you to the emergency room, or see what other things we can do to help. The clinic staff is available to answer your questions during regular business hours (8:30am-5pm).  Please don't hesitate to call and ask to speak to one of our nurses for clinical concerns.    A surgeon from [facility] is always on call at the hospitals 24 hours/day If you have a medical emergency, go to the nearest emergency room or call 911.  FOLLOW UP in our office One the day of your discharge from the hospital (or the next business weekday), please call [facility] to set up  or confirm an appointment to see your surgeon in the office for a follow-up appointment.  Usually it is 2-3 weeks after your surgery.   If you have skin staples at your incision(s), let the office know so we can set up a time in the office for the nurse to remove them (usually around 10 days after surgery). Make sure that you call for appointments the day of discharge (or the next business weekday) from the hospital to ensure a convenient appointment time. IF YOU HAVE DISABILITY OR FAMILY LEAVE FORMS, BRING THEM TO THE OFFICE FOR PROCESSING.  DO NOT GIVE THEM TO YOUR DOCTOR.  [facility] [address], [place], [place]  [address] ? [phone] - Main [phone] - Toll Free,  [phone] - Fax [email or web address]  GETTING TO GOOD BOWEL HEALTH. It is expected for your digestive tract to need a few months to get back to normal.  It is common for your bowel movements and stools to be irregular.  You will have occasional bloating and cramping that should eventually fade away.  Until you are eating solid food normally, off all pain medications, and back to regular activities; your bowels will not be normal.   Avoiding constipation The goal: ONE SOFT BOWEL MOVEMENT A DAY!    Drink plenty of fluids.  Choose water first. TAKE A FIBER SUPPLEMENT EVERY DAY THE REST OF YOUR LIFE During your first week back home, gradually add back a fiber supplement every day Experiment which form you can tolerate.   There are many forms such as powders, tablets, wafers, gummies, etc Psyllium bran (Metamucil), methylcellulose (Citrucel), Miralax or Glycolax, Benefiber, Flax Seed.  Adjust the dose week-by-week (1/2 dose/day to 6 doses a day) until you are moving your bowels 1-2 times a day.  Cut back the dose or try a different fiber product if it is giving you problems such as diarrhea or bloating. Sometimes a laxative is needed to help jump-start bowels if  constipated until the fiber supplement can help regulate your bowels.  If you are tolerating eating & you are farting, it is okay to try a gentle laxative such as double dose MiraLax, prune juice, or Milk of Magnesia.  Avoid using laxatives too often. Stool softeners can sometimes help counteract the constipating effects of narcotic pain medicines.  It can also cause diarrhea, so avoid using for too long. If you are still constipated despite taking fiber daily, eating solids, and a few doses of laxatives, call our office. Controlling diarrhea Try drinking liquids and eating bland foods for a few days to avoid stressing your intestines further. Avoid dairy products (especially milk & ice cream) for a short time.  The intestines often can lose the ability to digest lactose when stressed. Avoid foods that cause gassiness or bloating.  Typical foods include beans and other legumes, cabbage, broccoli, and dairy foods.  Avoid greasy, spicy, fast foods.  Every person has some sensitivity to other foods, so listen to your body and avoid those foods that trigger problems for you. Probiotics (such as active yogurt, Align, etc) may help repopulate the intestines and colon with normal bacteria and calm down a sensitive digestive tract Adding a fiber supplement gradually can help thicken stools by absorbing excess fluid and retrain the intestines to act more normally.  Slowly increase the dose over a few weeks.  Too much fiber too soon can backfire and cause cramping & bloating. It is okay to try and slow down diarrhea with a few doses of antidiarrheal medicines.   Bismuth subsalicylate (ex. Kayopectate, Pepto Bismol) for a few doses can help control diarrhea.  Avoid if pregnant.   Loperamide (Imodium) can slow down diarrhea.  Start with one tablet (2mg ) first.  Avoid if you are having fevers or severe pain.  ILEOSTOMY PATIENTS WILL HAVE CHRONIC DIARRHEA since their colon is not in use.    Drink plenty of liquids.   You will need to drink even more glasses of water/liquid a day to avoid getting dehydrated. Record output from your ileostomy.  Expect to empty the bag every 3-4 hours at first.  Most people with a permanent ileostomy empty their bag 4-6 times at the least.   Use antidiarrheal medicine (especially Imodium) several times a day to avoid getting dehydrated.  Start with a dose at bedtime & breakfast.  Adjust up or down as needed.  Increase antidiarrheal medications as directed to avoid emptying the bag more than 8 times a day (every 3 hours). Work with your wound ostomy nurse to learn care for your ostomy.  See ostomy care instructions. TROUBLESHOOTING IRREGULAR BOWELS 1) Start with a soft & bland diet. No spicy, greasy, or fried foods.  2) Avoid gluten/wheat or dairy products from diet to see if symptoms improve. 3) Miralax 17gm or flax seed mixed in 8oz. water or juice-daily. May use 2-4 times a day as needed. 4) Gas-X, Phazyme, etc. as needed for gas & bloating.  5) Prilosec (omeprazole) over-the-counter as needed 6)  Consider probiotics (Align, Activa, etc) to help calm the bowels down  Call your doctor if you are getting worse or not getting better.  Sometimes further testing (cultures, endoscopy, X-ray studies, CT scans, bloodwork, etc.) may be needed to help diagnose and treat the cause of the diarrhea. [facility] [address], [place], [place]  [address] [phone] - Main.    [phone]  - Toll Free.   [phone] - Fax [email or web address]

## 2023-04-15 NOTE — Plan of Care (Signed)
Problem: Education: Goal: Understanding of discharge needs will improve Outcome: Progressing   Problem: Activity: Goal: Ability to tolerate increased activity will improve Outcome: Progressing
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
## 2023-04-15 NOTE — Op Note (Signed)
04/15/2023  2:24 PM  PATIENT:  Willie Dominguez  73 y.o. male  Patient Care Team: Nelwyn Salisbury, MD as PCP - General (Family Medicine) Karie Soda, MD as Consulting Physician (General Surgery) Elder Negus, MD as Consulting Physician (Cardiology) Martina Sinner, MD as Consulting Physician (Pulmonary Disease) Mansouraty, Netty Starring., MD as Consulting Physician (Gastroenterology)  PRE-OPERATIVE DIAGNOSIS:   RECURRENT CECAL COLON POLYP UNRESECTABLE BY COLONOSCOPY INCARCERATED UMBILICAL HERNIA 3x2cm  POST-OPERATIVE DIAGNOSIS:  RECURRENT CECAL COLON POLYP UNRESECTABLE BY COLONOSCOPY INCARCERATED UMBILICAL HERNIA 3x2cm  PROCEDURE:   ROBOTIC PROXIMAL "RIGHT" COLECTOMY WITH ANASTOMOSIS PRIMARY UMBILICAL HERNIA REPAIR ROBOTIC LYSIS OF ADHESIONS X 45 MIN (40% CASE) TRANSVERSUS ABDOMINIS PLANE (TAP) BLOCK - BILATERAL  SURGEON:  Ardeth Sportsman, MD  ASSISTANT:  Romie Levee, MD  An experienced assistant was required given the standard of surgical care given the complexity of the case.  This assistant was needed for exposure, dissection, suction, tissue approximation, retraction, perception, etc  ANESTHESIA:  General endotracheal intubation anesthesia (GETA) and [facility] TRANSVERSUS ABDOMINIS PLANE (TAP) nerve block -BILATERAL for perioperative & postoperative pain control at the level of the transverse abdominis & preperitoneal spaces along the flank at the anterior axillary line, from subcostal ridge to iliac crest under laparoscopic guidance provided with liposomal bupivacaine (Experel) 20mL mixed with 50 mL of bupivicaine 0.25% with epinephrine  Estimated Blood Loss (EBL):   Total I/O In: 1700 [I.V.:1600; IV Piggyback:100] Out: 150 [Urine:100; Blood:50].   (See anesthesia record)  Delay start of Pharmacological VTE agent (>24hrs) due to concerns of significant anemia, surgical blood loss, or risk of bleeding?:  no  DRAINS: (None)  SPECIMEN:  Colon - proximal  right  DISPOSITION OF SPECIMEN:  Pathology  COUNTS:  Sponge, needle, & instrument counts CORRECT  PLAN OF CARE: Admit to inpatient   PATIENT DISPOSITION:  PACU - hemodynamically stable.  INDICATION:    Patient with history of colon polyps has had recurrent cecal polypoid mass.  Prior endoscopic resections and EMR resections with recurrence.  Given second recurrence, surgical counseling offered.  I recommended segmental resection:  The anatomy & physiology of the digestive tract was discussed.  The pathophysiology was discussed.  Natural history risks without surgery was discussed.   I worked to give an overview of the disease and the frequent need to have multispecialty involvement.  I feel the risks of no intervention will lead to serious problems that outweigh the operative risks; therefore, I recommended a partial colectomy to remove the pathology.  Laparoscopic & open techniques were discussed.   Risks such as bleeding, infection, abscess, leak, reoperation, possible ostomy, hernia, heart attack, death, and other risks were discussed.  I noted a good likelihood this will help address the problem.   Goals of post-operative recovery were discussed as well.  We will work to minimize complications.  An educational handout on the pathology was given as well.  Questions were answered.    The patient expresses understanding & wishes to proceed with surgery.  OR FINDINGS:   Patient had some thickening of the cecum.  No evidence of any perforation or metastasis or peritoneal disease.  Moderately dense omental adhesions in upper abdomen from prior right upper quadrant incision  No obvious metastatic disease on visceral parietal peritoneum or liver.  It is an isoperistaltic ileocolonic anastomosis that rests in the supraumbilcal region.  Umbilical hernia 3 x 2 cm incarcerated with omentum.  Reduction and primary repair done.  Very thinned out skin and chronic hernia sac  trim down to more healthy  skin and dermis.  CASE DATA: Type of patient?: Elective [facility] Private Case Status of Case? Elective Scheduled Infection Present At Time Of Surgery (PATOS)?  NO     DESCRIPTION:   Informed consent was confirmed.  The patient underwent general anaesthesia without difficulty.  The patient was positioned with arms tucked & secured appropriately.  VTE prevention in place.  The patient's abdomen was clipped, prepped, & draped in a sterile fashion.  Surgical timeout confirmed our plan.  The patient was positioned in reverse Trendelenburg.  Abdominal entry was gained using Varess technique at the left subcostal ridge on the anterior abdominal wall.  No elevated EtCO2 noted.  Port placed.  Camera inspection revealed no injury.  Extra ports were carefully placed under direct laparoscopic visualization.  We docked the [employer] Vinci robot carefully and placed intstruments under visualization  Omentum was densely adherent to the liver and anterior abdominal wall.  We kept that in situ on the liver initially.  I mobilized the small bowel into the left lower quadrant and pelvis.    I was able to elevate the proximal colon to isolate the ileocolonic pedicle.  I scored the ileal mesentery just proximal to that.   I carried that further dissection in a medial to lateral fashion.  I was able to bluntly get into the retro-mesenteric plane on the right side.  I freed the proximal right sided colonic mesentery off the retroperitoneum including the duodenal sweep, pancreatic head, & Gerota's fascia of the right kidney. I was able to get underneath the hepatic flexure.  I was able to get underneath the proximal and mid transverse colon.  I isolated the proximal ileocecal pedicle.  I skeletonized it & transected the vessels.    I then proceeded to mobilize the terminal ileum & proximal "right" colon in a lateral to medial fashion.   I mobilized the ascending colon off It is side wall attachments to the paracolic gutter and  retroperitoneum.  I also mobilized the greater omentum off the mid transverse colon and mobilized the mid to proximal transverse colon in a superior to inferior fashion.  This allowed me to mobilize the hepatic flexure and get a complete mobilization of the proximal "right" colon to the mid-transverse colon.  Still some persistent distally adhesions to the right lower quadrant and retroperitoneum near the pelvis.  We reposition and carefully freed those off to get better mobility of the distal ileum to reach into the supraumbilical region.  I mobilized the distal ileal mesentery off its retroperitoneal and pelvic attachments.  Had to free the greater omentum off the anterior abdominal wall as well as the liver going more distally in order to get the mid transverse colon to reach more the right upper quadrant so the anastomosis could come together without tension.  I could isolate the pathology.  Chose an area in the distal ileum and transected the mesentery radially chose an area in the transverse colon just proximal to a dominant middle colic artery pedicle and transected that in a radial fashion to good location.  We confirmed good viability of the ileum and transverse colon plan for anastomosis. When he went ahead and proceeded with transection.  We transected at the distal ileum with a robotic stapler 60mm blue load.  We then transected transverse colon with a robotic stapler 60mm blue load.  We confirmed hemostasis.     I did a side-to-side stapled anastomosis of ileum to mid-transverse colon using a  60mm blue load in an isoperistaltic fashion.  (Distal stump of ileum to mid transverse colon for the distal end of the anastomosis.  Proximal end of colon stump to more proximal ileum for the proximal end of the anastomosis).  I sewed the common staple channel wound with an absorbable suture (V-lock) in a running [place] fashion from each corner and meeting in the center.  We did meticulous inspection prove an  airtight closure.  I protected the anastomosis line with an anterior omentopexy of greater omentum using V lock suture.    We did reinspection of the abdomen.  Hemostasis was good.   Ureters, retroperitoneum, and bowel uninjured.  The anastomosis looked healthy.   Endoluminal gas was evacuated.  We placed the wound protector through the suprapubic 12mm port site after it was enlarged in a Pfannenstiel fashion.  Specimen removed without incident.   Ports & wound protector removed.  Hemostasis was good.  Sterile unused instruments were used from this point.  I closed the skin at the port sites using Monocryl stitch and sterile dressing.  I closed the extraction wound using a 0 Vicryl vertical peritoneal closure and a #1 PDS transverse anterior rectal fascial closure like a small Pfannenstiel closure.  Then focused on the umbilical hernia.  We had reduced the omentum out of it.  I came through the superior umbilical fold and freed out the thin skin off the hernia and to read out the hernia sac.  I primarily closed the hernia with #1 PDS interrupted fashion.  I then trimmed the skin transversely down since then it was very thinned out and somewhat ecchymotic until it healthier full or thick dermis.  I closed the skin with some interrupted Monocryl stitches.  I placed sterile dressings.     Patient is being extubated go to recovery room. I discussed postop care with the patient in detail the office & in the holding area. Instructions are written. I discussed operative findings, updated the patient's status, discussed probable steps to recovery, and gave postoperative recommendations to the patient's spouse, Lyla Son .  Recommendations were made.  Questions were answered.  She expressed understanding & appreciation.  Ardeth Sportsman, M.D., F.A.C.S. Gastrointestinal and Minimally Invasive Surgery [facility] [address] [place], [place] [address] [phone] Main / Paging

## 2023-04-15 NOTE — Anesthesia Postprocedure Evaluation (Signed)
Anesthesia Post Note  Patient: Willie Dominguez  Procedure(s) Performed: XI ROBOT ASSISTED RIGHT COLECTOMY PROXIMAL, LYSIS OF ADHESIONS, UMBILICAL HERNIA REPAIR     Patient location during evaluation: PACU Anesthesia Type: General Level of consciousness: awake and alert Pain management: pain level controlled Vital Signs Assessment: post-procedure vital signs reviewed and stable Respiratory status: spontaneous breathing, nonlabored ventilation and respiratory function stable Cardiovascular status: blood pressure returned to baseline Postop Assessment: no apparent nausea or vomiting Anesthetic complications: no   No notable events documented.  Last Vitals:  Vitals:   04/15/23 1530 04/15/23 1600  BP: (!) 151/87 (!) 143/82  Pulse: 80 83  Resp: 11 14  Temp:  36.4 C  SpO2: 96% 95%    Last Pain:  Vitals:   04/15/23 1600  TempSrc:   PainSc: Asleep                 Shanda Howells

## 2023-04-15 NOTE — Transfer of Care (Signed)
Immediate Anesthesia Transfer of Care Note  Patient: Willie Dominguez  Procedure(s) Performed: XI ROBOT ASSISTED RIGHT COLECTOMY PROXIMAL, LYSIS OF ADHESIONS, UMBILICAL HERNIA REPAIR  Patient Location: PACU  Anesthesia Type:General  Level of Consciousness: awake and patient cooperative  Airway & Oxygen Therapy: Patient Spontanous Breathing and Patient connected to face mask  Post-op Assessment: Report given to RN and Post -op Vital signs reviewed and stable  Post vital signs: Reviewed and stable  Last Vitals:  Vitals Value Taken Time  BP 173/97 04/15/23 1430  Temp    Pulse 75 04/15/23 1431  Resp 11 04/15/23 1432  SpO2 98 % 04/15/23 1431  Vitals shown include unfiled device data.  Last Pain:  Vitals:   04/15/23 0912  TempSrc:   PainSc: 0-No pain      Patients Stated Pain Goal: 4 (04/15/23 0912)  Complications: No notable events documented.
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
## 2023-04-16 ENCOUNTER — Other Ambulatory Visit (HOSPITAL_COMMUNITY): Payer: Self-pay

## 2023-04-16 DIAGNOSIS — I10 Essential (primary) hypertension: Secondary | ICD-10-CM

## 2023-04-16 DIAGNOSIS — Z01818 Encounter for other preprocedural examination: Secondary | ICD-10-CM | POA: Diagnosis not present

## 2023-04-16 DIAGNOSIS — K639 Disease of intestine, unspecified: Secondary | ICD-10-CM | POA: Diagnosis not present

## 2023-04-16 LAB — BASIC METABOLIC PANEL
Anion gap: 9 (ref 5–15)
BUN: 12 mg/dL (ref 8–23)
CO2: 21 mmol/L — ABNORMAL LOW (ref 22–32)
Calcium: 8.8 mg/dL — ABNORMAL LOW (ref 8.9–10.3)
Chloride: 102 mmol/L (ref 98–111)
Creatinine, Ser: 1.1 mg/dL (ref 0.61–1.24)
GFR, Estimated: >60 mL/min (ref >60)
Glucose, Bld: 144 mg/dL — ABNORMAL HIGH (ref 70–99)
Potassium: 4.9 mmol/L (ref 3.5–5.1)
Sodium: 132 mmol/L — ABNORMAL LOW (ref 135–145)

## 2023-04-16 LAB — CBC
HCT: 36.4 % — ABNORMAL LOW (ref 39.0–52.0)
Hemoglobin: 12.1 g/dL — ABNORMAL LOW (ref 13.0–17.0)
MCH: 32.3 pg (ref 26.0–34.0)
MCHC: 33.2 g/dL (ref 30.0–36.0)
MCV: 97.1 fL (ref 80.0–100.0)
Platelets: 116 10*3/uL — ABNORMAL LOW (ref 150–400)
RBC: 3.75 MIL/uL — ABNORMAL LOW (ref 4.22–5.81)
RDW: 13.6 % (ref 11.5–15.5)
WBC: 7.8 10*3/uL (ref 4.0–10.5)
nRBC: 0 % (ref 0.0–0.2)

## 2023-04-16 LAB — MAGNESIUM: Magnesium: 1.9 mg/dL (ref 1.7–2.4)

## 2023-04-16 MED ORDER — METHOCARBAMOL 1000 MG/10ML IJ SOLN
500.0000 mg | Freq: Four times a day (QID) | INTRAMUSCULAR | Status: DC | PRN
  Administered 2023-04-16 – 2023-04-17 (×4): 500 mg via INTRAVENOUS
  Filled 2023-04-16 (×4): qty 10

## 2023-04-16 MED ORDER — HYDRALAZINE HCL 20 MG/ML IJ SOLN: 10.0000 mg | INTRAMUSCULAR | Status: DC | PRN

## 2023-04-16 NOTE — Evaluation (Signed)
Physical Therapy Evaluation Patient Details Name: RODEL GLASPY MRN: [id number] DOB: [birth year] Today's Date: 04/16/2023  History of Present Illness  73 yo male presents to therapy following hospital admission on 04/15/2023 for surgical intervention to address recurrent cecal colon polyp and umbilical hernia due to DALM of colon and underwent colon resection. Pt PMH includes but is not limited to: R LE DVT, chronic dyspnea, HLD, HTN, anemia, PE, bradycardia, CAD s/p stents, R eye blind, thrombocytopenia, and peripheral neuropathy.  Clinical Impression     Pt admitted with above diagnosis.  Pt currently with functional limitations due to the deficits listed below (see PT Problem List). Pt seated in recliner when PT arrived pt agreeable to therapy eval. Pt indicated 5/10 abdominal pain and PT made nurse aware. Pt is S for transfer tasks and gait tasks no AD for 300 feet with min cues. No overt LOB and pt able to retreive item off floor no instability noted. Pt left seated in recliner, all needs in place and encouraged to amb with nursing or mobility specialist later in the day.  Pt will benefit from acute skilled PT to increase their independence and safety with mobility to allow discharge.         If plan is discharge home, recommend the following: Assistance with cooking/housework;Assist for transportation;Help with stairs or ramp for entrance   Can travel by private vehicle        Equipment Recommendations None recommended by PT  Recommendations for Other Services       Functional Status Assessment Patient has had a recent decline in their functional status and demonstrates the ability to make significant improvements in function in a reasonable and predictable amount of time.     Precautions / Restrictions Precautions Precautions: Fall Restrictions Weight Bearing Restrictions Per Provider Order: No      Mobility  Bed Mobility               General bed mobility  comments: pt seated in recliner when PT arrived    Transfers Overall transfer level: Needs assistance Equipment used: None Transfers: Sit to/from Stand Sit to Stand: Supervision           General transfer comment: min cues    Ambulation/Gait Ambulation/Gait assistance: Supervision Gait Distance (Feet): 300 Feet Assistive device: None Gait Pattern/deviations: Step-through pattern, Wide base of support Gait velocity: WFL     General Gait Details: no overt LOB noted no reported increased pain  Stairs            Wheelchair Mobility     Tilt Bed    Modified Rankin (Stroke Patients Only)       Balance Overall balance assessment: No apparent balance deficits (not formally assessed) (pt able to retrivie item from floor and no overt LOB)                                           Pertinent Vitals/Pain Pain Assessment Pain Assessment: 0-10 Pain Score: 5  Pain Location: abdomen Pain Descriptors / Indicators: Aching, Discomfort, Dull Pain Intervention(s): Limited activity within patient's tolerance, Monitored during session, Repositioned, Patient requesting pain meds-RN notified    Home Living Family/patient expects to be discharged to:: Private residence Living Arrangements: Spouse/significant other;Children Available Help at Discharge: Family;Available 24 hours/day Type of Home: House Home Access: Stairs to enter Entrance Stairs-Rails: Right Entrance Stairs-Number of Steps: 4  Home Layout: Two level;Full bath on main level;Able to live on main level with bedroom/bathroom Home Equipment: Shower seat - built in      Prior Function Prior Level of Function : Independent/Modified Independent;Driving                     Extremity/Trunk Assessment        Lower Extremity Assessment Lower Extremity Assessment: Overall WFL for tasks assessed    Cervical / Trunk Assessment Cervical / Trunk Assessment: Normal  Communication    Communication Communication: No apparent difficulties    Cognition Arousal: Alert Behavior During Therapy: WFL for tasks assessed/performed   PT - Cognitive impairments: No apparent impairments                         Following commands: Intact       Cueing       General Comments      Exercises     Assessment/Plan    PT Assessment Patient needs continued PT services  PT Problem List Decreased activity tolerance;Pain       PT Treatment Interventions Stair training;Gait training;Functional mobility training;Therapeutic activities;Therapeutic exercise;Balance training;Neuromuscular re-education;Patient/family education    PT Goals (Current goals can be found in the Care Plan section)  Acute Rehab PT Goals Patient Stated Goal: to be able to get back to yard work and gardening PT Goal Formulation: With patient Time For Goal Achievement: 04/30/23 Potential to Achieve Goals: Good    Frequency Min 1X/week     Co-evaluation               AM-PAC PT "6 Clicks" Mobility  Outcome Measure Help needed turning from your back to your side while in a flat bed without using bedrails?: A Little Help needed moving from lying on your back to sitting on the side of a flat bed without using bedrails?: A Little Help needed moving to and from a bed to a chair (including a wheelchair)?: A Little Help needed standing up from a chair using your arms (e.g., wheelchair or bedside chair)?: A Little Help needed to walk in hospital room?: A Little Help needed climbing 3-5 steps with a railing? : A Lot 6 Click Score: 17    End of Session Equipment Utilized During Treatment: Gait belt Activity Tolerance: Patient tolerated treatment well;No increased pain Patient left: in chair;with call bell/phone within reach Nurse Communication: Mobility status;Patient requests pain meds PT Visit Diagnosis: Difficulty in walking, not elsewhere classified (R26.2);Pain Pain - part of body:   (abdomen)    Time: 4098-1191 PT Time Calculation (min) (ACUTE ONLY): 9 min   Charges:       PT General Charges $$ ACUTE PT VISIT: 1 Visit         Johnny Bridge, PT Acute Rehab   Jacqualyn Posey 04/16/2023, 12:03 PM

## 2023-04-16 NOTE — Evaluation (Signed)
Occupational Therapy Evaluation Patient Details Name: Willie Dominguez MRN: [id number] DOB: 06-Jun-1950 Today's Date: 04/16/2023   History of Present Illness   73 yo male presents to therapy following hospital admission on 04/15/2023 for surgical intervention to address recurrent cecal colon polyp and umbilical hernia due to DALM of colon and underwent colon resection. Pt PMH includes but is not limited to: R LE DVT, chronic dyspnea, HLD, HTN, anemia, PE, bradycardia, CAD s/p stents, R eye blind, thrombocytopenia, and peripheral neuropathy.     Clinical Impressions Pt presents with decline in function and safety with ADLs and ADL mobility with impaired balance and endurance. PTA pt lives with his wife and was Ind with ADLs/selfcare, IADLs, home mgt and drives, used no AD for mobility. Pt currently requires CGA with LB ADLs and mobility. Pt would benefit from acute OT services to address impairments to maximize level of function and safety     If plan is discharge home, recommend the following:   A little help with bathing/dressing/bathroom;A little help with walking and/or transfers;Assist for transportation     Functional Status Assessment   Patient has had a recent decline in their functional status and demonstrates the ability to make significant improvements in function in a reasonable and predictable amount of time.     Equipment Recommendations   None recommended by OT     Recommendations for Other Services         Precautions/Restrictions   Precautions Precautions: Fall Restrictions Weight Bearing Restrictions Per Provider Order: No     Mobility Bed Mobility               General bed mobility comments: pt seated in recliner    Transfers Overall transfer level: Needs assistance Equipment used: None Transfers: Sit to/from Stand Sit to Stand: Supervision                  Balance Overall balance assessment: No apparent balance deficits (not  formally assessed)                                         ADL either performed or assessed with clinical judgement   ADL Overall ADL's : Needs assistance/impaired Eating/Feeding: Independent   Grooming: Wash/dry hands;Wash/dry face;Contact guard assist;Standing   Upper Body Bathing: Set up   Lower Body Bathing: Contact guard assist   Upper Body Dressing : Set up   Lower Body Dressing: Contact guard assist   Toilet Transfer: Supervision/safety;Ambulation   Toileting- Clothing Manipulation and Hygiene: Supervision/safety   Tub/ Shower Transfer: Contact guard assist   Functional mobility during ADLs: Supervision/safety       Vision Ability to See in Adequate Light: 0 Adequate Patient Visual Report: No change from baseline       Perception         Praxis         Pertinent Vitals/Pain Pain Assessment Pain Assessment: Faces Pain Score: 5  Pain Location: abdomen Pain Descriptors / Indicators: Discomfort Pain Intervention(s): Monitored during session, Repositioned     Extremity/Trunk Assessment     Lower Extremity Assessment Lower Extremity Assessment: Overall WFL for tasks assessed   Cervical / Trunk Assessment Cervical / Trunk Assessment: Normal   Communication Communication Communication: No apparent difficulties   Cognition Arousal: Alert Behavior During Therapy: WFL for tasks assessed/performed  Following commands: Intact       Cueing  General Comments          Exercises     Shoulder Instructions      Home Living Family/patient expects to be discharged to:: Private residence Living Arrangements: Spouse/significant other;Children Available Help at Discharge: Family;Available 24 hours/day Type of Home: House Home Access: Stairs to enter [employer] of Steps: 4 Entrance Stairs-Rails: Right Home Layout: Two level;Full bath on main level;Able to live on main level  with bedroom/bathroom     Bathroom Shower/Tub: Tub/shower unit;Walk-in shower         Home Equipment: Shower seat - built in          Prior Functioning/Environment Prior Level of Function : Independent/Modified Independent;Driving               ADLs Comments: Ind with ADLs/selfcare, home mgt, IADLs, drives    OT Problem List: Decreased activity tolerance;Pain;Impaired balance (sitting and/or standing)   OT Treatment/Interventions: Self-care/ADL training;DME and/or AE instruction;Therapeutic activities;Balance training;Patient/family education      OT Goals(Current goals can be found in the care plan section)   Acute Rehab OT Goals Patient Stated Goal: go home OT Goal Formulation: With patient Time For Goal Achievement: 04/30/23 Potential to Achieve Goals: Good ADL Goals Pt Will Perform Grooming: with supervision;with modified independence Pt Will Perform Lower Body Bathing: with supervision;with modified independence Pt Will Perform Lower Body Dressing: with supervision;with modified independence Pt Will Transfer to Toilet: with supervision;with modified independence Pt Will Perform Toileting - Clothing Manipulation and hygiene: with supervision;with modified independence   OT Frequency:  Min 1X/week    Co-evaluation              AM-PAC OT "6 Clicks" Daily Activity     Outcome Measure Help from another person eating meals?: None Help from another person taking care of personal grooming?: A Little Help from another person toileting, which includes using toliet, bedpan, or urinal?: A Little Help from another person bathing (including washing, rinsing, drying)?: A Little Help from another person to put on and taking off regular upper body clothing?: A Little Help from another person to put on and taking off regular lower body clothing?: A Little 6 Click Score: 19   End of Session Equipment Utilized During Treatment: Gait belt  Activity Tolerance:  Patient tolerated treatment well Patient left: in chair  OT Visit Diagnosis: Unsteadiness on feet (R26.81);Pain Pain - part of body:  (abdomen)                Time: 1610-9604 OT Time Calculation (min): 20 min Charges:  OT General Charges $OT Visit: 1 Visit OT Evaluation $OT Eval Low Complexity: 1 Low   Galen Manila 04/16/2023, 1:37 PM

## 2023-04-16 NOTE — Progress Notes (Signed)
04/16/2023  Willie Dominguez [id number] May 30, 1950  CARE TEAM: PCP: Nelwyn Salisbury, MD  Outpatient Care Team: Patient Care Team: Nelwyn Salisbury, MD as PCP - General (Family Medicine) Karie Soda, MD as Consulting Physician (General Surgery) Elder Negus, MD as Consulting Physician (Cardiology) Martina Sinner, MD as Consulting Physician (Pulmonary Disease) Mansouraty, Netty Starring., MD as Consulting Physician (Gastroenterology)  Inpatient Treatment Team: Treatment Team:  Karie Soda, MD Johnny Bridge, LPN Lilyan Gilford, MD [place], A, OT Lafe Garin, OT Jacqualyn Posey, PT [place], Leane Para, RPH Wright, [place] E, LPN Oval Linsey, RN   Problem List:   Principal Problem:   Dysplasia-associated lesion or mass (DALM) of colon Active Problems:   Dyslipidemia   Essential (primary) hypertension   BPH (benign prostatic hyperplasia)   History of colonic polyps   Long term (current) use of antithrombotics/antiplatelets   Class 1 obesity with body mass index (BMI) of 33.0 to 33.9 in adult   History of pulmonary embolus (PE)   Umbilical hernia without obstruction and without gangrene   Colonic mass   04/15/2023  POST-OPERATIVE DIAGNOSIS:  RECURRENT CECAL COLON POLYP UNRESECTABLE BY COLONOSCOPY INCARCERATED UMBILICAL HERNIA 3x2cm   PROCEDURE:   ROBOTIC PROXIMAL "RIGHT" COLECTOMY WITH ANASTOMOSIS PRIMARY UMBILICAL HERNIA REPAIR ROBOTIC LYSIS OF ADHESIONS X 45 MIN (40% CASE) TRANSVERSUS ABDOMINIS PLANE (TAP) BLOCK - BILATERAL   SURGEON:  Ardeth Sportsman, MD  OR FINDINGS:   Patient had some thickening of the cecum.  No evidence of any perforation or metastasis or peritoneal disease.  Moderately dense omental adhesions in upper abdomen from prior right upper quadrant incision   No obvious metastatic disease on visceral parietal peritoneum or liver.   It is an isoperistaltic ileocolonic anastomosis that rests in the supraumbilcal region.    Umbilical hernia 3 x 2 cm incarcerated with omentum.  Reduction and primary repair done.  Very thinned out skin and chronic hernia sac trim down to more healthy skin and dermis.   CASE DATA: Type of patient?: Elective [facility] Private Case Status of Case? Elective Scheduled Infection Present At Time Of Surgery (PATOS)?  NO         Assessment [facility] Stay = 1 days) 1 Day Post-Op    Recovering    Plan:  ERAS protocol  IV fluids Medlock.  IV fluid boluses as needed.  Tolerating liquids.  Gradually advance diet.  Remove Foley catheter postop day 1 per protocol.  Multimodal pain control.  Blood pressure control.  History of pulm embolism on chronic anticoagulation.  Enoxaparin first 48 hours.  If hemoglobin stable, restart Xarelto postop day 2 = tomorrow 2/15  H/o COPD - stable on room air  [facility] medicine consultation given numerous health issues.  Help appreciated.  -VTE prophylaxis- SCDs, etc  -mobilize as tolerated to help recovery.  With him being somewhat deconditioned, will ask therapies to evaluate to see if patient would benefit from home health or need for SNF.  -Disposition:  Disposition:  The patient is from: Home Anticipate discharge to:  Home with Home Health Anticipated Date of Discharge is:  February 15,2025   Barriers to discharge:  Therapy assessment & Recommendations pending and Pending Clinical improvement (more likely than not)  Patient currently is NOT MEDICALLY STABLE for discharge from the hospital from a surgery standpoint.      I reviewed nursing notes, last 24 h vitals and pain scores, last 48 h intake and output, last 24 h labs and trends, and  last 24 h imaging results.  I have reviewed this patient's available data, including medical history, events of note, test results, etc as part of my evaluation.   A significant portion of that time was spent in counseling. Care during the described time interval was provided by me.  This care  required moderate level of medical decision making.  04/16/2023    Subjective: (Chief complaint)  No major events.  Had some loose bowel movements.  Denies much pain or soreness.  Walked in hallways last night.  Objective:  Vital signs:  Vitals:   04/15/23 2039 04/16/23 0203 04/16/23 0531 04/16/23 0532  BP: (!) 142/81 116/81  131/83  Pulse: (!) 105 (!) 58  76  Resp: 18 18  18   Temp: 97.9 F (36.6 C) (!) 97.5 F (36.4 C)  98.1 F (36.7 C)  TempSrc: Oral Oral  Oral  SpO2: 91% 97%  96%  Weight:   106.5 kg   Height:        Last BM Date : 04/15/23  Intake/Output   Yesterday:  02/13 0701 - 02/14 0700 In: 2569.1 [P.O.:810; I.V.:1659.1; IV Piggyback:100] Out: 1950 [Urine:1900; Blood:50] This shift:  No intake/output data recorded.  Bowel function:  Flatus: No  BM:  YES  Drain: (No drain)   Physical Exam:  General: Pt awake/alert in no acute distress Eyes: PERRL, normal EOM.  Sclera clear.  No icterus Neuro: CN II-XII intact w/o focal sensory/motor deficits. Lymph: No head/neck/groin lymphadenopathy Psych:  No delerium/psychosis/paranoia.  Oriented x 4 HENT: Normocephalic, Mucus membranes moist.  No thrush Neck: Supple, No tracheal deviation.  No obvious thyromegaly Chest: No pain to chest wall compression.  Good respiratory excursion.  No audible wheezing CV:  Pulses intact.  Regular rhythm.  No major extremity edema MS: Normal AROM mjr joints.  No obvious deformity  Abdomen: Soft.  Mildy distended.  Mildly tender at incisions only.  No evidence of peritonitis.  No incarcerated hernias.  Ext:  No deformity.  No mjr edema.  No cyanosis Skin: No petechiae / purpurea.  No major sores.  Warm and dry    Results:   Cultures: No results found for this or any previous visit (from the past 720 hours).  Labs: Results for orders placed or performed during the hospital encounter of 04/15/23 (from the past 48 hours)  ABO/Rh     Status: None   Collection Time:  04/15/23  9:25 AM  Result Value Ref Range   ABO/RH(D)      A NEG Performed at [facility], [address], [place], [place] [address]   Basic metabolic panel     Status: Abnormal   Collection Time: 04/16/23  4:59 AM  Result Value Ref Range   Sodium 132 (L) 135 - 145 mmol/L   Potassium 4.9 3.5 - 5.1 mmol/L   Chloride 102 98 - 111 mmol/L   CO2 21 (L) 22 - 32 mmol/L   Glucose, Bld 144 (H) 70 - 99 mg/dL    Comment: Glucose reference range applies only to samples taken after fasting for at least 8 hours.   BUN 12 8 - 23 mg/dL   Creatinine, Ser 6.04 0.61 - 1.24 mg/dL   Calcium 8.8 (L) 8.9 - 10.3 mg/dL   GFR, Estimated >54 >09 mL/min    Comment: (NOTE) Calculated using the CKD-EPI Creatinine Equation (2021)    Anion gap 9 5 - 15    Comment: Performed at [facility], [address]  [place], [place] [address]  CBC     Status: Abnormal   Collection Time: 04/16/23  4:59 AM  Result Value Ref Range   WBC 7.8 4.0 - 10.5 K/uL   RBC 3.75 (L) 4.22 - 5.81 MIL/uL   Hemoglobin 12.1 (L) 13.0 - 17.0 g/dL   HCT 60.4 (L) 54.0 - 98.1 %   MCV 97.1 80.0 - 100.0 fL   MCH 32.3 26.0 - 34.0 pg   MCHC 33.2 30.0 - 36.0 g/dL   RDW 19.1 47.8 - 29.5 %   Platelets 116 (L) 150 - 400 K/uL   nRBC 0.0 0.0 - 0.2 %    Comment: Performed at [facility], [address] [place], [place] [address]  Magnesium     Status: None   Collection Time: 04/16/23  4:59 AM  Result Value Ref Range   Magnesium 1.9 1.7 - 2.4 mg/dL    Comment: Performed at [facility], [address], [place], [place] [address]    Imaging / Studies: No results found.  Medications / Allergies: per chart  Antibiotics: Anti-infectives (From admission, onward)    Start     Dose/Rate Route Frequency Ordered Stop   04/15/23 1400  neomycin (MYCIFRADIN) tablet 1,000 mg  Status:  Discontinued       Placed in "And" Linked Group   1,000 mg Oral 3 times per day 04/15/23  0851 04/15/23 1640   04/15/23 1400  metroNIDAZOLE (FLAGYL) tablet 1,000 mg  Status:  Discontinued       Placed in "And" Linked Group   1,000 mg Oral 3 times per day 04/15/23 0851 04/15/23 1640   04/15/23 0900  ertapenem (INVANZ) 1 g in sodium chloride 0.9 % 100 mL IVPB        1 g 200 mL/hr over 30 Minutes Intravenous On call to O.R. 04/15/23 0851 04/15/23 1201         Note: Portions of this report may have been transcribed using voice recognition software. Every effort was made to ensure accuracy; however, inadvertent computerized transcription errors may be present.   Any transcriptional errors that result from this process are unintentional.    Ardeth Sportsman, MD, FACS, MASCRS Esophageal, Gastrointestinal & Colorectal Surgery Robotic and Minimally Invasive Surgery  [facility] [address], [address] [place], [place] [address] [phone] Fax [phone] Main  CONTACT INFORMATION: Weekday (9AM-5PM): Call [facility] main office at [phone] Weeknight (5PM-9AM) or Weekend/Holiday: Check EPIC "Web Links" tab & use "AMION" (password " [email or web address]") for General Surgery [facility] coverage  Please, DO NOT use SecureChat  (it is not reliable communication to reach operating surgeons & will lead to a delay in care).   Epic staff messaging available for outptient concerns needing 1-2 business day response.      04/16/2023  7:43 AM

## 2023-04-16 NOTE — Progress Notes (Signed)
Mobility Specialist - Progress Note   04/16/23 1437  Mobility  Activity Ambulated independently in hallway  Level of Assistance Independent  Assistive Device None  Distance Ambulated (ft) 350 ft  Range of Motion/Exercises Active  Activity Response Tolerated well  Mobility Referral Yes  Mobility visit 1 Mobility  Mobility Specialist Start Time (ACUTE ONLY) 1427  Mobility Specialist Stop Time (ACUTE ONLY) 1437  Mobility Specialist Time Calculation (min) (ACUTE ONLY) 10 min   Pt was found in bed and agreeable to ambulate. No complaints with session. At EOS returned to bed with all needs met. Call bell in reach and wife in room.  Billey Chang Mobility Specialist

## 2023-04-16 NOTE — Progress Notes (Signed)
[facility] PROGRESS NOTE    Progress Note  Willie Dominguez  WUJ:[id number] DOB: [birth year] DOA: 04/15/2023 PCP: Nelwyn Salisbury, MD     Brief Narrative:   Willie Dominguez is an 73 y.o. male past medical history of chronic thrombocytopenia, chronic right lower extremity DVT on Xarelto, chronic dyspnea admitted for elective colon resection with primary anastomosis by surgery for recurrent polyp of the cecum, we are being consulted for medical management.  Patient was seen postoperatively by admitted   Assessment/Plan:   He is status post right colectomy with primary anastomosis and a umbilical hernia repair due to  Dysplasia-associated lesion or mass (DALM) of colon/ Colonic mass Further management per surgery. Continue full liquid diet narcotics for analgesics.  History of chronic right lower extremity DVT: Xarelto was held 3 days prior to admission. Surgery to dictate when to start antirelation.  Continue Lovenox for DVT prophylaxis.  Chronic dyspnea: New pulmonary toileting, with incentive parameter and flutter valve and inhalers as needed for cough or shortness of breath.  Hyperlipidemia:   continue Crestor.  Essential hypertension: Creatinine remained at baseline and is able to tolerate orals Can resume antihypertensive medication.  For now hold Avapro. Use hydralazine IV as needed for systolic blood pressure greater than 160. Basic metabolic panels pending this morning. Hold diuretic therapy.  Normocytic anemia: Noted and follow-up.  Mild thrombocytopenia: Now improving postsurgical procedure.  BPH: Continue Flomax.  Peripheral neuropathy: Noted    DVT prophylaxis: lovenox Family Communication:none Status is: Inpatient Remains inpatient appropriate because: Per surgery    Code Status:     Code Status Orders  (From admission, onward)           Start     Ordered   04/15/23 1701  Full code  Continuous       Question:  By:  Answer:   Consent: discussion documented in EHR   04/15/23 1700           Code Status History     Date Active Date Inactive Code Status Order ID Comments User Context   11/07/2021 2111 11/10/2021 2001 Full Code [id number]  Briscoe Deutscher, MD Inpatient   10/03/2019 1530 10/04/2019 0119 Full Code [id number]  Elder Negus, MD Inpatient   04/18/2019 1158 04/18/2019 2223 Full Code [id number]  Elder Negus, MD Inpatient   02/16/2018 2031 02/18/2018 1529 Full Code [id number]  Charlsie Quest, MD ED      Advance Directive Documentation    Flowsheet Row Most Recent Value  Type of Advance Directive Healthcare Power of Attorney, Living will  Pre-existing out of facility DNR order (yellow form or pink MOST form) --  "MOST" Form in Place? --         IV Access:   Peripheral IV   Procedures and diagnostic studies:   No results found.   Medical Consultants:   None.   Subjective:    Willie Dominguez denies any abdominal pain having bowel movements complaining of some abdominal distention  Objective:    Vitals:   04/15/23 2039 04/16/23 0203 04/16/23 0531 04/16/23 0532  BP: (!) 142/81 116/81  131/83  Pulse: (!) 105 (!) 58  76  Resp: 18 18  18   Temp: 97.9 F (36.6 C) (!) 97.5 F (36.4 C)  98.1 F (36.7 C)  TempSrc: Oral Oral  Oral  SpO2: 91% 97%  96%  Weight:   106.5 kg   Height:       SpO2: 96 % O2  Flow Rate (L/min): 0 L/min   Intake/Output Summary (Last 24 hours) at 04/16/2023 0707 Last data filed at 04/16/2023 0200 Gross per 24 hour  Intake 2449.11 ml  Output 1500 ml  Net 949.11 ml   Filed Weights   04/15/23 0912 04/16/23 0531  Weight: 105.7 kg 106.5 kg    Exam: General exam: In no acute distress. Respiratory system: Good air movement and clear to auscultation. Cardiovascular system: S1 & S2 heard, RRR. No JVD. Gastrointestinal system: Positive bowel sounds soft mildly distended no guarding Extremities: No pedal edema. Skin: No rashes, lesions or  ulcers Psychiatry: Judgement and insight appear normal. Mood & affect appropriate.    Data Reviewed:    Labs: Basic Metabolic Panel: No results for input(s): "NA", "K", "CL", "CO2", "GLUCOSE", "BUN", "CREATININE", "CALCIUM", "MG", "PHOS" in the last 168 hours. GFR Estimated Creatinine Clearance: 72 mL/min (by C-G formula based on SCr of 1.17 mg/dL). Liver Function Tests: No results for input(s): "AST", "ALT", "ALKPHOS", "BILITOT", "PROT", "ALBUMIN" in the last 168 hours. No results for input(s): "LIPASE", "AMYLASE" in the last 168 hours. No results for input(s): "AMMONIA" in the last 168 hours. Coagulation profile No results for input(s): "INR", "PROTIME" in the last 168 hours. COVID-19 Labs  No results for input(s): "DDIMER", "FERRITIN", "LDH", "CRP" in the last 72 hours.  Lab Results  Component Value Date   SARSCOV2NAA POSITIVE (A) 03/19/2021   SARSCOV2NAA NEGATIVE 02/26/2020   SARSCOV2NAA NEGATIVE 09/29/2019   SARSCOV2NAA NEGATIVE 04/14/2019    CBC: Recent Labs  Lab 04/16/23 0459  WBC 7.8  HGB 12.1*  HCT 36.4*  MCV 97.1  PLT 116*   Cardiac Enzymes: No results for input(s): "CKTOTAL", "CKMB", "CKMBINDEX", "TROPONINI" in the last 168 hours. BNP (last 3 results) No results for input(s): "PROBNP" in the last 8760 hours. CBG: No results for input(s): "GLUCAP" in the last 168 hours. D-Dimer: No results for input(s): "DDIMER" in the last 72 hours. Hgb A1c: No results for input(s): "HGBA1C" in the last 72 hours. Lipid Profile: No results for input(s): "CHOL", "HDL", "LDLCALC", "TRIG", "CHOLHDL", "LDLDIRECT" in the last 72 hours. Thyroid function studies: No results for input(s): "TSH", "T4TOTAL", "T3FREE", "THYROIDAB" in the last 72 hours.  Invalid input(s): "FREET3" Anemia work up: No results for input(s): "VITAMINB12", "FOLATE", "FERRITIN", "TIBC", "IRON", "RETICCTPCT" in the last 72 hours. Sepsis Labs: Recent Labs  Lab 04/16/23 0459  WBC 7.8    Microbiology No results found for this or any previous visit (from the past 240 hours).   Medications:    acetaminophen  1,000 mg Oral Q6H   alvimopan  12 mg Oral BID   enoxaparin (LOVENOX) injection  40 mg Subcutaneous Q24H   feeding supplement  237 mL Oral BID BM   gabapentin  200 mg Oral QHS   irbesartan  75 mg Oral Daily   montelukast  10 mg Oral QHS   polycarbophil  625 mg Oral BID   rosuvastatin  20 mg Oral Q1200   sodium chloride flush  3 mL Intravenous Q12H   tamsulosin  0.4 mg Oral QHS   Continuous Infusions:  sodium chloride     lactated ringers        LOS: 1 day   Marinda Elk  [facility]  04/16/2023, 7:07 AM

## 2023-04-16 NOTE — Progress Notes (Signed)
Per patients spouse, patient is to only have Xarelto for anticoagulant therapy when restarted,  patient is allergic to alternate form. Plan of care ongoing.

## 2023-04-16 NOTE — Progress Notes (Signed)
   04/16/23 1446  TOC Brief Assessment  Insurance and Status Reviewed  Patient has primary care physician Yes  Home environment has been reviewed home wtih spouse  Prior level of function: independent  Prior/Current Home Services No current home services  Social Drivers of Health Review SDOH reviewed no interventions necessary  Readmission risk has been reviewed Yes  Transition of care needs no transition of care needs at this time

## 2023-04-17 DIAGNOSIS — K639 Disease of intestine, unspecified: Secondary | ICD-10-CM | POA: Diagnosis not present

## 2023-04-17 LAB — POTASSIUM: Potassium: 3.8 mmol/L (ref 3.5–5.1)

## 2023-04-17 LAB — CBC
HCT: 33.8 % — ABNORMAL LOW (ref 39.0–52.0)
Hemoglobin: 10.9 g/dL — ABNORMAL LOW (ref 13.0–17.0)
MCH: 32.2 pg (ref 26.0–34.0)
MCHC: 32.2 g/dL (ref 30.0–36.0)
MCV: 100 fL (ref 80.0–100.0)
Platelets: 100 10*3/uL — ABNORMAL LOW (ref 150–400)
RBC: 3.38 MIL/uL — ABNORMAL LOW (ref 4.22–5.81)
RDW: 14 % (ref 11.5–15.5)
WBC: 6.5 10*3/uL (ref 4.0–10.5)
nRBC: 0 % (ref 0.0–0.2)

## 2023-04-17 LAB — HEMOGLOBIN: Hemoglobin: 11.2 g/dL — ABNORMAL LOW (ref 13.0–17.0)

## 2023-04-17 LAB — CREATININE, SERUM
Creatinine, Ser: 0.93 mg/dL (ref 0.61–1.24)
GFR, Estimated: >60 mL/min (ref >60)

## 2023-04-17 MED ORDER — RIVAROXABAN 10 MG PO TABS
20.0000 mg | ORAL_TABLET | Freq: Every day | ORAL | Status: DC
  Administered 2023-04-17: 20 mg via ORAL
  Filled 2023-04-17: qty 2

## 2023-04-17 NOTE — Progress Notes (Signed)
[facility] PROGRESS NOTE    Progress Note  Willie Dominguez  WUJ:[id number] DOB: 03-10-1950 DOA: 04/15/2023 PCP: Nelwyn Salisbury, MD     Brief Narrative:   Willie Dominguez is an 73 y.o. male past medical history of chronic thrombocytopenia, chronic right lower extremity DVT on Xarelto, chronic dyspnea admitted for elective colon resection with primary anastomosis by surgery for recurrent polyp of the cecum, we are being consulted for medical management.  Patient was seen postoperatively by admitted   Assessment/Plan:   He is status post right colectomy with primary anastomosis and a umbilical hernia repair due to  Dysplasia-associated lesion or mass (DALM) of colon/ Colonic mass Further management per surgery. Continue full liquid diet narcotics for analgesics.  History of chronic right lower extremity DVT: Xarelto was held 3 days prior to admission. Mild drop in hemoglobin on enoxaparin. Will discuss with surgery.  Can probably hold on Xarelto until tomorrow.  Chronic dyspnea: New pulmonary toileting, with incentive parameter and flutter valve and inhalers as needed for cough or shortness of breath.  Hyperlipidemia:   continue Crestor.  Essential hypertension: Creatinine remained at baseline and is able to tolerate orals Continue to hold all antihypertensive medication, including diuretic therapy. Blood pressure is well-controlled. Use hydralazine IV as needed for systolic blood pressure greater than 160. Creatinine 0.3.  Normocytic anemia: Mild drop in hemoglobin currently on Lovenox for DVT prophylaxis. Continue to hold Xarelto for today, discussed with surgery.  Mild thrombocytopenia: Now improving postsurgical procedure. Check a CBC tomorrow.  BPH: Continue Flomax.  Peripheral neuropathy: Noted    DVT prophylaxis: lovenox Family Communication:none Status is: Inpatient Remains inpatient appropriate because: Per surgery    Code Status:      Code Status Orders  (From admission, onward)           Start     Ordered   04/15/23 1701  Full code  Continuous       Question:  By:  Answer:  Consent: discussion documented in EHR   04/15/23 1700           Code Status History     Date Active Date Inactive Code Status Order ID Comments User Context   11/07/2021 2111 11/10/2021 2001 Full Code [id number]  Briscoe Deutscher, MD Inpatient   10/03/2019 1530 10/04/2019 0119 Full Code [id number]  Elder Negus, MD Inpatient   04/18/2019 1158 04/18/2019 2223 Full Code [id number]  Elder Negus, MD Inpatient   02/16/2018 2031 02/18/2018 1529 Full Code [id number]  Charlsie Quest, MD ED      Advance Directive Documentation    Flowsheet Row Most Recent Value  Type of Advance Directive Healthcare Power of Attorney, Living will  Pre-existing out of facility DNR order (yellow form or pink MOST form) --  "MOST" Form in Place? --         IV Access:   Peripheral IV   Procedures and diagnostic studies:   No results found.   Medical Consultants:   None.   Subjective:    Willie Dominguez abdominal pain is improved had a bowel movement abdominal distention is resolved.  Objective:    Vitals:   04/16/23 1332 04/16/23 2140 04/17/23 0500 04/17/23 0529  BP: 120/72 130/75  122/69  Pulse: (!) 55 (!) 58  (!) 58  Resp: 18 15  15   Temp: 98 F (36.7 C) 98.1 F (36.7 C)  98 F (36.7 C)  TempSrc: Oral Oral  Oral  SpO2: 100% 97%  94%  Weight:   107.7 kg   Height:       SpO2: 94 % O2 Flow Rate (L/min): 0 L/min   Intake/Output Summary (Last 24 hours) at 04/17/2023 0739 Last data filed at 04/17/2023 0600 Gross per 24 hour  Intake 590 ml  Output 401 ml  Net 189 ml   Filed Weights   04/15/23 0912 04/16/23 0531 04/17/23 0500  Weight: 105.7 kg 106.5 kg 107.7 kg    Exam: General exam: In no acute distress. Respiratory system: Good air movement and clear to auscultation. Cardiovascular system: S1 & S2 heard, RRR. No  JVD. Gastrointestinal system: Bowel sounds, abdominal distention improved soft nontender Extremities: No pedal edema. Skin: No rashes, lesions or ulcers Psychiatry: Judgement and insight appear normal. Mood & affect appropriate.  Data Reviewed:    Labs: Basic Metabolic Panel: Recent Labs  Lab 04/16/23 0459 04/17/23 0511  NA 132*  --   K 4.9 3.8  CL 102  --   CO2 21*  --   GLUCOSE 144*  --   BUN 12  --   CREATININE 1.10 0.93  CALCIUM 8.8*  --   MG 1.9  --    GFR Estimated Creatinine Clearance: 91 mL/min (by C-G formula based on SCr of 0.93 mg/dL). Liver Function Tests: No results for input(s): "AST", "ALT", "ALKPHOS", "BILITOT", "PROT", "ALBUMIN" in the last 168 hours. No results for input(s): "LIPASE", "AMYLASE" in the last 168 hours. No results for input(s): "AMMONIA" in the last 168 hours. Coagulation profile No results for input(s): "INR", "PROTIME" in the last 168 hours. COVID-19 Labs  No results for input(s): "DDIMER", "FERRITIN", "LDH", "CRP" in the last 72 hours.  Lab Results  Component Value Date   SARSCOV2NAA POSITIVE (A) 03/19/2021   SARSCOV2NAA NEGATIVE 02/26/2020   SARSCOV2NAA NEGATIVE 09/29/2019   SARSCOV2NAA NEGATIVE 04/14/2019    CBC: Recent Labs  Lab 04/16/23 0459 04/17/23 0511  WBC 7.8  --   HGB 12.1* 11.2*  HCT 36.4*  --   MCV 97.1  --   PLT 116*  --    Cardiac Enzymes: No results for input(s): "CKTOTAL", "CKMB", "CKMBINDEX", "TROPONINI" in the last 168 hours. BNP (last 3 results) No results for input(s): "PROBNP" in the last 8760 hours. CBG: No results for input(s): "GLUCAP" in the last 168 hours. D-Dimer: No results for input(s): "DDIMER" in the last 72 hours. Hgb A1c: No results for input(s): "HGBA1C" in the last 72 hours. Lipid Profile: No results for input(s): "CHOL", "HDL", "LDLCALC", "TRIG", "CHOLHDL", "LDLDIRECT" in the last 72 hours. Thyroid function studies: No results for input(s): "TSH", "T4TOTAL", "T3FREE",  "THYROIDAB" in the last 72 hours.  Invalid input(s): "FREET3" Anemia work up: No results for input(s): "VITAMINB12", "FOLATE", "FERRITIN", "TIBC", "IRON", "RETICCTPCT" in the last 72 hours. Sepsis Labs: Recent Labs  Lab 04/16/23 0459  WBC 7.8   Microbiology No results found for this or any previous visit (from the past 240 hours).   Medications:    acetaminophen  1,000 mg Oral Q6H   alvimopan  12 mg Oral BID   enoxaparin (LOVENOX) injection  40 mg Subcutaneous Q24H   feeding supplement  237 mL Oral BID BM   gabapentin  200 mg Oral QHS   montelukast  10 mg Oral QHS   polycarbophil  625 mg Oral BID   rosuvastatin  20 mg Oral Q1200   sodium chloride flush  3 mL Intravenous Q12H   tamsulosin  0.4 mg Oral QHS   Continuous Infusions:  sodium chloride        LOS: 2 days   Marinda Elk  [facility]  04/17/2023, 7:39 AM

## 2023-04-17 NOTE — Progress Notes (Signed)
2 Days Post-Op   Subjective/Chief Complaint: Had some pain control issues overnight, but improved this morning Had some diarrhea last night Hgb stable   Objective: Vital signs in last 24 hours: Temp:  [97.4 F (36.3 C)-98.1 F (36.7 C)] 98 F (36.7 C) (02/15 0529) Pulse Rate:  [54-58] 58 (02/15 0529) Resp:  [15-18] 15 (02/15 0529) BP: (120-130)/(69-75) 122/69 (02/15 0529) SpO2:  [94 %-100 %] 94 % (02/15 0529) Weight:  [107.7 kg] 107.7 kg (02/15 0500) Last BM Date : 04/16/23  Intake/Output from previous day: 02/14 0701 - 02/15 0700 In: 590 [P.O.:590] Out: 401 [Urine:400; Stool:1] Intake/Output this shift: No intake/output data recorded.  WDWN in NAD Abd - soft, incisional tenderness;  Incisions c/d/i  Lab Results:  Recent Labs    04/16/23 0459 04/17/23 0511  WBC 7.8  --   HGB 12.1* 11.2*  HCT 36.4*  --   PLT 116*  --    BMET Recent Labs    04/16/23 0459 04/17/23 0511  NA 132*  --   K 4.9 3.8  CL 102  --   CO2 21*  --   GLUCOSE 144*  --   BUN 12  --   CREATININE 1.10 0.93  CALCIUM 8.8*  --     Anti-infectives: Anti-infectives (From admission, onward)    Start     Dose/Rate Route Frequency Ordered Stop   04/15/23 1400  neomycin (MYCIFRADIN) tablet 1,000 mg  Status:  Discontinued       Placed in "And" Linked Group   1,000 mg Oral 3 times per day 04/15/23 0851 04/15/23 1640   04/15/23 1400  metroNIDAZOLE (FLAGYL) tablet 1,000 mg  Status:  Discontinued       Placed in "And" Linked Group   1,000 mg Oral 3 times per day 04/15/23 0851 04/15/23 1640   04/15/23 0900  ertapenem (INVANZ) 1 g in sodium chloride 0.9 % 100 mL IVPB        1 g 200 mL/hr over 30 Minutes Intravenous On call to O.R. 04/15/23 0851 04/15/23 1201       Assessment/Plan: s/p Procedure(s): XI ROBOT ASSISTED RIGHT COLECTOMY PROXIMAL, LYSIS OF ADHESIONS, UMBILICAL HERNIA REPAIR (N/A) Restart Xarelto Encourage ambulation Discussed pain control regimen Probable discharge 2/16  LOS: 2  days    Willie Dominguez 04/17/2023

## 2023-04-17 NOTE — Progress Notes (Signed)
Pt alerted nurse both wife and daughter tested positive for strep throat. Pt concerned he should be tested as well.  Discussed common symptoms of strep. Pt denies any symptoms at present. Encouraged pt to alert nurse if this should change.

## 2023-04-17 NOTE — Progress Notes (Signed)
Mobility Specialist - Progress Note   04/17/23 0850  Mobility  Activity Ambulated independently in hallway  Level of Assistance Independent  Assistive Device None  Distance Ambulated (ft) 500 ft  Activity Response Tolerated well  Mobility Referral Yes  Mobility visit 1 Mobility  Mobility Specialist Start Time (ACUTE ONLY) 0844  Mobility Specialist Stop Time (ACUTE ONLY) 0850  Mobility Specialist Time Calculation (min) (ACUTE ONLY) 6 min   Pt received in recliner and agreeable to mobility. No complaints during session. Pt to recliner after session with all needs met.    [facility]

## 2023-04-18 LAB — CREATININE, SERUM
Creatinine, Ser: 1 mg/dL (ref 0.61–1.24)
GFR, Estimated: >60 mL/min (ref >60)

## 2023-04-18 LAB — HEMOGLOBIN: Hemoglobin: 10.5 g/dL — ABNORMAL LOW (ref 13.0–17.0)

## 2023-04-18 LAB — POTASSIUM: Potassium: 4 mmol/L (ref 3.5–5.1)

## 2023-04-18 MED ORDER — TRAMADOL HCL 50 MG PO TABS
50.0000 mg | ORAL_TABLET | Freq: Four times a day (QID) | ORAL | 0 refills | Status: DC | PRN
Start: 2023-04-18 — End: 2023-09-14

## 2023-04-18 NOTE — TOC Transition Note (Signed)
Transition of Care [facility]) - Discharge Note   Patient Details  Name: Willie Dominguez MRN: [id number] Date of Birth: [birth year]  Transition of Care [facility]) CM/SW Contact:  Adrian Prows, RN Phone Number: 04/18/2023, 8:52 AM   Clinical Narrative:    Orders received for HHPT; spoke w/ pt in room; pt declined service; no TOC needs.   Final next level of care: Home/Self Care Barriers to Discharge: No Barriers Identified   Patient Goals and CMS Choice            Discharge Placement                       Discharge Plan and Services Additional resources added to the After Visit Summary for                  DME Arranged: N/A DME Agency: NA       HH Arranged: Patient Refused HH HH Agency: NA        Social Drivers of Health (SDOH) Interventions SDOH Screenings   Food Insecurity: Patient Declined (04/15/2023)  Housing: Patient Declined (04/15/2023)  Transportation Needs: Patient Declined (04/15/2023)  Utilities: Patient Declined (04/15/2023)  Alcohol Screen: Low Risk  (02/16/2023)  Depression (PHQ2-9): Low Risk  (07/31/2022)  Financial Resource Strain: Low Risk  (02/16/2023)  Physical Activity: Sufficiently Active (02/16/2023)  Social Connections: Unknown (04/15/2023)  Recent Concern: Social Connections - Moderately Isolated (02/16/2023)  Stress: No Stress Concern Present (02/16/2023)  Tobacco Use: Low Risk  (04/15/2023)     Readmission Risk Interventions    04/16/2023    2:46 PM  Readmission Risk Prevention Plan  Post Dischage Appt Complete  Medication Screening Complete  Transportation Screening Complete

## 2023-04-18 NOTE — Discharge Summary (Signed)
Physician Discharge Summary  Patient ID: Willie Dominguez MRN: [id number] DOB/AGE: [birth year] 73 y.o.  Admit date: 04/15/2023 Discharge date: 04/18/2023  Admission Diagnoses:  Cecal polyp    Umbilical hernia  Discharge Diagnoses: same Principal Problem:   Dysplasia-associated lesion or mass (DALM) of colon Active Problems:   Dyslipidemia   Essential (primary) hypertension   BPH (benign prostatic hyperplasia)   History of colonic polyps   Long term (current) use of antithrombotics/antiplatelets   Class 1 obesity with body mass index (BMI) of 33.0 to 33.9 in adult   History of pulmonary embolus (PE)   Umbilical hernia without obstruction and without gangrene   Colonic mass   Discharged Condition: good  Hospital Course: Robotic right hemicolectomy with anastomosis 04/15/23.  He regained bowel function on POD #2.  Pain under control.  Restarted on anticoagulation on POD #2.  Consults:  Hospitalists - to help manage chronic medical issues    Treatments: surgery: see above  Discharge Exam: Blood pressure (!) 142/74, pulse (!) 59, temperature 98 F (36.7 C), temperature source Oral, resp. rate 20, height 6' (1.829 m), weight 107 kg, SpO2 97%.   WDWN in NAD Abd - soft, incisional tenderness;  Incisions c/d/i Some ecchymosis around left periumbilical incision.  Disposition: Discharge disposition: 01-Home or Self Care       Discharge Instructions     Call MD for:   Complete by: As directed    FEVER > 101.5 F  (temperatures < 101.5 F are not significant)   Call MD for:  extreme fatigue   Complete by: As directed    Call MD for:  persistant dizziness or light-headedness   Complete by: As directed    Call MD for:  persistant nausea and vomiting   Complete by: As directed    Call MD for:  persistant nausea and vomiting   Complete by: As directed    Call MD for:  redness, tenderness, or signs of infection (pain, swelling, redness, odor or green/yellow discharge around  incision site)   Complete by: As directed    Call MD for:  redness, tenderness, or signs of infection (pain, swelling, redness, odor or green/yellow discharge around incision site)   Complete by: As directed    Call MD for:  severe uncontrolled pain   Complete by: As directed    Call MD for:  severe uncontrolled pain   Complete by: As directed    Call MD for:  temperature >100.4   Complete by: As directed    Diet - low sodium heart healthy   Complete by: As directed    Start with a bland diet such as soups, liquids, starchy foods, low fat foods, etc. the first few days at home. Gradually advance to a solid, low-fat, high fiber diet by the end of the first week at home.   Add a fiber supplement to your diet (Metamucil, etc) If you feel full, bloated, or constipated, stay on a full liquid or pureed/blenderized diet for a few days until you feel better and are no longer constipated.   Diet general   Complete by: As directed    Discharge instructions   Complete by: As directed    See Discharge Instructions If you are not getting better after two weeks or are noticing you are getting worse, contact our office [phone] for further advice.  We may need to adjust your medications, re-evaluate you in the office, send you to the emergency room, or see what other things  we can do to help. The clinic staff is available to answer your questions during regular business hours (8:30am-5pm).  Please don't hesitate to call and ask to speak to one of our nurses for clinical concerns.    A surgeon from [facility] is always on call at the hospitals 24 hours/day If you have a medical emergency, go to the nearest emergency room or call 911.   Discharge wound care:   Complete by: As directed    It is good for closed incisions and even open wounds to be washed every day.  Shower every day.  Short baths are fine.  Wash the incisions and wounds clean with soap & water.    You may leave closed  incisions open to air if it is dry.   You may cover the incision with clean gauze & replace it after your daily shower for comfort.  TEGADERM:  You have clear gauze band-aid dressings over your closed incision(s).  Remove the dressings 2 days after surgery = Saturday 2/15   Driving Restrictions   Complete by: As directed    You may drive when: - you are no longer taking narcotic prescription pain medication - you can comfortably wear a seatbelt - you can safely make sudden turns/stops without pain.   Driving Restrictions   Complete by: As directed    Do not drive while taking pain medications   Increase activity slowly   Complete by: As directed    Start light daily activities --- self-care, walking, climbing stairs- beginning the day after surgery.  Gradually increase activities as tolerated.  Control your pain to be active.  Stop when you are tired.  Ideally, walk several times a day, eventually an hour a day.   Most people are back to most day-to-day activities in a few weeks.  It takes 4-6 weeks to get back to unrestricted, intense activity. If you can walk 30 minutes without difficulty, it is safe to try more intense activity such as jogging, treadmill, bicycling, low-impact aerobics, swimming, etc. Save the most intensive and strenuous activity for last (Usually 4-8 weeks after surgery) such as sit-ups, heavy lifting, contact sports, etc.  Refrain from any intense heavy lifting or straining until you are off narcotics for pain control.  You will have off days, but things should improve week-by-week. DO NOT PUSH THROUGH PAIN.  Let pain be your guide: If it hurts to do something, don't do it.   Increase activity slowly   Complete by: As directed    Lifting restrictions   Complete by: As directed    If you can walk 30 minutes without difficulty, it is safe to try more intense activity such as jogging, treadmill, bicycling, low-impact aerobics, swimming, etc. Save the most intensive and  strenuous activity for last (Usually 4-8 weeks after surgery) such as sit-ups, heavy lifting, contact sports, etc.   Refrain from any intense heavy lifting or straining until you are off narcotics for pain control.  You will have off days, but things should improve week-by-week. DO NOT PUSH THROUGH PAIN.  Let pain be your guide: If it hurts to do something, don't do it.  Pain is your body warning you to avoid that activity for another week until the pain goes down.   May shower / Bathe   Complete by: As directed    May shower / Bathe   Complete by: As directed    May walk up steps   Complete by: As directed  Remove dressing in 48 hours   Complete by: As directed    Make sure all dressings have been removed on the second day after surgery = 2/15 Saturday Leave incisions open to air.  OK to cover incisions with gauze or bandages as desired   Sexual Activity Restrictions   Complete by: As directed    You may have sexual intercourse when it is comfortable. If it hurts to do something, stop.      Allergies as of 04/18/2023       Reactions   Ampicillin Anaphylaxis   Rash and tongue swelling.  Ampicillin given at dentist office about 50 years ago.   Methotrexate Derivatives Other (See Comments)   Mouth ulcers   Cardura [doxazosin] Other (See Comments)   Aggravated prostate Oliguria Bladder pain and irritation Urinary hesitancy   Lipitor [atorvastatin] Rash   Penicillins Rash   DID THE REACTION INVOLVE: Swelling of the face/tongue/throat, SOB, or low BP? Y Sudden or severe rash/hives, skin peeling, or the inside of the mouth or nose? Y Did it require medical treatment? N When did it last happen?  1970 If all above answers are "NO", may proceed with cephalosporin use.   Zestril [lisinopril] Other (See Comments)   Depression Fatigue   Bystolic [nebivolol Hcl] Rash   Zocor [simvastatin] Rash        Medication List     TAKE these medications    acetaminophen 500 MG  tablet Commonly known as: TYLENOL Take 1,500 mg by mouth daily as needed for fever, headache or moderate pain.   COQ10 PO Take 100 mg by mouth daily at 12 noon. midday   FISH OIL OMEGA-3 PO Take 1,000 mg by mouth daily at 12 noon. midday   furosemide 20 MG tablet Commonly known as: LASIX TAKE 1 TABLET BY MOUTH EVERY DAY What changed:  when to take this additional instructions   montelukast 10 MG tablet Commonly known as: SINGULAIR TAKE 1 TABLET BY MOUTH EVERYDAY AT BEDTIME   nitroGLYCERIN 0.4 MG SL tablet Commonly known as: NITROSTAT Place 1 tablet (0.4 mg total) under the tongue every 5 (five) minutes as needed for chest pain.   rosuvastatin 20 MG tablet Commonly known as: CRESTOR TAKE 1 TABLET BY MOUTH EVERY DAY What changed:  when to take this additional instructions   tamsulosin 0.4 MG Caps capsule Commonly known as: FLOMAX Take 0.4 mg by mouth at bedtime.   traMADol 50 MG tablet Commonly known as: ULTRAM Take 1-2 tablets (50-100 mg total) by mouth every 6 (six) hours as needed for moderate pain (pain score 4-6) or severe pain (pain score 7-10).   valsartan 80 MG tablet Commonly known as: DIOVAN TAKE 1 TABLET BY MOUTH EVERY DAY What changed:  when to take this additional instructions   VITAMIN B-12 PO Take 3,000 mcg by mouth daily at 12 noon. midday   VITAMIN D-3 PO Take 5,000 Units by mouth daily at 12 noon. midday   Xarelto 20 MG Tabs tablet Generic drug: rivaroxaban TAKE 1 TABLET BY MOUTH DAILY WITH SUPPER What changed:  when to take this additional instructions               Discharge Care Instructions  (From admission, onward)           Start     Ordered   04/15/23 0000  Discharge wound care:       Comments: It is good for closed incisions and even open wounds to be washed every  day.  Shower every day.  Short baths are fine.  Wash the incisions and wounds clean with soap & water.    You may leave closed incisions open to air if it  is dry.   You may cover the incision with clean gauze & replace it after your daily shower for comfort.  TEGADERM:  You have clear gauze band-aid dressings over your closed incision(s).  Remove the dressings 2 days after surgery = Saturday 2/15   04/15/23 1136            Follow-up Information     Karie Soda, MD Follow up on 05/05/2023.   Specialties: General Surgery, Colon and Rectal Surgery Contact information: [address] [place] [address] [phone]                 Signed: Wynona Luna 04/18/2023, 7:59 AM

## 2023-04-19 ENCOUNTER — Encounter: Payer: 59 | Admitting: Family Medicine

## 2023-04-19 ENCOUNTER — Other Ambulatory Visit (HOSPITAL_COMMUNITY): Payer: Self-pay

## 2023-04-19 LAB — SURGICAL PATHOLOGY

## 2023-04-20 ENCOUNTER — Ambulatory Visit (HOSPITAL_COMMUNITY): Payer: 59

## 2023-04-22 ENCOUNTER — Ambulatory Visit (HOSPITAL_COMMUNITY): Payer: 59

## 2023-04-26 ENCOUNTER — Encounter: Payer: Self-pay | Admitting: Family Medicine

## 2023-04-26 ENCOUNTER — Ambulatory Visit: Payer: 59 | Admitting: Family Medicine

## 2023-04-26 ENCOUNTER — Ambulatory Visit (INDEPENDENT_AMBULATORY_CARE_PROVIDER_SITE_OTHER): Payer: 59 | Admitting: Family Medicine

## 2023-04-26 ENCOUNTER — Other Ambulatory Visit (HOSPITAL_COMMUNITY): Payer: Self-pay

## 2023-04-26 VITALS — BP 102/64 | HR 69 | Temp 98.5°F | Ht 72.0 in | Wt 229.0 lb

## 2023-04-26 DIAGNOSIS — I10 Essential (primary) hypertension: Secondary | ICD-10-CM

## 2023-04-26 DIAGNOSIS — R0609 Other forms of dyspnea: Secondary | ICD-10-CM

## 2023-04-26 DIAGNOSIS — N400 Enlarged prostate without lower urinary tract symptoms: Secondary | ICD-10-CM

## 2023-04-26 DIAGNOSIS — I8291 Chronic embolism and thrombosis of unspecified vein: Secondary | ICD-10-CM

## 2023-04-26 DIAGNOSIS — I25118 Atherosclerotic heart disease of native coronary artery with other forms of angina pectoris: Secondary | ICD-10-CM

## 2023-04-26 DIAGNOSIS — Z86711 Personal history of pulmonary embolism: Secondary | ICD-10-CM

## 2023-04-26 DIAGNOSIS — R1030 Lower abdominal pain, unspecified: Secondary | ICD-10-CM | POA: Diagnosis not present

## 2023-04-26 DIAGNOSIS — J3089 Other allergic rhinitis: Secondary | ICD-10-CM

## 2023-04-26 NOTE — Progress Notes (Signed)
   Subjective:    Patient ID: Willie Dominguez, male    DOB: May 24, 1950, 73 y.o.   MRN: [id number]  HPI Here for an intro visit to be established with [place] after transferring from Dr. [place]. He is recovering from surgery on 04-15-23 per Dr. Michaell Cowing for a right colectomy, lysis of adhesions, and repair of an umbilical hernia. This went well. He is still sore but is feeling better. His appetite is returning, and his bowels are moving normally. He sees Dr. Truett Mainland for cardiology are and Dr. Francine Graven for pulmonology care. He has CAD with several stents in place. He had an ECHO on 03-16-23 showing an EF of 50 % with global hypokinesis of the LV and Grade I diastolic dysfunction. He had a high resolution chest CT on 11-17-22 showing his lungs to be clear other than some atelectasis. He had PFT last year showing a mild restrictive defect of the lungs. Annette Stable says he feels fine when resting or walking, but when lifting something heavy or going up steps he gets a little SOB. There is no chest pain. He has a hx of PE's so he is on Xarelto for life. He has passed kidney stones. He was put on Flomax for this, and he asks if he can stop this. He had also been taking Singulair for supposed allergies, but he has no allergy issues now and he wants to stop this. He also asks me to check some lesions on his back that itch sometimes.    Review of Systems  Constitutional: Negative.   Respiratory:  Positive for shortness of breath. Negative for cough and wheezing.   Cardiovascular: Negative.   Gastrointestinal: Negative.   Genitourinary: Negative.        Objective:   Physical Exam Constitutional:      Appearance: Normal appearance. He is not ill-appearing.  Cardiovascular:     Rate and Rhythm: Normal rate and regular rhythm.     Pulses: Normal pulses.     Heart sounds: Normal heart sounds.  Pulmonary:     Effort: Pulmonary effort is normal.     Breath sounds: Normal breath sounds.  Abdominal:     General:  Abdomen is flat. Bowel sounds are normal.     Palpations: There is no mass.     Tenderness: There is no guarding or rebound.     Hernia: No hernia is present.     Comments: He is mildly tender in the lower abdomen, and there are extensive ecchymoses   Skin:    Comments: He has a number of seborrheic keratoses on the back   Neurological:     Mental Status: He is alert.           Assessment & Plan:  Intro visit for this patient with CAD, slight CHF, and a restrictive lung defect. He is recovering well from recent abdominal surgery. The lesions on his back are benign, and I suggested he apply a moisturizing lotion as needed. His allergies are stable so he will stop the Singulair. His kidney stones have passed so he will stop the Flomax. We spent a total of ( 35  ) minutes reviewing records and discussing these issues.  Gershon Crane, MD

## 2023-04-27 ENCOUNTER — Ambulatory Visit (HOSPITAL_COMMUNITY): Payer: 59

## 2023-05-26 ENCOUNTER — Other Ambulatory Visit: Payer: 59

## 2023-05-26 ENCOUNTER — Other Ambulatory Visit (HOSPITAL_COMMUNITY): Payer: 59

## 2023-06-03 ENCOUNTER — Ambulatory Visit: Payer: 59 | Admitting: Cardiology

## 2023-07-18 ENCOUNTER — Other Ambulatory Visit: Payer: Self-pay | Admitting: Cardiology

## 2023-07-18 DIAGNOSIS — I10 Essential (primary) hypertension: Secondary | ICD-10-CM

## 2023-07-18 DIAGNOSIS — R0609 Other forms of dyspnea: Secondary | ICD-10-CM

## 2023-08-15 ENCOUNTER — Other Ambulatory Visit: Payer: Self-pay | Admitting: Cardiology

## 2023-08-15 DIAGNOSIS — I10 Essential (primary) hypertension: Secondary | ICD-10-CM

## 2023-08-15 DIAGNOSIS — R0609 Other forms of dyspnea: Secondary | ICD-10-CM

## 2023-08-20 ENCOUNTER — Other Ambulatory Visit: Payer: Self-pay | Admitting: Cardiology

## 2023-09-14 ENCOUNTER — Ambulatory Visit: Payer: Self-pay

## 2023-09-14 ENCOUNTER — Ambulatory Visit (INDEPENDENT_AMBULATORY_CARE_PROVIDER_SITE_OTHER): Admitting: Family Medicine

## 2023-09-14 VITALS — BP 108/66 | HR 58 | Temp 98.2°F | Wt 241.0 lb

## 2023-09-14 DIAGNOSIS — N451 Epididymitis: Secondary | ICD-10-CM

## 2023-09-14 LAB — POC URINALSYSI DIPSTICK (AUTOMATED)
Bilirubin, UA: NEGATIVE
Glucose, UA: NEGATIVE
Leukocytes, UA: NEGATIVE
Nitrite, UA: NEGATIVE
Protein, UA: NEGATIVE
Spec Grav, UA: 1.025 (ref 1.010–1.025)
Urobilinogen, UA: 0.2 U/dL
pH, UA: 5.5 (ref 5.0–8.0)

## 2023-09-14 MED ORDER — DOXYCYCLINE HYCLATE 100 MG PO TABS: 100.0000 mg | ORAL_TABLET | Freq: Two times a day (BID) | ORAL | 0 refills | Status: DC

## 2023-09-14 MED ORDER — TRAMADOL HCL 50 MG PO TABS: 100.0000 mg | ORAL_TABLET | Freq: Four times a day (QID) | ORAL | 1 refills | Status: DC | PRN

## 2023-09-14 NOTE — Progress Notes (Signed)
   Subjective:    Patient ID: Willie Dominguez, male    DOB: [birth year], 73 y.o.   MRN: [id number]  HPI Here for intermittent pain in the right testicle for the past 5 months. This started right after he had a robotic assisted right colectomy. He has never felt a lump. He notes that he had a hernia repaired on this side when he was a young child. BM's are normal. Then 2 weeks ago he also developed a constant pain above the pubic area. No fever or nausea.    Review of Systems  Constitutional: Negative.   Respiratory: Negative.    Cardiovascular: Negative.   Gastrointestinal:  Positive for abdominal pain. Negative for abdominal distention, blood in stool, diarrhea, nausea and vomiting.  Genitourinary:  Positive for testicular pain. Negative for dysuria, flank pain, frequency, hematuria, scrotal swelling and urgency.       Objective:   Physical Exam Constitutional:      Appearance: Normal appearance. He is not ill-appearing.  Cardiovascular:     Rate and Rhythm: Normal rate and regular rhythm.     Pulses: Normal pulses.     Heart sounds: Normal heart sounds.  Pulmonary:     Effort: Pulmonary effort is normal.     Breath sounds: Normal breath sounds.  Abdominal:     General: Abdomen is flat. Bowel sounds are normal. There is no distension.     Palpations: Abdomen is soft. There is no mass.     Tenderness: There is no right CVA tenderness, left CVA tenderness, guarding or rebound.     Hernia: No hernia is present.     Comments: He is tender in the suprapubic area   Genitourinary:    Penis: Normal.      Comments: Left testis and epididymis normal. Right testicle and epididymis are tender. No masses felt  Neurological:     Mental Status: He is alert.           Assessment & Plan:  This is consistent with epididymitis. Treat with 10 days of Doxycycline . He will schedule a complete physical 2 weeks from now, and we will follow up on this issue at that time.  Garnette Olmsted,  MD

## 2023-09-14 NOTE — Telephone Encounter (Signed)
 Pt was seen today by Dr Clent Ridges for this problem

## 2023-09-14 NOTE — Addendum Note (Signed)
 Addended by: LADONNA INOCENTE SAILOR on: 09/14/2023 04:50 PM   Modules accepted: Orders

## 2023-09-14 NOTE — Telephone Encounter (Signed)
 FYI Only or Action Required?: FYI only for provider.  Patient was last seen in primary care on 04/26/2023 by [name] LABOR, MD.  Called Nurse Triage reporting Groin Pain.  Symptoms began several months ago.  Interventions attempted: OTC medications: Tylenol .  Symptoms are: groin/scrotal pain comes in waves ; chills gradually worsening.  Triage Disposition: See Physician Within 24 Hours  Patient/caregiver understands and will follow disposition?: Yes                Copied from CRM [id number]. Topic: Clinical - Red Word Triage >> Sep 14, 2023  8:02 AM Larissa RAMAN wrote: [facility] that prompted transfer to Nurse Triage: groin pain -worsening Reason for Disposition  [1] Pain comes and goes (intermittent) AND [2] present > 24 hours  Answer Assessment - Initial Assessment Questions Limited triage due to wife, Elenor, calling in and patient not available to answer questions. Advised wife to ask patient if he is having any of the emergent symptoms (severe, constant scrotal or testicular pain, vomiting, fever,swelling) and if so to go to ED. Otherwise okay to see PCP today for scheduled visit.  1. LOCATION and RADIATION: Where is the pain located?      Wife unsure which side, but she states it is groin and scrotal. She thinks the left side.  2. QUALITY: What does the pain feel like?  (e.g., sharp, dull, aching, burning)     Unsure.  3. SEVERITY: How bad is the pain?  (Scale 1-10; or mild, moderate, severe)   - MILD (1-3): doesn't interfere with normal activities    - MODERATE (4-7): interferes with normal activities (e.g., work or school) or awakens from sleep   - SEVERE (8-10): excruciating pain, unable to do any normal activities, difficulty walking     She is unsure but states he was unable to sleep last night, moderate to severe.  4. ONSET: When did the pain start?     X couple months, gradually worsened over the past week.  5. PATTERN: Does it come and go, or  has it been constant since it started?     Comes and goes.  6. SCROTAL APPEARANCE: What does the scrotum look like? Is there any swelling or redness?      Wife states she asked last night if the patient felt any swelling or bulging and he told her no.  7. HERNIA: Has a doctor ever told you that you have a hernia?     Wife states patient had one as a child.  8. OTHER SYMPTOMS: Do you have any other symptoms? (e.g., fever, abdominal pain, vomiting, difficulty passing urine)     Wife denies nausea, vomiting, blood in urine, difficulty passing urine, fever. She states she is unsure about abdominal pain and states last night he had chills.  Protocols used: Scrotal Pain-A-AH

## 2023-09-28 ENCOUNTER — Ambulatory Visit: Admitting: Family Medicine

## 2023-09-28 ENCOUNTER — Encounter: Payer: Self-pay | Admitting: Family Medicine

## 2023-09-28 VITALS — BP 104/62 | HR 63 | Temp 97.8°F | Wt 238.0 lb

## 2023-09-28 DIAGNOSIS — N138 Other obstructive and reflux uropathy: Secondary | ICD-10-CM

## 2023-09-28 DIAGNOSIS — R739 Hyperglycemia, unspecified: Secondary | ICD-10-CM

## 2023-09-28 DIAGNOSIS — I10 Essential (primary) hypertension: Secondary | ICD-10-CM | POA: Diagnosis not present

## 2023-09-28 DIAGNOSIS — N401 Enlarged prostate with lower urinary tract symptoms: Secondary | ICD-10-CM

## 2023-09-28 DIAGNOSIS — N50811 Right testicular pain: Secondary | ICD-10-CM

## 2023-09-28 DIAGNOSIS — D696 Thrombocytopenia, unspecified: Secondary | ICD-10-CM

## 2023-09-28 DIAGNOSIS — D72819 Decreased white blood cell count, unspecified: Secondary | ICD-10-CM

## 2023-09-28 MED ORDER — CIPROFLOXACIN HCL 500 MG PO TABS: 500.0000 mg | ORAL_TABLET | Freq: Two times a day (BID) | ORAL | 0 refills | Status: AC

## 2023-09-28 NOTE — Progress Notes (Signed)
   Subjective:    Patient ID: Willie Dominguez, male    DOB: Jan 09, 1951, 73 y.o.   MRN: [id number]  HPI Here to follow up on right groin and right testicle pain. We saw him on 09-14-23 for this, and he took 10 days of Doxycycline . This did not help and the pain persists. He also has some pain above the pubis. No fever or urinary symptoms.  He had a right colectomy in February per Dr. Elspeth Schultze.   Review of Systems  Constitutional: Negative.   Respiratory: Negative.    Cardiovascular: Negative.   Gastrointestinal:  Positive for abdominal pain. Negative for abdominal distention, blood in stool, constipation, diarrhea, nausea and vomiting.  Genitourinary:  Positive for testicular pain. Negative for difficulty urinating, dysuria, flank pain, frequency, hematuria and urgency.       Objective:   Physical Exam Constitutional:      Appearance: Normal appearance.  Cardiovascular:     Rate and Rhythm: Normal rate and regular rhythm.     Pulses: Normal pulses.     Heart sounds: Normal heart sounds.  Pulmonary:     Effort: Pulmonary effort is normal.     Breath sounds: Normal breath sounds.  Abdominal:     General: Abdomen is flat. Bowel sounds are normal. There is no distension.     Palpations: Abdomen is soft. There is no mass.     Tenderness: There is no guarding or rebound.     Hernia: No hernia is present.     Comments: Mildly tender in the RLQ and suprapubic areas  Genitourinary:    Comments: The right testicle is tender. No masses or hernias are felt  Neurological:     Mental Status: He is alert.           Assessment & Plan:  Right lower abdominal and right testicle pain. We will start him on 10 days of Cipro . He will also make an appt with Dr. Schultze asap to evaluate.  Garnette Olmsted, MD

## 2023-09-29 ENCOUNTER — Other Ambulatory Visit (INDEPENDENT_AMBULATORY_CARE_PROVIDER_SITE_OTHER)

## 2023-09-29 DIAGNOSIS — I10 Essential (primary) hypertension: Secondary | ICD-10-CM | POA: Diagnosis not present

## 2023-09-29 DIAGNOSIS — N138 Other obstructive and reflux uropathy: Secondary | ICD-10-CM

## 2023-09-29 DIAGNOSIS — N401 Enlarged prostate with lower urinary tract symptoms: Secondary | ICD-10-CM

## 2023-09-29 DIAGNOSIS — R739 Hyperglycemia, unspecified: Secondary | ICD-10-CM

## 2023-09-29 LAB — BASIC METABOLIC PANEL WITH GFR
BUN: 13 mg/dL (ref 6–23)
CO2: 26 meq/L (ref 19–32)
Calcium: 9 mg/dL (ref 8.4–10.5)
Chloride: 107 meq/L (ref 96–112)
Creatinine, Ser: 1.11 mg/dL (ref 0.40–1.50)
GFR: 66.25 mL/min (ref >60.00)
Glucose, Bld: 118 mg/dL — ABNORMAL HIGH (ref 70–99)
Potassium: 3.9 meq/L (ref 3.5–5.1)
Sodium: 140 meq/L (ref 135–145)

## 2023-09-29 LAB — TSH: TSH: 1.18 u[IU]/mL (ref 0.35–5.50)

## 2023-09-29 LAB — CBC WITH DIFFERENTIAL/PLATELET
Basophils Absolute: 0 K/uL (ref 0.0–0.1)
Basophils Relative: 0.3 % (ref 0.0–3.0)
Eosinophils Absolute: 0 K/uL (ref 0.0–0.7)
Eosinophils Relative: 1.4 % (ref 0.0–5.0)
HCT: 38.8 % — ABNORMAL LOW (ref 39.0–52.0)
Hemoglobin: 13 g/dL (ref 13.0–17.0)
Lymphocytes Relative: 26.8 % (ref 12.0–46.0)
Lymphs Abs: 0.9 K/uL (ref 0.7–4.0)
MCHC: 33.7 g/dL (ref 30.0–36.0)
MCV: 90.6 fl (ref 78.0–100.0)
Monocytes Absolute: 0.4 K/uL (ref 0.1–1.0)
Monocytes Relative: 11.9 % (ref 3.0–12.0)
Neutro Abs: 1.9 K/uL (ref 1.4–7.7)
Neutrophils Relative %: 59.6 % (ref 43.0–77.0)
Platelets: 125 K/uL — ABNORMAL LOW (ref 150.0–400.0)
RBC: 4.28 Mil/uL (ref 4.22–5.81)
RDW: 14.4 % (ref 11.5–15.5)
WBC: 3.3 K/uL — ABNORMAL LOW (ref 4.0–10.5)

## 2023-09-29 LAB — PSA: PSA: 0.24 ng/mL (ref 0.10–4.00)

## 2023-09-29 LAB — HEPATIC FUNCTION PANEL
ALT: 27 U/L (ref 0–53)
AST: 25 U/L (ref 0–37)
Albumin: 4.1 g/dL (ref 3.5–5.2)
Alkaline Phosphatase: 67 U/L (ref 39–117)
Bilirubin, Direct: 0.2 mg/dL (ref 0.0–0.3)
Total Bilirubin: 0.7 mg/dL (ref 0.2–1.2)
Total Protein: 6.3 g/dL (ref 6.0–8.3)

## 2023-09-29 LAB — LIPID PANEL
Cholesterol: 104 mg/dL (ref 0–200)
HDL: 51.7 mg/dL (ref >39.00)
LDL Cholesterol: 41 mg/dL (ref 0–99)
NonHDL: 52.67
Total CHOL/HDL Ratio: 2
Triglycerides: 56 mg/dL (ref 0.0–149.0)
VLDL: 11.2 mg/dL (ref 0.0–40.0)

## 2023-09-29 LAB — HEMOGLOBIN A1C: Hgb A1c MFr Bld: 5.8 % (ref 4.6–6.5)

## 2023-09-30 ENCOUNTER — Ambulatory Visit: Payer: Self-pay | Admitting: Family Medicine

## 2023-09-30 NOTE — Addendum Note (Signed)
 Addended by: JOHNNY SENIOR A on: 09/30/2023 12:22 PM   Modules accepted: Orders

## 2023-10-01 ENCOUNTER — Telehealth: Payer: Self-pay | Admitting: Family Medicine

## 2023-10-01 NOTE — Telephone Encounter (Signed)
 Pt is returning jo call

## 2023-10-01 NOTE — Telephone Encounter (Signed)
 Patient is returning call from the practice. Relayed message that was in chart. Please call back patient if more information is needed.

## 2023-10-04 ENCOUNTER — Telehealth: Payer: Self-pay

## 2023-10-04 NOTE — Telephone Encounter (Signed)
 Patient referred by PCP for thrombocytopenia.  Patient scheduled for visit with Dr. Lonn on 8/14 at 9:00 AM. Patient confirmed date and time of the appointment.

## 2023-10-06 NOTE — Telephone Encounter (Signed)
 Yes I think he should still see Hematology because the visit last year was about his blood clots, while this would be about the WBC and platelets

## 2023-10-06 NOTE — Telephone Encounter (Signed)
 Reviewed lab results with pt voiced understanding. Pt states that he saw hematology back in 2024 Lonn Hicks, MD and they did some labs which were inconclusive. Pt wants Dr Johnny to review the visit notes and labs from the Oncology and advised if he needs to still see the Hematology

## 2023-10-06 NOTE — Telephone Encounter (Signed)
 Spoke with pt advised of Dr Johnny recommendation, pt has appointment set up with hematology for next week

## 2023-10-14 ENCOUNTER — Inpatient Hospital Stay: Attending: Hematology and Oncology | Admitting: Hematology and Oncology

## 2023-10-14 ENCOUNTER — Inpatient Hospital Stay

## 2023-10-14 ENCOUNTER — Encounter: Payer: Self-pay | Admitting: Hematology and Oncology

## 2023-10-14 ENCOUNTER — Other Ambulatory Visit (HOSPITAL_COMMUNITY): Payer: Self-pay | Admitting: Surgery

## 2023-10-14 VITALS — BP 133/78 | HR 63 | Temp 98.8°F | Resp 18 | Ht 72.0 in | Wt 240.4 lb

## 2023-10-14 DIAGNOSIS — M2550 Pain in unspecified joint: Secondary | ICD-10-CM | POA: Insufficient documentation

## 2023-10-14 DIAGNOSIS — M549 Dorsalgia, unspecified: Secondary | ICD-10-CM | POA: Diagnosis not present

## 2023-10-14 DIAGNOSIS — I2699 Other pulmonary embolism without acute cor pulmonale: Secondary | ICD-10-CM | POA: Diagnosis not present

## 2023-10-14 DIAGNOSIS — G629 Polyneuropathy, unspecified: Secondary | ICD-10-CM | POA: Insufficient documentation

## 2023-10-14 DIAGNOSIS — Z79899 Other long term (current) drug therapy: Secondary | ICD-10-CM | POA: Insufficient documentation

## 2023-10-14 DIAGNOSIS — Z7901 Long term (current) use of anticoagulants: Secondary | ICD-10-CM | POA: Insufficient documentation

## 2023-10-14 DIAGNOSIS — G609 Hereditary and idiopathic neuropathy, unspecified: Secondary | ICD-10-CM | POA: Diagnosis not present

## 2023-10-14 DIAGNOSIS — D696 Thrombocytopenia, unspecified: Secondary | ICD-10-CM

## 2023-10-14 DIAGNOSIS — D61818 Other pancytopenia: Secondary | ICD-10-CM | POA: Diagnosis not present

## 2023-10-14 DIAGNOSIS — G8929 Other chronic pain: Secondary | ICD-10-CM | POA: Diagnosis not present

## 2023-10-14 DIAGNOSIS — R1031 Right lower quadrant pain: Secondary | ICD-10-CM

## 2023-10-14 DIAGNOSIS — M898X9 Other specified disorders of bone, unspecified site: Secondary | ICD-10-CM

## 2023-10-14 LAB — CBC WITH DIFFERENTIAL (CANCER CENTER ONLY)
Abs Immature Granulocytes: 0.02 K/uL (ref 0.00–0.07)
Basophils Absolute: 0 K/uL (ref 0.0–0.1)
Basophils Relative: 1 %
Eosinophils Absolute: 0.1 K/uL (ref 0.0–0.5)
Eosinophils Relative: 2 %
HCT: 39.6 % (ref 39.0–52.0)
Hemoglobin: 13.5 g/dL (ref 13.0–17.0)
Immature Granulocytes: 1 %
Lymphocytes Relative: 23 %
Lymphs Abs: 0.8 K/uL (ref 0.7–4.0)
MCH: 31 pg (ref 26.0–34.0)
MCHC: 34.1 g/dL (ref 30.0–36.0)
MCV: 91 fL (ref 80.0–100.0)
Monocytes Absolute: 0.3 K/uL (ref 0.1–1.0)
Monocytes Relative: 9 %
Neutro Abs: 2.4 K/uL (ref 1.7–7.7)
Neutrophils Relative %: 64 %
Platelet Count: 109 K/uL — ABNORMAL LOW (ref 150–400)
RBC: 4.35 MIL/uL (ref 4.22–5.81)
RDW: 13.9 % (ref 11.5–15.5)
WBC Count: 3.7 K/uL — ABNORMAL LOW (ref 4.0–10.5)
nRBC: 0 % (ref 0.0–0.2)

## 2023-10-14 LAB — VITAMIN D 25 HYDROXY (VIT D DEFICIENCY, FRACTURES): Vit D, 25-Hydroxy: 52.94 ng/mL (ref 30–100)

## 2023-10-14 LAB — HEPATITIS C ANTIBODY: HCV Ab: NONREACTIVE

## 2023-10-14 LAB — C-REACTIVE PROTEIN: CRP: <0.5 mg/dL (ref <1.0)

## 2023-10-14 LAB — SEDIMENTATION RATE: Sed Rate: 10 mm/h (ref 0–16)

## 2023-10-14 LAB — HIV ANTIBODY (ROUTINE TESTING W REFLEX): HIV Screen 4th Generation wRfx: NONREACTIVE

## 2023-10-14 LAB — VITAMIN B12: Vitamin B-12: 773 pg/mL (ref 180–914)

## 2023-10-14 NOTE — Assessment & Plan Note (Addendum)
 The patient is noted to have intermittent pancytopenia for very long time Previously, he had a positive autoimmune screen I suspect he might have autoimmune related leukopenia and thrombocytopenia I will order additional workup  He has no signs of bleeding He can continue anticoagulation therapy as long as his platelet count remain over 50

## 2023-10-14 NOTE — Progress Notes (Signed)
 [facility] OFFICE PROGRESS NOTE  Patient Care Team: [name] LABOR, MD as PCP - General (Family Medicine) Sheldon Standing, MD as Consulting Physician (General Surgery) Elmira Newman PARAS, MD as Consulting Physician (Cardiology) Kara Dorn NOVAK, MD as Consulting Physician (Pulmonary Disease) Mansouraty, Aloha Raddle., MD as Consulting Physician (Gastroenterology)  Assessment & Plan Pancytopenia, acquired [facility]) The patient is noted to have intermittent pancytopenia for very long time Previously, he had a positive autoimmune screen I suspect he might have autoimmune related leukopenia and thrombocytopenia I will order additional workup  He has no signs of bleeding He can continue anticoagulation therapy as long as his platelet count remain over 50 Hereditary and idiopathic peripheral neuropathy He has chronic neuropathy He is taking vitamin B12 supplement Previously, I will order screening for paraproteinemia Will repeat the test today as the patient noticed that to be more pronounced I will also check a vitamin B12 level despite the fact that he is taking oral supplement in case she has malabsorption issue, along with copper  level Recurrent pulmonary emboli (HCC) He is doing well and will continue chronic anticoagulation therapy indefinitely Chronic joint pain He has chronic joint pain and muscle stiffness in the morning I will order screening for rheumatoid arthritis  Orders Placed This Encounter  Procedures   CBC with Differential (Cancer Center Only)    Standing Status:   Future    Number of Occurrences:   1    Expiration Date:   10/13/2024   Sedimentation rate    Standing Status:   Future    Number of Occurrences:   1    Expiration Date:   10/13/2024   Vitamin B12    Standing Status:   Future    Number of Occurrences:   1    Expiration Date:   10/13/2024   Beta 2 microglobulin, serum    Standing Status:   Future    Number of Occurrences:   1     Expiration Date:   10/13/2024   Copper , serum    Standing Status:   Future    Number of Occurrences:   1    Expiration Date:   10/13/2024   HIV antibody (with reflex)    Standing Status:   Future    Number of Occurrences:   1    Expiration Date:   10/13/2024   Hepatitis C antibody    Standing Status:   Future    Number of Occurrences:   1    Expiration Date:   10/13/2024   C-reactive protein    Standing Status:   Future    Number of Occurrences:   1    Expiration Date:   10/13/2024   ANA, IFA (with reflex)    Standing Status:   Future    Number of Occurrences:   1    Expiration Date:   10/13/2024   Rheumatoid factor    Standing Status:   Future    Number of Occurrences:   1    Expiration Date:   10/13/2024   Cyclic Citrul Peptide Ab, IgG, IgA    Standing Status:   Future    Number of Occurrences:   1    Expiration Date:   10/13/2024   VITAMIN D  25 Hydroxy (Vit-D Deficiency, Fractures)    Standing Status:   Future    Number of Occurrences:   1    Expiration Date:   10/13/2024   Kappa/lambda light chains    Standing  Status:   Future    Number of Occurrences:   1    Expiration Date:   10/13/2024   Multiple Myeloma Panel (SPEP&IFE w/QIG)    Standing Status:   Future    Number of Occurrences:   1    Expiration Date:   10/13/2024     [name], MD  INTERVAL HISTORY: he returns for further evaluation I saw him in 2024 for with diagnosis of recurrent PE The patient is noted to have intermittent thrombocytopenia by his primary care doctor and he was referred here for evaluation On review of his blood work dated back to 2007, he is noted to have chronic intermittent pancytopenia with intermittent leukopenia and intermittent thrombocytopenia He denies recurrent infection The patient denies any recent signs or symptoms of bleeding such as spontaneous epistaxis, hematuria or hematochezia. The patient has chronic diffuse joint pain in his back, neck, knees and ankles He think he might  have arthritis or rheumatoid arthritis He also noticed some peripheral neuropathy He is taking chronic vitamin B12 supplement and denies alcohol intake He has several CT imaging done which showed no abnormal enlarged spleen  PHYSICAL EXAMINATION: ECOG PERFORMANCE STATUS: 1 - Symptomatic but completely ambulatory  Vitals:   10/14/23 0902  BP: 133/78  Pulse: 63  Resp: 18  Temp: 98.8 F (37.1 C)  SpO2: 95%   Filed Weights   10/14/23 0902  Weight: 240 lb 6.4 oz (109 kg)    Relevant data reviewed during this visit included CBC, CT imaging from 2024

## 2023-10-14 NOTE — Assessment & Plan Note (Addendum)
 He is doing well and will continue chronic anticoagulation therapy indefinitely

## 2023-10-14 NOTE — Assessment & Plan Note (Addendum)
 He has chronic joint pain and muscle stiffness in the morning I will order screening for rheumatoid arthritis

## 2023-10-14 NOTE — Assessment & Plan Note (Addendum)
 He has chronic neuropathy He is taking vitamin B12 supplement Previously, I will order screening for paraproteinemia Will repeat the test today as the patient noticed that to be more pronounced I will also check a vitamin B12 level despite the fact that he is taking oral supplement in case she has malabsorption issue, along with copper  level

## 2023-10-15 ENCOUNTER — Ambulatory Visit: Payer: Self-pay | Admitting: Surgery

## 2023-10-15 ENCOUNTER — Ambulatory Visit (HOSPITAL_COMMUNITY)
Admission: RE | Admit: 2023-10-15 | Discharge: 2023-10-15 | Disposition: A | Discharge status: Home / Self Care | Source: Ambulatory Visit | Attending: Surgery | Admitting: Surgery

## 2023-10-15 ENCOUNTER — Other Ambulatory Visit: Payer: Self-pay | Admitting: Cardiology

## 2023-10-15 DIAGNOSIS — I10 Essential (primary) hypertension: Secondary | ICD-10-CM

## 2023-10-15 DIAGNOSIS — R1031 Right lower quadrant pain: Secondary | ICD-10-CM | POA: Insufficient documentation

## 2023-10-15 DIAGNOSIS — R0609 Other forms of dyspnea: Secondary | ICD-10-CM

## 2023-10-15 MED ORDER — IOHEXOL 300 MG/ML  SOLN
100.0000 mL | Freq: Once | INTRAMUSCULAR | Status: AC | PRN
  Administered 2023-10-15: 100 mL via INTRAVENOUS

## 2023-10-18 LAB — MULTIPLE MYELOMA PANEL, SERUM
Albumin SerPl Elph-Mcnc: 3.7 g/dL (ref 2.9–4.4)
Albumin/Glob SerPl: 1.3 (ref 0.7–1.7)
Alpha 1: 0.2 g/dL (ref 0.0–0.4)
Alpha2 Glob SerPl Elph-Mcnc: 0.8 g/dL (ref 0.4–1.0)
B-Globulin SerPl Elph-Mcnc: 1 g/dL (ref 0.7–1.3)
Gamma Glob SerPl Elph-Mcnc: 0.9 g/dL (ref 0.4–1.8)
Globulin, Total: 3 g/dL (ref 2.2–3.9)
IgA: 188 mg/dL (ref 61–437)
IgG (Immunoglobin G), Serum: 889 mg/dL (ref 603–1613)
IgM (Immunoglobulin M), Srm: 126 mg/dL (ref 15–143)
Total Protein ELP: 6.7 g/dL (ref 6.0–8.5)

## 2023-10-20 LAB — RHEUMATOID FACTOR: Rheumatoid fact SerPl-aCnc: <10 [IU]/mL (ref <14.0)

## 2023-10-20 LAB — COPPER, SERUM: Copper: 72 ug/dL (ref 69–132)

## 2023-10-20 LAB — CYCLIC CITRUL PEPTIDE ANTIBODY, IGG/IGA: CCP Antibodies IgG/IgA: 6 U (ref 0–19)

## 2023-10-20 LAB — KAPPA/LAMBDA LIGHT CHAINS
Kappa free light chain: 17.6 mg/L (ref 3.3–19.4)
Kappa, lambda light chain ratio: 1.18 (ref 0.26–1.65)
Lambda free light chains: 14.9 mg/L (ref 5.7–26.3)

## 2023-10-20 LAB — BETA 2 MICROGLOBULIN, SERUM: Beta-2 Microglobulin: 2 mg/L (ref 0.6–2.4)

## 2023-10-20 LAB — ANTINUCLEAR ANTIBODIES, IFA: ANA Ab, IFA: NEGATIVE

## 2023-10-28 ENCOUNTER — Inpatient Hospital Stay (HOSPITAL_BASED_OUTPATIENT_CLINIC_OR_DEPARTMENT_OTHER): Admitting: Hematology and Oncology

## 2023-10-28 ENCOUNTER — Encounter: Payer: Self-pay | Admitting: Hematology and Oncology

## 2023-10-28 VITALS — BP 133/71 | HR 66 | Temp 98.3°F | Resp 18 | Ht 72.0 in | Wt 243.0 lb

## 2023-10-28 DIAGNOSIS — I2699 Other pulmonary embolism without acute cor pulmonale: Secondary | ICD-10-CM | POA: Diagnosis not present

## 2023-10-28 DIAGNOSIS — D61818 Other pancytopenia: Secondary | ICD-10-CM

## 2023-10-28 NOTE — Assessment & Plan Note (Addendum)
 He is doing well and will continue chronic anticoagulation therapy indefinitely

## 2023-10-28 NOTE — Progress Notes (Signed)
 [facility] OFFICE PROGRESS NOTE  Patient Care Team: [name] LABOR, MD as PCP - General (Family Medicine) Sheldon Standing, MD as Consulting Physician (General Surgery) Elmira Newman PARAS, MD as Consulting Physician (Cardiology) Kara Dorn NOVAK, MD as Consulting Physician (Pulmonary Disease) Mansouraty, Aloha Raddle., MD as Consulting Physician (Gastroenterology)  Assessment & Plan Pancytopenia, acquired [facility]) The patient is noted to have intermittent pancytopenia for very long time Previously, he had a positive autoimmune screen However, repeat autoimmune screen, rheumatoid factor and anti-CCP all came back negative He is completely asymptomatic We discussed all test results that came back unremarkable He has no signs of bleeding He can continue anticoagulation therapy as long as his platelet count remain over 50  We discussed risk and benefits of bone marrow biopsy but the patient currently agrees to defer due to lack of symptoms which I think is reasonable I will see him in 5 months for further follow-up Recurrent pulmonary emboli (HCC) He is doing well and will continue chronic anticoagulation therapy indefinitely  Orders Placed This Encounter  Procedures   CBC with Differential (Cancer Center Only)    Standing Status:   Future    Expiration Date:   10/27/2024     [name], MD  INTERVAL HISTORY: he returns for surveillance follow-up for review of test results for acquired pancytopenia Patient denies recent bleeding such as epistaxis, hematuria or hematochezia I reviewed all test results including recent CT imaging that showed no evidence of abnormal liver or spleen  PHYSICAL EXAMINATION: ECOG PERFORMANCE STATUS: 0 - Asymptomatic  Vitals:   10/28/23 0933  BP: 133/71  Pulse: 66  Resp: 18  Temp: 98.3 F (36.8 C)  SpO2: 97%   Lab Results  Component Value Date   WBC 3.7 (L) 10/14/2023   HGB 13.5 10/14/2023   HCT 39.6 10/14/2023   MCV 91.0  10/14/2023   PLT 109 (L) 10/14/2023   SUMMARY OF HEMATOLOGIC HISTORY: I saw him in 2024 for with diagnosis of recurrent PE The patient is noted to have intermittent thrombocytopenia by his primary care doctor and he was referred here for evaluation On review of his blood work dated back to 2007, he is noted to have chronic intermittent pancytopenia with intermittent leukopenia and intermittent thrombocytopenia He denies recurrent infection The patient denies any recent signs or symptoms of bleeding such as spontaneous epistaxis, hematuria or hematochezia. The patient has chronic diffuse joint pain in his back, neck, knees and ankles He think he might have arthritis or rheumatoid arthritis He also noticed some peripheral neuropathy He is taking chronic vitamin B12 supplement and denies alcohol intake He has several CT imaging done which showed no abnormal enlarged spleen

## 2023-10-28 NOTE — Assessment & Plan Note (Addendum)
 The patient is noted to have intermittent pancytopenia for very long time Previously, he had a positive autoimmune screen However, repeat autoimmune screen, rheumatoid factor and anti-CCP all came back negative He is completely asymptomatic We discussed all test results that came back unremarkable He has no signs of bleeding He can continue anticoagulation therapy as long as his platelet count remain over 50  We discussed risk and benefits of bone marrow biopsy but the patient currently agrees to defer due to lack of symptoms which I think is reasonable I will see him in 5 months for further follow-up

## 2023-11-12 ENCOUNTER — Other Ambulatory Visit (HOSPITAL_COMMUNITY): Payer: Self-pay | Admitting: Medical

## 2023-11-12 ENCOUNTER — Ambulatory Visit (HOSPITAL_COMMUNITY)
Admission: RE | Admit: 2023-11-12 | Discharge: 2023-11-12 | Disposition: A | Discharge status: Home / Self Care | Source: Ambulatory Visit | Attending: Vascular Surgery | Admitting: Vascular Surgery

## 2023-11-12 DIAGNOSIS — M25561 Pain in right knee: Secondary | ICD-10-CM | POA: Insufficient documentation

## 2023-11-15 ENCOUNTER — Ambulatory Visit: Admitting: Family Medicine

## 2023-11-15 ENCOUNTER — Encounter: Payer: Self-pay | Admitting: Family Medicine

## 2023-11-15 VITALS — BP 110/62 | HR 56 | Temp 98.0°F | Ht 71.5 in | Wt 241.0 lb

## 2023-11-15 DIAGNOSIS — N529 Male erectile dysfunction, unspecified: Secondary | ICD-10-CM | POA: Diagnosis not present

## 2023-11-15 DIAGNOSIS — R0609 Other forms of dyspnea: Secondary | ICD-10-CM | POA: Diagnosis not present

## 2023-11-15 DIAGNOSIS — Z23 Encounter for immunization: Secondary | ICD-10-CM | POA: Diagnosis not present

## 2023-11-15 DIAGNOSIS — Z0000 Encounter for general adult medical examination without abnormal findings: Secondary | ICD-10-CM | POA: Diagnosis not present

## 2023-11-15 DIAGNOSIS — I10 Essential (primary) hypertension: Secondary | ICD-10-CM | POA: Diagnosis not present

## 2023-11-15 LAB — TESTOSTERONE: Testosterone: 211.41 ng/dL — ABNORMAL LOW (ref 300.00–890.00)

## 2023-11-15 MED ORDER — VALSARTAN 80 MG PO TABS: 80.0000 mg | ORAL_TABLET | Freq: Every day | ORAL | 3 refills | Status: AC

## 2023-11-15 MED ORDER — TRAMADOL HCL 50 MG PO TABS: 100.0000 mg | ORAL_TABLET | Freq: Four times a day (QID) | ORAL | 5 refills | Status: AC | PRN

## 2023-11-15 MED ORDER — FUROSEMIDE 20 MG PO TABS: 20.0000 mg | ORAL_TABLET | Freq: Every day | ORAL | 3 refills | Status: DC

## 2023-11-15 NOTE — Progress Notes (Signed)
 Subjective:    Patient ID: Willie Dominguez, male    DOB: 16-Jan-1951, 73 y.o.   MRN: [id number]  HPI Here for a well exam. He is doing fairly well. He had a laparoscopic umibilical hernia repair along with a proximal right colectomy. Adenomatous tissue had been found in the cecum on colonscopy, so this was resected. He then developed lower abdominal pain and right testicular pain. He saw Dr. Sheldon for a surgery follow up, and he mentioned these pains. It was felt he had some diverticulitis, so he was treated with a course of Cipro  and Metronidazole . The pains have improved but he still has some right testicle pain. Dr. Sheldon referred him to Urology, but he is waiting for that office to contact him. He saw Dr. Lonn recently for mild pancytopenia. She decided to simply monitor this for now, and she set up a 5 month follow up. He has had some erection issues the past year.    Review of Systems  Constitutional: Negative.   HENT: Negative.    Eyes: Negative.   Respiratory: Negative.    Cardiovascular: Negative.   Gastrointestinal:  Positive for abdominal pain.  Genitourinary:  Positive for testicular pain.  Musculoskeletal: Negative.   Skin: Negative.   Neurological: Negative.   Psychiatric/Behavioral: Negative.         Objective:   Physical Exam Constitutional:      General: He is not in acute distress.    Appearance: Normal appearance. He is well-developed. He is not diaphoretic.  HENT:     Head: Normocephalic and atraumatic.     Right Ear: External ear normal.     Left Ear: External ear normal.     Nose: Nose normal.     Mouth/Throat:     Pharynx: No oropharyngeal exudate.  Eyes:     General: No scleral icterus.       Right eye: No discharge.        Left eye: No discharge.     Conjunctiva/sclera: Conjunctivae normal.     Pupils: Pupils are equal, round, and reactive to light.  Neck:     Thyroid : No thyromegaly.     Vascular: No JVD.     Trachea: No tracheal deviation.   Cardiovascular:     Rate and Rhythm: Normal rate and regular rhythm.     Pulses: Normal pulses.     Heart sounds: Normal heart sounds. No murmur heard.    No friction rub. No gallop.  Pulmonary:     Effort: Pulmonary effort is normal. No respiratory distress.     Breath sounds: Normal breath sounds. No wheezing or rales.  Chest:     Chest wall: No tenderness.  Abdominal:     General: Bowel sounds are normal. There is no distension.     Palpations: Abdomen is soft. There is no mass.     Tenderness: There is no abdominal tenderness. There is no guarding or rebound.  Genitourinary:    Penis: Normal. No tenderness.      Prostate: Normal.     Rectum: Normal. Guaiac result negative.     Comments: Right testicle is mildly tender Musculoskeletal:        General: No tenderness. Normal range of motion.     Cervical back: Neck supple.  Lymphadenopathy:     Cervical: No cervical adenopathy.  Skin:    General: Skin is warm and dry.     Coloration: Skin is not pale.     Findings: No  erythema or rash.  Neurological:     General: No focal deficit present.     Mental Status: He is alert and oriented to person, place, and time.     Cranial Nerves: No cranial nerve deficit.     Motor: No abnormal muscle tone.     Coordination: Coordination normal.     Deep Tendon Reflexes: Reflexes are normal and symmetric. Reflexes normal.  Psychiatric:        Mood and Affect: Mood normal.        Behavior: Behavior normal.        Thought Content: Thought content normal.        Judgment: Judgment normal.           Assessment & Plan:  Well exam. We discussed diet and exercise. Get fasting labs. For the ED, we will check a testosterone  level. His recent diverticulitis seems to have resolved. He will see Urology for the testicle pain. Garnette Olmsted, MD

## 2023-11-15 NOTE — Addendum Note (Signed)
 Addended by: LADONNA INOCENTE SAILOR on: 11/15/2023 03:05 PM   Modules accepted: Orders

## 2023-11-16 ENCOUNTER — Ambulatory Visit: Payer: Self-pay | Admitting: Family Medicine

## 2023-11-16 MED ORDER — "SAFETY SYRINGES/NEEDLE 20G X 1-1/2"" 3 ML MISC": 1.0000 | 0 refills | Status: DC

## 2023-11-16 MED ORDER — TESTOSTERONE CYPIONATE 200 MG/ML IM SOLN: 200.0000 mg | INTRAMUSCULAR | 1 refills | Status: AC

## 2023-11-16 NOTE — Addendum Note (Signed)
 Addended by: JOHNNY SENIOR A on: 11/16/2023 12:45 PM   Modules accepted: Orders

## 2023-11-22 ENCOUNTER — Other Ambulatory Visit: Payer: Self-pay | Admitting: Family Medicine

## 2023-11-22 ENCOUNTER — Ambulatory Visit: Payer: Self-pay

## 2023-11-22 MED ORDER — METRONIDAZOLE 500 MG PO TABS: 500.0000 mg | ORAL_TABLET | Freq: Three times a day (TID) | ORAL | 0 refills | Status: DC

## 2023-11-22 MED ORDER — CIPROFLOXACIN HCL 500 MG PO TABS: 500.0000 mg | ORAL_TABLET | Freq: Two times a day (BID) | ORAL | 0 refills | Status: AC

## 2023-11-22 NOTE — Telephone Encounter (Signed)
 Left pt a detailed message regarding Rx, advised to pick up from the pharmacy

## 2023-11-22 NOTE — Progress Notes (Signed)
 Done

## 2023-11-22 NOTE — Telephone Encounter (Signed)
 FYI Only or Action Required?: Action required by provider: medication refill request.Pt's wife is requesting a 10 day course of ABX.  Cipro  and metronidazole  for a diverticulitis flare. Discussed at OV last Monday  Patient was last seen in primary care on 11/15/2023 by Willie Dominguez LABOR, MD.  Called Nurse Triage reporting Abdominal Cramping, pain.  Symptoms began a week ago.  Interventions attempted: Dietary changes.  Symptoms are: gradually worsening.  Triage Disposition: See HCP Within 4 Hours (Or PCP Triage)  Patient/caregiver understands and will follow disposition?: No, refuses disposition                     Copied from CRM [id number]. Topic: Clinical - Red Word Triage >> Nov 22, 2023  9:03 AM Berwyn MATSU wrote: Red Word that prompted transfer to Nurse Triage: per patient wife patient is having abdominal pain and having a diverticulitis flare up. Reason for Disposition  [1] MILD-MODERATE pain AND [2] constant AND [3] present > 2 hours  Answer Assessment - Initial Assessment Questions Pt has hx of diverticulitis. Pt has been struggling with this all summer long. Pt was given 5 day course of Cipro  and metronidazole  which eased symptoms, however now pain has increased. Pt was seen last Monday by Dr. Johnny and it was discussed that usually a course of 10 days of these medications are needed to resolve infection. Pt's wife is requesting a 10 day course of ABX.      1. LOCATION: Where does it hurt?      lower 2. RADIATION: Does the pain shoot anywhere else? (e.g., chest, back)     No - when it gets really bad the pain spikes 3. ONSET: When did the pain begin? (Minutes, hours or days ago)      All summer long 4. SUDDEN: Gradual or sudden onset?     gradual 5. PATTERN Does the pain come and go, or is it constant?     Comes and goes with different foods 6. SEVERITY: How bad is the pain?  (e.g., Scale 1-10; mild, moderate, or severe)     moderate 7. RECURRENT  SYMPTOM: Have you ever had this type of stomach pain before? If Yes, ask: When was the last time? and What happened that time?      yes 8. CAUSE: What do you think is causing the stomach pain? (e.g., gallstones, recent abdominal surgery)     Diverticulitis 9. RELIEVING/AGGRAVATING FACTORS: What makes it better or worse? (e.g., antacids, bending or twisting motion, bowel movement)     Metronidazole  and Cipro  10. OTHER SYMPTOMS: Do you have any other symptoms? (e.g., back pain, diarrhea, fever, urination pain, vomiting)       no  Protocols used: Abdominal Pain - Male-A-AH

## 2023-11-22 NOTE — Telephone Encounter (Signed)
 I sent in 10 days of Cipro  and Metronidazole 

## 2023-11-22 NOTE — Telephone Encounter (Signed)
 I sent in the Cipro  and the Metronidazole 

## 2023-11-23 NOTE — Telephone Encounter (Signed)
 Spoke with pt stated that he picked up Rx and already started taking

## 2023-12-28 ENCOUNTER — Encounter: Payer: Self-pay | Admitting: Pulmonary Disease

## 2023-12-28 ENCOUNTER — Telehealth: Payer: Self-pay

## 2023-12-28 ENCOUNTER — Encounter: Payer: Self-pay | Admitting: Family Medicine

## 2023-12-28 NOTE — Telephone Encounter (Signed)
 I will leave this decision up to Dr. Kara

## 2023-12-30 NOTE — Telephone Encounter (Signed)
 Fax received from Dr. Reyes Billing with [facility] to perform a right knee arthroscopy, partial medial, menis on patient.  Patient needs surgery clearance. Surgery is pending . Patient was seen on 02/09/23. Office protocol is a risk assessment can be sent to surgeon if patient has been seen in 60 days or less.    I called and spoke with the pt and got his scheduled with TP for risk assessment on 01/03/24\ Routing to clearance pool until this visit is complete

## 2023-12-31 ENCOUNTER — Telehealth: Payer: Self-pay

## 2023-12-31 ENCOUNTER — Telehealth: Payer: Self-pay | Admitting: Family Medicine

## 2023-12-31 NOTE — Telephone Encounter (Signed)
 Pt has up coming appointment with NP for risk assessment.  And is aware that all information after appointment with NP will be sent over, so he will be able to have his surgery on time.

## 2023-12-31 NOTE — Telephone Encounter (Signed)
 Spoke with patient over surgery, he and wife requested for Provider to see the scans from his knee  for records / calling to make sure we have the information and scans at this time only notes are available to clinic to view.    Made request for scan to be shared with clinic (VM/LM) with [facility]  per pt request.

## 2023-12-31 NOTE — Telephone Encounter (Signed)
 Tried to return sherry call VM/ LM -    Mr & Mrs Heffern would like ortha to scan of the knee  from last month. They would like Dr. Kara to have it for his records

## 2023-12-31 NOTE — Telephone Encounter (Signed)
 Ms sherry leaves at 4:30 today

## 2023-12-31 NOTE — Telephone Encounter (Signed)
Routing to Kelly.  ?

## 2023-12-31 NOTE — Telephone Encounter (Signed)
 Copied from CRM [id number]. Topic: Clinical - Medication Question >> Dec 31, 2023  4:19 PM Ashley R wrote: Reason for CRM: Following up on call/mychart message re: PCP sign off to pause on BP medication required before knee examination. RE: Willie Dominguez

## 2023-12-31 NOTE — Telephone Encounter (Signed)
 Willie Dominguez say she will mail disc to office with images

## 2023-12-31 NOTE — Telephone Encounter (Signed)
 [facility] ms sherry  calling to speak with CMA kelly . Called cal and voicemail is coming on please give ms sherry a call back  [place] asked if we could push the images through pax we are not able to do that but we can put the images on a disc and mail the disc to us .  [phone] ext [phone]

## 2023-12-31 NOTE — Telephone Encounter (Signed)
 Patient is ok to hold Xarelto  for 2 days prior to the procedure and then resume as soon as able per the surgeon.   He can keep the appointment for December as planned.  Thanks, JD

## 2023-12-31 NOTE — Telephone Encounter (Signed)
 Ms sharl called back to speak with kelly phones had disconnected . Called cal and they say she is not available . Relayed to sharrie .

## 2024-01-03 ENCOUNTER — Ambulatory Visit: Admitting: Adult Health

## 2024-01-03 ENCOUNTER — Ambulatory Visit: Payer: Self-pay | Admitting: Orthopedic Surgery

## 2024-01-03 ENCOUNTER — Telehealth: Payer: Self-pay

## 2024-01-03 ENCOUNTER — Encounter: Payer: Self-pay | Admitting: Adult Health

## 2024-01-03 VITALS — BP 131/77 | HR 77 | Temp 98.3°F | Ht 72.0 in | Wt 243.0 lb

## 2024-01-03 DIAGNOSIS — Z86711 Personal history of pulmonary embolism: Secondary | ICD-10-CM

## 2024-01-03 DIAGNOSIS — Z6832 Body mass index (BMI) 32.0-32.9, adult: Secondary | ICD-10-CM

## 2024-01-03 DIAGNOSIS — I2699 Other pulmonary embolism without acute cor pulmonale: Secondary | ICD-10-CM | POA: Diagnosis not present

## 2024-01-03 DIAGNOSIS — I82511 Chronic embolism and thrombosis of right femoral vein: Secondary | ICD-10-CM | POA: Diagnosis not present

## 2024-01-03 DIAGNOSIS — R942 Abnormal results of pulmonary function studies: Secondary | ICD-10-CM

## 2024-01-03 DIAGNOSIS — R0602 Shortness of breath: Secondary | ICD-10-CM | POA: Diagnosis not present

## 2024-01-03 DIAGNOSIS — Z01811 Encounter for preprocedural respiratory examination: Secondary | ICD-10-CM

## 2024-01-03 MED ORDER — RIVAROXABAN 20 MG PO TABS: 20.0000 mg | ORAL_TABLET | Freq: Every day | ORAL | 11 refills | Status: DC

## 2024-01-03 NOTE — Telephone Encounter (Signed)
 Pt surgical clearance form was received completed and successfully faxed to [employer]

## 2024-01-03 NOTE — Telephone Encounter (Signed)
 Spoke with Katheryn with [facility], advised to fax pt surgical clearance form to our office for Dr Johnny to sign off. Awaiting on the form from Lori

## 2024-01-03 NOTE — Telephone Encounter (Signed)
 Please and give them my verbal OKAY to do the surgery
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
## 2024-01-03 NOTE — Patient Instructions (Addendum)
 Continue on Xarelto  20mg  daily Report any signs of bleeding Avoid NSAIDs -ibuprofen, advil, etc.  Good luck with upcoming surgery  Activity as tolerated.  Work on healthy weight loss.  Avoid sleeping on your back, sleep with your bed inclined.  Follow up with Dr. Kara or Kaleya Douse NP in 4 months and As needed

## 2024-01-03 NOTE — Telephone Encounter (Signed)
 Done please send to coordinator

## 2024-01-04 NOTE — Telephone Encounter (Signed)
 Done- ov note 01/03/24 was faxed to beecher Mar at [facility].

## 2024-01-05 ENCOUNTER — Telehealth: Payer: Self-pay

## 2024-01-05 NOTE — Telephone Encounter (Signed)
 Disc received from [facility]  Sent to canopy to upload

## 2024-01-05 NOTE — Progress Notes (Signed)
 COVID Vaccine received:  []  No [x]  Yes Date of any COVID positive Test in last 90 days:  PCP - Garnette Olmsted, MD  Cardiologist - Newman Lawrence, MD  Pulmonology- Dorn Chill MD  Madelin Stank, NP clearance scanned to Media and in 01-03-24 note  Chest x-ray - 03-19-2021  2v EKG - 06-03-2022   repeat  Stress Test - 09-25-21 ECHO - 03-16-2023 Cardiac Cath - 10-03-2019  LHC / CORS  DES x1 by Dr. Lawrence   Also had [facility] - DES x1 on 04-18-19  CT Coronary Calcium  score:   Pacemaker / ICD device [x]  No []  Yes   Spinal Cord Stimulator:[x]  No []  Yes       History of Sleep Apnea? []  No [x]  Yes   mild CPAP used?- [x]  No []  Yes    Patient has: [x]  NO Hx DM   []  Pre-DM   []  DM1  []   DM2 Does the patient monitor blood sugar?   [x]  N/A   []  No []  Yes  Last A1c was:  5.8  on 09-29-23  Blood Thinner / Instructions: Xarelto -  Hold x 2-3? days per Dr. Chill  Aspirin  Instructions:  none  Comments:   Activity level: Able to walk up 2 flights of stairs without becoming significantly short of breath or having chest pain?  []  No   []    Yes  Patient can perform ADLs without assistance. []  No   []   Yes  Anesthesia review: CAD-DES x 2, HTN, Hx RLE DVT & PEs, Bradycardia, Thrombocytopenia, Restrictive Lung disease, Blind in right Eye, Hard to wake up  Patient denies any S&S of respiratory illness or Covid - no shortness of breath, fever, cough or chest pain at PAT appointment.  Patient verbalized understanding and agreement to the Pre-Surgical Instructions that were given to them at this PAT appointment. Patient was also educated of the need to review these PAT instructions again prior to his surgery.I reviewed the appropriate phone numbers to call if they have any and questions or concerns.

## 2024-01-05 NOTE — Patient Instructions (Signed)
 SURGICAL WAITING ROOM VISITATION Patients having surgery or a procedure may have no more than 2 support people in the waiting area - these visitors may rotate in the visitor waiting room.   If the patient needs to stay at the hospital during part of their recovery, the visitor guidelines for inpatient rooms apply.  PRE-OP VISITATION  Pre-op nurse will coordinate an appropriate time for 1 support person to accompany the patient in pre-op.  This support person may not rotate.  This visitor will be contacted when the time is appropriate for the visitor to come back in the pre-op area.  Please refer to the [facility] website for the visitor guidelines for Inpatients (after your surgery is over and you are in a regular room).  You are not required to quarantine at this time prior to your surgery. However, you must do this: Hand Hygiene often Do NOT share personal items Notify your provider if you are in close contact with someone who has COVID or you develop fever 100.4 or greater, new onset of sneezing, cough, sore throat, shortness of breath or body aches.  If you test positive for Covid or have been in contact with anyone that has tested positive in the last 10 days please notify you surgeon.    Your procedure is scheduled on: Wednesday  January 12, 2024   Report to [facility] Main Entrance: [address] entrance where the [employer] is available.   Report to admitting at   08:00  AM  Call this number if you have any questions or problems the morning of surgery [phone]  FOLLOW ANY ADDITIONAL PRE OP INSTRUCTIONS YOU RECEIVED FROM YOUR SURGEON'S OFFICE!!!  Do not eat food after Midnight the night prior to your surgery/procedure.  After Midnight you may have the following liquids until  07:15 AM DAY OF SURGERY  Clear Liquid Diet Water  Black Coffee (sugar ok, NO MILK/CREAM OR CREAMERS)  Tea (sugar ok, NO MILK/CREAM OR CREAMERS) regular and decaf                              Plain Jell-O  with no fruit (NO RED)                                           Fruit ices (not with fruit pulp, NO RED)                                     Popsicles (NO RED)                                                                  Juice: NO CITRUS JUICES: only apple, WHITE grape, WHITE cranberry Sports drinks like Gatorade or Powerade (NO RED)                   The day of surgery:  Drink ONE (1) Pre-Surgery Clear Ensure at  07:15 AM the morning of surgery. Drink in one sitting. Do not sip.  This drink was given to  you during your hospital pre-op appointment visit. Nothing else to drink after completing the Pre-Surgery Clear Ensure : No candy, chewing gum or throat lozenges.     Oral Hygiene is also important to reduce your risk of infection.        Remember - BRUSH YOUR TEETH THE MORNING OF SURGERY WITH YOUR REGULAR TOOTHPASTE  Do NOT smoke after Midnight the night before surgery.  STOP TAKING all Vitamins, Herbs and supplements 1 week before your surgery.   XARELTO - Stop taking 72 hours before your surgery before your surgery. Last dose will be taken on Saturday 01-08-2024  Take ONLY these medicines the morning of surgery with A SIP OF WATER :  You may take Tylenol  OR Tramadol  if needed for pain.   DO NOT TAKE Valsartan  , Furosemide  the morning of your surgery.                    You may not have any metal on your body including  jewelry, and body piercing  Do not wear lotions, powders, cologne, or deodorant  Men may shave face and neck.  Contacts, Hearing Aids, dentures or bridgework may not be worn into surgery. DENTURES WILL BE REMOVED PRIOR TO SURGERY PLEASE DO NOT APPLY Poly grip OR ADHESIVES!!!  You may bring a small overnight bag with you on the day of surgery, only pack items that are not valuable. [facility] IS NOT RESPONSIBLE   FOR VALUABLES THAT ARE LOST OR STOLEN.   Do not bring your home medications to the hospital. The Pharmacy will dispense  medications listed on your medication list to you during your admission in the Hospital.  Please read over the following fact sheets you were given: IF YOU HAVE QUESTIONS ABOUT YOUR PRE-OP INSTRUCTIONS, PLEASE CALL [phone]   [facility] - Preparing for Surgery        Before surgery, you can play an important role.  Because skin is not sterile, your skin needs to be as free of germs as possible.  You can reduce the number of germs on your skin by washing with CHG (chlorahexidine gluconate) soap before surgery.  CHG is an antiseptic cleaner which kills germs and bonds with the skin to continue killing germs even after washing. Please DO NOT use if you have an allergy to CHG or antibacterial soaps.  If your skin becomes reddened/irritated stop using the CHG and inform your nurse when you arrive at Short Stay. Do not shave (including legs and underarms) for at least 48 hours prior to the first CHG shower.  You may shave your face/neck.  Please follow these instructions carefully:  1.  Shower with CHG Soap the night before surgery ONLY (DO NOT USE THE CHG SOAP THE MORNING OF SURGERY).  2.  If you choose to wash your hair, wash your hair first as usual with your normal  shampoo.  3.  After you shampoo, rinse your hair and body thoroughly to remove the shampoo.                             4.  Use CHG as you would any other liquid soap.  You can apply chg directly to the skin and wash.  Gently with a scrungie or clean washcloth.  5.  Apply the CHG Soap to your body ONLY FROM THE NECK DOWN.   Do not use on face/ open  Wound or open sores. Avoid contact with eyes, ears mouth and genitals (private parts).                       Wash face,  Genitals (private parts) with your normal soap.             6.  Wash thoroughly, paying special attention to the area where your  surgery  will be performed.  7.  Thoroughly rinse your body with warm water  from the neck down.  8.  DO NOT  shower/wash with your normal soap after using and rinsing off the CHG Soap.                9.  Pat yourself dry with a clean towel.            10.  Wear clean pajamas.            11.  Place clean sheets on your bed the night of your first shower and do not  sleep with pets.  Day of Surgery : Do not apply any CHG, lotions/deodorants the morning of surgery.  Please wear clean clothes to the hospital/surgery center.   FAILURE TO FOLLOW THESE INSTRUCTIONS MAY RESULT IN THE CANCELLATION OF YOUR SURGERY  PATIENT SIGNATURE_________________________________  NURSE SIGNATURE__________________________________  ________________________________________________________________________        [facility]    An incentive spirometer is a tool that can help keep your lungs clear and active. This tool measures how well you are filling your lungs with each breath. Taking long deep breaths may help reverse or decrease the chance of developing breathing (pulmonary) problems (especially infection) following: A long period of time when you are unable to move or be active. BEFORE THE PROCEDURE  If the spirometer includes an indicator to show your best effort, your nurse or respiratory therapist will set it to a desired goal. If possible, sit up straight or lean slightly forward. Try not to slouch. Hold the incentive spirometer in an upright position. INSTRUCTIONS FOR USE  Sit on the edge of your bed if possible, or sit up as far as you can in bed or on a chair. Hold the incentive spirometer in an upright position. Breathe out normally. Place the mouthpiece in your mouth and seal your lips tightly around it. Breathe in slowly and as deeply as possible, raising the piston or the ball toward the top of the column. Hold your breath for 3-5 seconds or for as long as possible. Allow the piston or ball to fall to the bottom of the column. Remove the mouthpiece from your mouth and breathe out  normally. Rest for a few seconds and repeat Steps 1 through 7 at least 10 times every 1-2 hours when you are awake. Take your time and take a few normal breaths between deep breaths. The spirometer may include an indicator to show your best effort. Use the indicator as a goal to work toward during each repetition. After each set of 10 deep breaths, practice coughing to be sure your lungs are clear. If you have an incision (the cut made at the time of surgery), support your incision when coughing by placing a pillow or rolled up towels firmly against it. Once you are able to get out of bed, walk around indoors and cough well. You may stop using the incentive spirometer when instructed by your caregiver.  RISKS AND COMPLICATIONS Take your time so you do not get dizzy or light-headed.  If you are in pain, you may need to take or ask for pain medication before doing incentive spirometry. It is harder to take a deep breath if you are having pain. AFTER USE Rest and breathe slowly and easily. It can be helpful to keep track of a log of your progress. Your caregiver can provide you with a simple table to help with this. If you are using the spirometer at home, follow these instructions: SEEK MEDICAL CARE IF:  You are having difficultly using the spirometer. You have trouble using the spirometer as often as instructed. Your pain medication is not giving enough relief while using the spirometer. You develop fever of 100.5 F (38.1 C) or higher.                                                                                                    SEEK IMMEDIATE MEDICAL CARE IF:  You cough up bloody sputum that had not been present before. You develop fever of 102 F (38.9 C) or greater. You develop worsening pain at or near the incision site. MAKE SURE YOU:  Understand these instructions. Will watch your condition. Will get help right away if you are not doing well or get worse. Document Released:  06/29/2006 Document Revised: 05/11/2011 Document Reviewed: 08/30/2006 [facility] Patient Information 2014 [place], [place].

## 2024-01-06 ENCOUNTER — Encounter (HOSPITAL_COMMUNITY): Payer: Self-pay

## 2024-01-06 ENCOUNTER — Other Ambulatory Visit: Payer: Self-pay

## 2024-01-06 ENCOUNTER — Encounter (HOSPITAL_COMMUNITY)
Admission: RE | Admit: 2024-01-06 | Discharge: 2024-01-06 | Disposition: A | Discharge status: Home / Self Care | Source: Ambulatory Visit | Attending: Specialist | Admitting: Specialist

## 2024-01-06 VITALS — BP 119/74 | HR 66 | Temp 98.2°F | Resp 16 | Ht 72.0 in | Wt 240.0 lb

## 2024-01-06 DIAGNOSIS — Z01812 Encounter for preprocedural laboratory examination: Secondary | ICD-10-CM | POA: Diagnosis present

## 2024-01-06 DIAGNOSIS — X58XXXA Exposure to other specified factors, initial encounter: Secondary | ICD-10-CM | POA: Diagnosis not present

## 2024-01-06 DIAGNOSIS — D696 Thrombocytopenia, unspecified: Secondary | ICD-10-CM | POA: Diagnosis not present

## 2024-01-06 DIAGNOSIS — I10 Essential (primary) hypertension: Secondary | ICD-10-CM | POA: Diagnosis not present

## 2024-01-06 DIAGNOSIS — I25118 Atherosclerotic heart disease of native coronary artery with other forms of angina pectoris: Secondary | ICD-10-CM | POA: Diagnosis not present

## 2024-01-06 DIAGNOSIS — Z7901 Long term (current) use of anticoagulants: Secondary | ICD-10-CM | POA: Insufficient documentation

## 2024-01-06 DIAGNOSIS — Z86711 Personal history of pulmonary embolism: Secondary | ICD-10-CM | POA: Diagnosis not present

## 2024-01-06 DIAGNOSIS — Z7902 Long term (current) use of antithrombotics/antiplatelets: Secondary | ICD-10-CM

## 2024-01-06 DIAGNOSIS — Z01818 Encounter for other preprocedural examination: Secondary | ICD-10-CM | POA: Insufficient documentation

## 2024-01-06 DIAGNOSIS — Z79899 Other long term (current) drug therapy: Secondary | ICD-10-CM

## 2024-01-06 DIAGNOSIS — S83241A Other tear of medial meniscus, current injury, right knee, initial encounter: Secondary | ICD-10-CM | POA: Diagnosis not present

## 2024-01-06 DIAGNOSIS — Z86718 Personal history of other venous thrombosis and embolism: Secondary | ICD-10-CM | POA: Diagnosis not present

## 2024-01-06 DIAGNOSIS — Z955 Presence of coronary angioplasty implant and graft: Secondary | ICD-10-CM | POA: Insufficient documentation

## 2024-01-06 DIAGNOSIS — Z01810 Encounter for preprocedural cardiovascular examination: Secondary | ICD-10-CM | POA: Diagnosis present

## 2024-01-06 HISTORY — DX: Attention-deficit hyperactivity disorder, unspecified type: F90.9

## 2024-01-06 HISTORY — DX: Atherosclerotic heart disease of native coronary artery without angina pectoris: I25.10

## 2024-01-06 HISTORY — DX: Cardiac arrhythmia, unspecified: I49.9

## 2024-01-06 HISTORY — DX: Disease of blood and blood-forming organs, unspecified: D75.9

## 2024-01-06 LAB — COMPREHENSIVE METABOLIC PANEL WITH GFR
ALT: 32 U/L (ref 0–44)
AST: 36 U/L (ref 15–41)
Albumin: 4.1 g/dL (ref 3.5–5.0)
Alkaline Phosphatase: 81 U/L (ref 38–126)
Anion gap: 9 (ref 5–15)
BUN: 16 mg/dL (ref 8–23)
CO2: 26 mmol/L (ref 22–32)
Calcium: 9.2 mg/dL (ref 8.9–10.3)
Chloride: 104 mmol/L (ref 98–111)
Creatinine, Ser: 1.34 mg/dL — ABNORMAL HIGH (ref 0.61–1.24)
GFR, Estimated: 56 mL/min — ABNORMAL LOW (ref >60)
Glucose, Bld: 121 mg/dL — ABNORMAL HIGH (ref 70–99)
Potassium: 4.1 mmol/L (ref 3.5–5.1)
Sodium: 139 mmol/L (ref 135–145)
Total Bilirubin: 0.7 mg/dL (ref 0.0–1.2)
Total Protein: 6.8 g/dL (ref 6.5–8.1)

## 2024-01-06 LAB — CBC
HCT: 43.3 % (ref 39.0–52.0)
Hemoglobin: 13.6 g/dL (ref 13.0–17.0)
MCH: 30.9 pg (ref 26.0–34.0)
MCHC: 31.4 g/dL (ref 30.0–36.0)
MCV: 98.4 fL (ref 80.0–100.0)
Platelets: 131 K/uL — ABNORMAL LOW (ref 150–400)
RBC: 4.4 MIL/uL (ref 4.22–5.81)
RDW: 14 % (ref 11.5–15.5)
WBC: 4.8 K/uL (ref 4.0–10.5)
nRBC: 0 % (ref 0.0–0.2)
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
## 2024-01-10 ENCOUNTER — Telehealth: Payer: Self-pay | Admitting: *Deleted

## 2024-01-10 NOTE — Telephone Encounter (Signed)
 Copied from CRM [id number]. Topic: Clinical - Medical Advice >> Jan 07, 2024  4:03 PM Joesph PARAS wrote: Reason for CRM: Patient is returning a call to [place]. Patient adamant to speak to [facility] and very upset that clinical staff do not remain in the office all day on Friday. Patient states he was cleared for surgery and now has been un-cleared by our practice in the name of additional tests at the last minute. Patient is very upset and would like to speak to [employer].  I called and spoke with the pt. I advised that our note with risk assessment with TP 11/3 was faxed to Ortho and if he has any questions about upcoming surgery he needs to speak with Ortho. He verbalized understanding. Nothing further needed.

## 2024-01-11 MED ORDER — GENTAMICIN SULFATE 40 MG/ML IJ SOLN
5.0000 mg/kg | Freq: Once | INTRAVENOUS | Status: DC
  Filled 2024-01-11: qty 11.25
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
## 2024-01-12 ENCOUNTER — Other Ambulatory Visit: Payer: Self-pay

## 2024-01-12 ENCOUNTER — Ambulatory Visit (HOSPITAL_COMMUNITY)
Admission: RE | Admit: 2024-01-12 | Discharge: 2024-01-12 | Disposition: A | Discharge status: Home / Self Care | Source: Ambulatory Visit | Attending: Specialist | Admitting: Specialist

## 2024-01-12 ENCOUNTER — Ambulatory Visit: Payer: Self-pay | Admitting: Orthopedic Surgery

## 2024-01-12 ENCOUNTER — Encounter (HOSPITAL_COMMUNITY): Payer: Self-pay | Admitting: Specialist

## 2024-01-12 ENCOUNTER — Ambulatory Visit (HOSPITAL_COMMUNITY): Payer: Self-pay | Admitting: Physician Assistant

## 2024-01-12 ENCOUNTER — Ambulatory Visit (HOSPITAL_COMMUNITY): Admitting: Registered Nurse

## 2024-01-12 ENCOUNTER — Encounter (HOSPITAL_COMMUNITY)
Admission: RE | Disposition: A | Discharge status: Home / Self Care | Payer: Self-pay | Source: Ambulatory Visit | Attending: Specialist

## 2024-01-12 DIAGNOSIS — Z86718 Personal history of other venous thrombosis and embolism: Secondary | ICD-10-CM | POA: Diagnosis not present

## 2024-01-12 DIAGNOSIS — Z7722 Contact with and (suspected) exposure to environmental tobacco smoke (acute) (chronic): Secondary | ICD-10-CM | POA: Insufficient documentation

## 2024-01-12 DIAGNOSIS — X58XXXA Exposure to other specified factors, initial encounter: Secondary | ICD-10-CM | POA: Insufficient documentation

## 2024-01-12 DIAGNOSIS — Z955 Presence of coronary angioplasty implant and graft: Secondary | ICD-10-CM | POA: Diagnosis not present

## 2024-01-12 DIAGNOSIS — S83241A Other tear of medial meniscus, current injury, right knee, initial encounter: Secondary | ICD-10-CM | POA: Diagnosis present

## 2024-01-12 DIAGNOSIS — Z7901 Long term (current) use of anticoagulants: Secondary | ICD-10-CM | POA: Insufficient documentation

## 2024-01-12 DIAGNOSIS — S83281A Other tear of lateral meniscus, current injury, right knee, initial encounter: Secondary | ICD-10-CM | POA: Diagnosis not present

## 2024-01-12 DIAGNOSIS — I2510 Atherosclerotic heart disease of native coronary artery without angina pectoris: Secondary | ICD-10-CM | POA: Insufficient documentation

## 2024-01-12 DIAGNOSIS — Z86711 Personal history of pulmonary embolism: Secondary | ICD-10-CM | POA: Insufficient documentation

## 2024-01-12 DIAGNOSIS — S83231A Complex tear of medial meniscus, current injury, right knee, initial encounter: Secondary | ICD-10-CM

## 2024-01-12 DIAGNOSIS — I2699 Other pulmonary embolism without acute cor pulmonale: Secondary | ICD-10-CM

## 2024-01-12 DIAGNOSIS — M1711 Unilateral primary osteoarthritis, right knee: Secondary | ICD-10-CM | POA: Insufficient documentation

## 2024-01-12 DIAGNOSIS — I10 Essential (primary) hypertension: Secondary | ICD-10-CM

## 2024-01-12 SURGERY — ARTHROSCOPY, KNEE, WITH MEDIAL MENISCECTOMY
Anesthesia: General | Site: Knee | Laterality: Right

## 2024-01-12 MED ORDER — BUPIVACAINE-EPINEPHRINE (PF) 0.5% -1:200000 IJ SOLN
INTRAMUSCULAR | Status: AC
  Filled 2024-01-12: qty 30

## 2024-01-12 MED ORDER — RIVAROXABAN 20 MG PO TABS: 20.0000 mg | ORAL_TABLET | Freq: Every day | ORAL | 11 refills | Status: AC

## 2024-01-12 MED ORDER — PROPOFOL 10 MG/ML IV BOLUS
INTRAVENOUS | Status: AC
  Filled 2024-01-12: qty 20

## 2024-01-12 MED ORDER — LIDOCAINE HCL (PF) 2 % IJ SOLN
INTRAMUSCULAR | Status: DC | PRN
  Administered 2024-01-12: 100 mg via INTRADERMAL

## 2024-01-12 MED ORDER — FENTANYL CITRATE (PF) 100 MCG/2ML IJ SOLN
INTRAMUSCULAR | Status: DC | PRN
  Administered 2024-01-12 (×2): 25 ug via INTRAVENOUS
  Administered 2024-01-12: 50 ug via INTRAVENOUS

## 2024-01-12 MED ORDER — ACETAMINOPHEN 500 MG PO TABS
ORAL_TABLET | ORAL | Status: AC
  Filled 2024-01-12: qty 2

## 2024-01-12 MED ORDER — LACTATED RINGERS IV SOLN: INTRAVENOUS | Status: DC

## 2024-01-12 MED ORDER — ONDANSETRON HCL 4 MG/2ML IJ SOLN
INTRAMUSCULAR | Status: DC | PRN
  Administered 2024-01-12: 4 mg via INTRAVENOUS

## 2024-01-12 MED ORDER — METHOCARBAMOL 500 MG PO TABS
ORAL_TABLET | ORAL | Status: AC
  Filled 2024-01-12: qty 1

## 2024-01-12 MED ORDER — FENTANYL CITRATE (PF) 50 MCG/ML IJ SOSY
25.0000 ug | PREFILLED_SYRINGE | INTRAMUSCULAR | Status: DC | PRN
  Administered 2024-01-12 (×2): 50 ug via INTRAVENOUS

## 2024-01-12 MED ORDER — MIDAZOLAM HCL 5 MG/5ML IJ SOLN
INTRAMUSCULAR | Status: DC | PRN
  Administered 2024-01-12: 1 mg via INTRAVENOUS

## 2024-01-12 MED ORDER — PROPOFOL 10 MG/ML IV BOLUS
INTRAVENOUS | Status: DC | PRN
Start: 2024-01-12 — End: 2024-01-12
  Administered 2024-01-12: 130 mg via INTRAVENOUS

## 2024-01-12 MED ORDER — LIDOCAINE HCL (PF) 2 % IJ SOLN
INTRAMUSCULAR | Status: AC
  Filled 2024-01-12: qty 5

## 2024-01-12 MED ORDER — METHOCARBAMOL 500 MG PO TABS
500.0000 mg | ORAL_TABLET | Freq: Three times a day (TID) | ORAL | Status: AC
  Administered 2024-01-12: 500 mg via ORAL

## 2024-01-12 MED ORDER — EPINEPHRINE PF 1 MG/ML IJ SOLN
INTRAMUSCULAR | Status: AC
  Filled 2024-01-12: qty 1

## 2024-01-12 MED ORDER — BUPIVACAINE-EPINEPHRINE 0.5% -1:200000 IJ SOLN
INTRAMUSCULAR | Status: DC | PRN
  Administered 2024-01-12: 30 mL

## 2024-01-12 MED ORDER — PHENYLEPHRINE 80 MCG/ML (10ML) SYRINGE FOR IV PUSH (FOR BLOOD PRESSURE SUPPORT): PREFILLED_SYRINGE | INTRAVENOUS | Status: DC | PRN

## 2024-01-12 MED ORDER — DROPERIDOL 2.5 MG/ML IJ SOLN: 0.6250 mg | Freq: Once | INTRAMUSCULAR | Status: DC | PRN

## 2024-01-12 MED ORDER — FENTANYL CITRATE (PF) 100 MCG/2ML IJ SOLN
INTRAMUSCULAR | Status: AC
  Filled 2024-01-12: qty 2

## 2024-01-12 MED ORDER — OXYCODONE HCL 5 MG/5ML PO SOLN: 5.0000 mg | Freq: Once | ORAL | Status: AC | PRN

## 2024-01-12 MED ORDER — ACETAMINOPHEN 10 MG/ML IV SOLN
INTRAVENOUS | Status: AC
Start: 2024-01-12 — End: 2024-01-12
  Filled 2024-01-12: qty 100

## 2024-01-12 MED ORDER — VANCOMYCIN HCL 1500 MG/300ML IV SOLN
1500.0000 mg | INTRAVENOUS | Status: DC
  Filled 2024-01-12: qty 300

## 2024-01-12 MED ORDER — OXYCODONE HCL 5 MG PO TABS: 5.0000 mg | ORAL_TABLET | ORAL | 0 refills | Status: AC | PRN

## 2024-01-12 MED ORDER — EPHEDRINE 5 MG/ML INJ
INTRAVENOUS | Status: AC
  Filled 2024-01-12: qty 5

## 2024-01-12 MED ORDER — ORAL CARE MOUTH RINSE: 15.0000 mL | Freq: Once | OROMUCOSAL | Status: AC

## 2024-01-12 MED ORDER — ACETAMINOPHEN 10 MG/ML IV SOLN
INTRAVENOUS | Status: DC | PRN
  Administered 2024-01-12: 1000 mg via INTRAVENOUS

## 2024-01-12 MED ORDER — DOCUSATE SODIUM 100 MG PO CAPS: 100.0000 mg | ORAL_CAPSULE | Freq: Two times a day (BID) | ORAL | 2 refills | Status: AC

## 2024-01-12 MED ORDER — OXYCODONE HCL 5 MG PO TABS
ORAL_TABLET | ORAL | Status: AC
  Filled 2024-01-12: qty 1

## 2024-01-12 MED ORDER — SODIUM CHLORIDE 0.9 % IR SOLN
Status: DC | PRN
  Administered 2024-01-12: 3001 mL

## 2024-01-12 MED ORDER — ACETAMINOPHEN 10 MG/ML IV SOLN: 1000.0000 mg | Freq: Once | INTRAVENOUS | Status: DC | PRN

## 2024-01-12 MED ORDER — EPHEDRINE SULFATE-NACL 50-0.9 MG/10ML-% IV SOSY
PREFILLED_SYRINGE | INTRAVENOUS | Status: DC | PRN
Start: 2024-01-12 — End: 2024-01-12
  Administered 2024-01-12: 5 mg via INTRAVENOUS

## 2024-01-12 MED ORDER — MIDAZOLAM HCL 2 MG/2ML IJ SOLN
INTRAMUSCULAR | Status: AC
  Filled 2024-01-12: qty 2

## 2024-01-12 MED ORDER — FENTANYL CITRATE (PF) 50 MCG/ML IJ SOSY
PREFILLED_SYRINGE | INTRAMUSCULAR | Status: AC
  Filled 2024-01-12: qty 1

## 2024-01-12 MED ORDER — CEFAZOLIN SODIUM-DEXTROSE 2-4 GM/100ML-% IV SOLN
2.0000 g | Freq: Once | INTRAVENOUS | Status: AC
  Administered 2024-01-12: 2 g via INTRAVENOUS
  Filled 2024-01-12: qty 100

## 2024-01-12 MED ORDER — CHLORHEXIDINE GLUCONATE 0.12 % MT SOLN
15.0000 mL | Freq: Once | OROMUCOSAL | Status: AC
  Administered 2024-01-12: 15 mL via OROMUCOSAL

## 2024-01-12 MED ORDER — OXYCODONE HCL 5 MG PO TABS
5.0000 mg | ORAL_TABLET | Freq: Once | ORAL | Status: AC | PRN
  Administered 2024-01-12: 5 mg via ORAL

## 2024-01-12 MED ORDER — DEXAMETHASONE SOD PHOSPHATE PF 10 MG/ML IJ SOLN
INTRAMUSCULAR | Status: DC | PRN
  Administered 2024-01-12: 10 mg via INTRAVENOUS

## 2024-01-12 MED ORDER — ONDANSETRON HCL 4 MG/2ML IJ SOLN
INTRAMUSCULAR | Status: AC
  Filled 2024-01-12: qty 2

## 2024-01-12 SURGICAL SUPPLY — 24 items
BAG COUNTER SPONGE SURGICOUNT (BAG) ×1 IMPLANT
BLADE SHAVER TORPEDO 4X13 (MISCELLANEOUS) IMPLANT
BNDG ELASTIC 6INX 5YD STR LF (GAUZE/BANDAGES/DRESSINGS) ×1 IMPLANT
BOOTIES KNEE HIGH SLOAN (MISCELLANEOUS) ×2 IMPLANT
COVER SURGICAL LIGHT HANDLE (MISCELLANEOUS) ×1 IMPLANT
DISSECTOR 3.5MM X 13CM CVD (MISCELLANEOUS) IMPLANT
DURAPREP 26ML APPLICATOR (WOUND CARE) ×1 IMPLANT
DW OUTFLOW CASSETTE/TUBE SET (MISCELLANEOUS) ×1 IMPLANT
GAUZE PAD ABD 8X10 STRL (GAUZE/BANDAGES/DRESSINGS) ×1 IMPLANT
GAUZE SPONGE 4X4 12PLY STRL (GAUZE/BANDAGES/DRESSINGS) ×1 IMPLANT
GLOVE BIOGEL PI IND STRL 7.0 (GLOVE) ×1 IMPLANT
GLOVE SURG SS PI 8.0 STRL IVOR (GLOVE) ×1 IMPLANT
GOWN STRL REUS W/ TWL XL LVL3 (GOWN DISPOSABLE) ×2 IMPLANT
KIT TURNOVER KIT A (KITS) ×1 IMPLANT
MANIFOLD NEPTUNE II (INSTRUMENTS) ×1 IMPLANT
PACK ARTHROSCOPY WL (CUSTOM PROCEDURE TRAY) ×1 IMPLANT
PADDING CAST COTTON 6X4 STRL (CAST SUPPLIES) ×1 IMPLANT
PENCIL SMOKE EVACUATOR (MISCELLANEOUS) IMPLANT
SUT ETHILON 4 0 PS 2 18 (SUTURE) ×1 IMPLANT
TOWEL OR 17X26 10 PK STRL BLUE (TOWEL DISPOSABLE) ×1 IMPLANT
TUBING ARTHROSCOPY IRRIG 16FT (MISCELLANEOUS) ×1 IMPLANT
WAND APOLLORF SJ50 AR-9845 (SURGICAL WAND) IMPLANT
WIPE CHG 2% PREP (PERSONAL CARE ITEMS) ×1 IMPLANT
WRAP KNEE MAXI GEL POST OP (GAUZE/BANDAGES/DRESSINGS) ×1 IMPLANT

## 2024-01-12 NOTE — Anesthesia Procedure Notes (Signed)
 Procedure Name: LMA Insertion Date/Time: 01/12/2024 10:11 AM  Performed by: Memory Armida LABOR, CRNAPre-anesthesia Checklist: Patient identified, Emergency Drugs available, Suction available, Patient being monitored and Timeout performed Patient Re-evaluated:Patient Re-evaluated prior to induction Oxygen Delivery Method: Circle system utilized Preoxygenation: Pre-oxygenation with 100% oxygen Induction Type: IV induction LMA: LMA with gastric port inserted LMA Size: 4.0 Number of attempts: 1 Placement Confirmation: positive ETCO2 and breath sounds checked- equal and bilateral Tube secured with: Tape Dental Injury: Teeth and Oropharynx as per pre-operative assessment

## 2024-01-12 NOTE — Progress Notes (Signed)
 Orthopedic Tech Progress Note Patient Details:  BARTH TRELLA 1950/07/26 [id number]  Ortho Devices Type of Ortho Device: Crutches Ortho Device/Splint Interventions: Ordered, Application, Adjustment   Post Interventions Instructions Provided: Adjustment of device, Care of device  Waylan Thom Loving 01/12/2024, 11:30 AM

## 2024-01-12 NOTE — Op Note (Signed)
 NAME: Willie Dominguez, NEEDLE MEDICAL RECORD NO: [id number] ACCOUNT NO: [id number] DATE OF BIRTH: 01-29-1951 FACILITY: [facility] LOCATION: [facility] PHYSICIAN: Reyes KYM Billing, MD  Operative Report   DATE OF PROCEDURE: 01/12/2024  PREOPERATIVE DIAGNOSES: 1. Medial meniscus tear complex, right knee. 2. Osteoarthritis, right knee.  POSTOPERATIVE DIAGNOSES: 1. Medial and lateral meniscus tears, right knee. 2. Tricompartmental osteoarthrosis.  PROCEDURE PERFORMED: 1. Right knee arthroscopy. 2. Partial medial and lateral meniscectomies. 3. Chondroplasty of the medial and lateral femoral condyles, medial and lateral tibial plateaus, patella and femoral sulcus.  ANESTHESIA: General.  ASSISTANT: Arlyne Randy, PA-C.  HISTORY: A 73 year old with locking, popping, and giving away of the right knee secondary to complex tear of the medial meniscus, refractory to conservative treatment indicated for knee arthroscopy, partial meniscectomy, and debridement. Risks and  benefits were discussed including bleeding, infection, damage to neurovascular structures, no change in symptoms or worsening symptoms, DVT, PE, and anesthetic complications, etc.  DESCRIPTION OF PROCEDURE: The patient was placed in the supine position. After induction of adequate general anesthesia, 2 g of Kefzol, right lower extremity was prepped and draped in the usual sterile fashion. The left lower extremity was well padded  with a TED stocking and a PAS stocking on the left. On the right, after a timeout was performed, a lateral parapatellar portal was fashioned with a #11 blade, and an ingress cannula and atraumatically placed. The air was utilized to insufflate the joint  at 35 mmHg. Under direct visualization, a medial parapatellar portal was fashioned with a #11 blade after localization with an 18-gauge needle, sparing the medial meniscus. Note was complex tearing of the medial meniscus in its middle and posterior  thirds. It was  unstable to probe palpation. There was extensive grade III change of the femoral condyle and tibial plateau and a large lesion about 2 x 1.5 cm on the weightbearing surface of the medial femoral condyle. It was near grade IV. Light  chondroplasty was performed on the femoral condyle and the tibial plateau. I introduced a basket and resected approximately 50% of the posterior two-thirds of the medial meniscus to a stable base, further contoured with an ArthroWand. The remnant was  stable to probe palpation. The posterior and anterior root was intact.  The ACL was unremarkable.  The lateral compartment revealed a tear in the anterior horn of the lateral meniscus extending into the anterior third. He had grade III changes throughout the femoral condyle and tibial plateau. Light chondroplasty was performed on the femoral condyle  and tibial plateau. I resected the meniscal tear to a stable base, further contoured with an ArthroWand. The anterior and posterior root was intact and the remnant was stable to palpation. No grade IV changes. The tear was mainly radial and horizontal.  The suprapatellar pouch revealed extensive grade III changes of the patella and the sulcus. Light chondroplasty was performed on both. There was normal patellofemoral tracking and the gutter was unremarkable. I revisited all compartments. There was no  further pathology amenable to arthroscopic intervention. I therefore removed all instrumentation. Portals were closed with 4-0 nylon simple sutures and 0.25% Marcaine  with epinephrine  was infiltrated in the joint. The wound was dressed sterilely. A TED  stocking was placed. The patient was extubated and transported to the recovery room with PAS stockings, in satisfactory condition.  The patient tolerated the procedure well. No complications.  The assistant, Arlyne Randy, PA-C, was used throughout the case for patient positioning, monitoring inflow and outflow, and placing the  patient's leg  in a figure-of-four position to gain access to a very tight lateral compartment.  BLOOD LOSS: Minimal.   PUS D: 01/12/2024 11:08:35 am T: 01/12/2024 11:21:00 am  JOB: [id number]/ [id number]

## 2024-01-12 NOTE — Brief Op Note (Signed)
 01/12/2024  9:43 AM  PATIENT:  Willie Dominguez  73 y.o. male  PRE-OPERATIVE DIAGNOSIS:  right medial meniscus tear knee  POST-OPERATIVE DIAGNOSIS:  * No post-op diagnosis entered *  PROCEDURE:  Procedure(s) with comments: ARTHROSCOPY, KNEE, WITH MEDIAL MENISCECTOMY (Right) - right knee arthrocopy partial medial meniscus and debridement  SURGEON:  Surgeons and Role:    DEWAINE Duwayne Purchase, MD - Primary  PHYSICIAN ASSISTANT:   ASSISTANTS: Bissell   ANESTHESIA:   general  EBL:  min   BLOOD ADMINISTERED:none  DRAINS: none   LOCAL MEDICATIONS USED:  MARCAINE      SPECIMEN:  No Specimen  DISPOSITION OF SPECIMEN:  N/A  COUNTS:  YES  TOURNIQUET:  * No tourniquets in log *  DICTATION: .Other Dictation: Dictation Number [id number]  PLAN OF CARE: Discharge to home after PACU  PATIENT DISPOSITION:  PACU - hemodynamically stable.   Delay start of Pharmacological VTE agent (>24hrs) due to surgical blood loss or risk of bleeding: no

## 2024-01-12 NOTE — Interval H&P Note (Signed)
 History and Physical Interval Note:  01/12/2024 9:53 AM  Willie Dominguez  has presented today for surgery, with the diagnosis of right medial meniscus tear knee.  The various methods of treatment have been discussed with the patient and family. After consideration of risks, benefits and other options for treatment, the patient has consented to  Procedure(s) with comments: ARTHROSCOPY, KNEE, WITH MEDIAL MENISCECTOMY (Right) - right knee arthrocopy partial medial meniscus and debridement as a surgical intervention.  The patient's history has been reviewed, patient examined, no change in status, stable for surgery.  I have reviewed the patient's chart and labs.  Questions were answered to the patient's satisfaction.     Reyes JAYSON Billing

## 2024-01-12 NOTE — Discharge Instructions (Addendum)
 Incentive spirometer every hour while awake for four days  Keep dressing clean and dry at all times. After 2 days, remove dressing and place bandaids over incisions. Do not kneel or squat. Ice and elevate, toes above the nose, 20-30 min at a time, 4-5 times per day to reduce swelling.  Follow up in 10-14 days for suture removal.  Continue use of TED stockings until seen in follow-up in 2 weeks.  Resume Xarelto  tonight with dinner.

## 2024-01-12 NOTE — Op Note (Signed)
 NAME: Willie Dominguez, Willie Dominguez MEDICAL RECORD NO: [id number] ACCOUNT NO: [id number] DATE OF BIRTH: 04-Mar-1950 FACILITY: [facility] LOCATION: [facility] PHYSICIAN: Reyes KYM Billing, MD  Operative Report   DATE OF PROCEDURE: 01/12/2024  PREOPERATIVE DIAGNOSES: 1. Medial meniscus tear, right knee. 2. Osteoarthritis, right knee.  POSTOPERATIVE DIAGNOSES: 1. Medial meniscus and lateral meniscus tears, right knee. 2. Tricompartmental osteoarthrosis.  PROCEDURE PERFORMED: 1. Right knee arthroscopy. 2. Partial medial and lateral meniscectomies. 3. Chondroplasty of the medial and lateral femoral condyle, medial and lateral tibial plateau, patella, femoral sulcus.  ANESTHESIA: General.  ASSISTANT: Jaqueline Bissell, PA.  HISTORY: A 73 year old with locking, popping, and giving away of the right knee failing conservative treatment. MRI indicating complex tear of the medial meniscus indicated for knee arthroscopy, partial meniscectomy, and debridement. Risks and benefits  discussed.  DICTATION ENDS HERE   PUS D: 01/12/2024 11:03:20 am T: 01/12/2024 11:08:00 am  JOB: [id number]/ [id number]

## 2024-01-12 NOTE — Anesthesia Postprocedure Evaluation (Signed)
 Anesthesia Post Note  Patient: Willie Dominguez  Procedure(s) Performed: ARTHROSCOPY, KNEE, WITH MEDIAL MENISCECTOMY (Right: Knee)     Patient location during evaluation: PACU Anesthesia Type: General Level of consciousness: awake and alert Pain management: pain level controlled Vital Signs Assessment: post-procedure vital signs reviewed and stable Respiratory status: spontaneous breathing, nonlabored ventilation, respiratory function stable and patient connected to nasal cannula oxygen Cardiovascular status: blood pressure returned to baseline and stable Postop Assessment: no apparent nausea or vomiting Anesthetic complications: no   No notable events documented.  Last Vitals:  Vitals:   01/12/24 1230 01/12/24 1245  BP: (!) 140/80 (!) 146/78  Pulse: (!) 58 (!) 57  Resp: 13 12  Temp:  36.4 C  SpO2: 97% 97%    Last Pain:  Vitals:   01/12/24 1245  TempSrc:   PainSc: 4                  Rome Ade
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
## 2024-01-12 NOTE — Transfer of Care (Signed)
 Immediate Anesthesia Transfer of Care Note  Patient: Willie Dominguez  Procedure(s) Performed: ARTHROSCOPY, KNEE, WITH MEDIAL MENISCECTOMY (Right: Knee)  Patient Location: PACU  Anesthesia Type:General  Level of Consciousness: awake and patient cooperative  Airway & Oxygen Therapy: Patient Spontanous Breathing and Patient connected to face mask oxygen  Post-op Assessment: Report given to RN, Post -op Vital signs reviewed and stable, and Patient moving all extremities  Post vital signs: Reviewed and stable  Last Vitals:  Vitals Value Taken Time  BP 130/76 01/12/24 11:09  Temp    Pulse 62 01/12/24 11:13  Resp 13 01/12/24 11:13  SpO2 100 % 01/12/24 11:13  Vitals shown include unfiled device data.  Last Pain:  Vitals:   01/12/24 0848  TempSrc:   PainSc: 5          Complications: No notable events documented.
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
## 2024-01-13 ENCOUNTER — Encounter (HOSPITAL_COMMUNITY): Payer: Self-pay | Admitting: Specialist

## 2024-01-17 ENCOUNTER — Other Ambulatory Visit: Payer: Self-pay | Admitting: Family Medicine

## 2024-02-04 ENCOUNTER — Encounter: Payer: Self-pay | Admitting: Family Medicine

## 2024-02-04 ENCOUNTER — Ambulatory Visit: Admitting: Pulmonary Disease

## 2024-02-04 ENCOUNTER — Ambulatory Visit: Admitting: Family Medicine

## 2024-02-04 VITALS — BP 90/56 | HR 77 | Temp 98.1°F | Ht 72.0 in | Wt 243.6 lb

## 2024-02-04 DIAGNOSIS — I959 Hypotension, unspecified: Secondary | ICD-10-CM

## 2024-02-04 DIAGNOSIS — K219 Gastro-esophageal reflux disease without esophagitis: Secondary | ICD-10-CM | POA: Diagnosis not present

## 2024-02-04 DIAGNOSIS — R1030 Lower abdominal pain, unspecified: Secondary | ICD-10-CM | POA: Diagnosis not present

## 2024-02-04 MED ORDER — OMEPRAZOLE 40 MG PO CPDR: 40.0000 mg | DELAYED_RELEASE_CAPSULE | Freq: Every day | ORAL | 3 refills | Status: AC

## 2024-02-04 NOTE — Progress Notes (Signed)
   Subjective:    Patient ID: Willie Dominguez, male    DOB: 07-07-1950, 73 y.o.   MRN: [id number]  HPI Here for several issues. First he has frequently felt lightheaded when he stands up for the past month or so. No dizziness. He also has had bowel problems off and on for the past month. He has had frequent indigestion, and whenever he eats something he immediately has to have a BM. His stools have been alternating soft and hard. He varies from passing one stool every 2 days to passing 3 stools in one day. He gets mild lower abdominal cramps most days. No fever. He feels better when he takes Pepto-Bismol.    Review of Systems  Constitutional: Negative.   Respiratory: Negative.    Cardiovascular: Negative.   Gastrointestinal:  Positive for abdominal pain and constipation. Negative for abdominal distention, blood in stool, diarrhea, nausea, rectal pain and vomiting.  Neurological:  Positive for light-headedness. Negative for dizziness.       Objective:   Physical Exam Constitutional:      Comments: Walks with a cane   Cardiovascular:     Rate and Rhythm: Normal rate and regular rhythm.     Pulses: Normal pulses.     Heart sounds: Normal heart sounds.  Pulmonary:     Effort: Pulmonary effort is normal.     Breath sounds: Normal breath sounds.  Abdominal:     General: Abdomen is flat. Bowel sounds are normal. There is no distension.     Palpations: Abdomen is soft. There is no mass.     Tenderness: There is no right CVA tenderness, left CVA tenderness, guarding or rebound.     Hernia: No hernia is present.     Comments: Slightly tender across the lower abdomen   Musculoskeletal:     Right lower leg: No edema.     Left lower leg: No edema.  Neurological:     Mental Status: He is alert.           Assessment & Plan:  His lightheadedness is likely to be from hypotension. We will stop the Lasix , and he will follow his BP at home. His GI issues may be from a combination of GERD  and IBS. He will try taking Omeprazole  40 mg every morning.  Garnette Olmsted, MD

## 2024-02-11 ENCOUNTER — Telehealth: Payer: Self-pay | Admitting: Adult Health

## 2024-02-11 NOTE — Telephone Encounter (Signed)
 Received fax from [facility]- Dr Bonner  Pt is scheduled for epidural steroid injection and needs ok to come off of Xarelto  3 days prior. Tammy, please advise, thanks!

## 2024-02-11 NOTE — Telephone Encounter (Signed)
 Yes on established chronic anticoagulation therapy. That is fine to be off Xarelto  3 days prior to epidural steroid injection per protocol.

## 2024-02-17 NOTE — Telephone Encounter (Signed)
 Copy of this encounter faxed to [facility].

## 2024-03-30 ENCOUNTER — Telehealth: Payer: Self-pay

## 2024-03-30 ENCOUNTER — Inpatient Hospital Stay: Admitting: Hematology and Oncology

## 2024-03-30 ENCOUNTER — Telehealth: Payer: Self-pay | Admitting: Gastroenterology

## 2024-03-30 ENCOUNTER — Encounter: Payer: Self-pay | Admitting: Hematology and Oncology

## 2024-03-30 ENCOUNTER — Inpatient Hospital Stay: Attending: Hematology and Oncology

## 2024-03-30 VITALS — BP 132/89 | HR 63 | Temp 98.4°F | Resp 18 | Ht 72.0 in | Wt 235.2 lb

## 2024-03-30 DIAGNOSIS — D61818 Other pancytopenia: Secondary | ICD-10-CM

## 2024-03-30 LAB — CBC WITH DIFFERENTIAL (CANCER CENTER ONLY)
Abs Immature Granulocytes: 0.01 10*3/uL (ref 0.00–0.07)
Basophils Absolute: 0 10*3/uL (ref 0.0–0.1)
Basophils Relative: 1 %
Eosinophils Absolute: 0.1 10*3/uL (ref 0.0–0.5)
Eosinophils Relative: 2 %
HCT: 44 % (ref 39.0–52.0)
Hemoglobin: 15.1 g/dL (ref 13.0–17.0)
Immature Granulocytes: 0 %
Lymphocytes Relative: 22 %
Lymphs Abs: 0.9 10*3/uL (ref 0.7–4.0)
MCH: 30.6 pg (ref 26.0–34.0)
MCHC: 34.3 g/dL (ref 30.0–36.0)
MCV: 89.2 fL (ref 80.0–100.0)
Monocytes Absolute: 0.4 10*3/uL (ref 0.1–1.0)
Monocytes Relative: 11 %
Neutro Abs: 2.5 10*3/uL (ref 1.7–7.7)
Neutrophils Relative %: 64 %
Platelet Count: 105 10*3/uL — ABNORMAL LOW (ref 150–400)
RBC: 4.93 MIL/uL (ref 4.22–5.81)
RDW: 13.2 % (ref 11.5–15.5)
WBC Count: 3.9 10*3/uL — ABNORMAL LOW (ref 4.0–10.5)
nRBC: 0 % (ref 0.0–0.2)

## 2024-03-30 NOTE — Assessment & Plan Note (Addendum)
 The patient is noted to have intermittent pancytopenia for very long time Previously, he had a positive autoimmune screen However, repeat autoimmune screen, rheumatoid factor and anti-CCP all came back negative He is completely asymptomatic We discussed all test results that came back unremarkable He has no signs of bleeding He can continue anticoagulation therapy as long as his platelet count remain over 50  We discussed risk and benefits of bone marrow biopsy but the patient currently agrees to defer due to lack of symptoms which I think is reasonable I will see him in 1 year

## 2024-03-30 NOTE — Progress Notes (Signed)
 [facility] OFFICE PROGRESS NOTE  Patient Care Team: [name] LABOR, MD as PCP - General (Family Medicine) Sheldon Standing, MD as Consulting Physician (General Surgery) Elmira Newman PARAS, MD as Consulting Physician (Cardiology) Kara Dorn NOVAK, MD as Consulting Physician (Pulmonary Disease) Mansouraty, Aloha Raddle., MD as Consulting Physician (Gastroenterology)  Assessment & Plan Pancytopenia, acquired [facility]) The patient is noted to have intermittent pancytopenia for very long time Previously, he had a positive autoimmune screen However, repeat autoimmune screen, rheumatoid factor and anti-CCP all came back negative He is completely asymptomatic We discussed all test results that came back unremarkable He has no signs of bleeding He can continue anticoagulation therapy as long as his platelet count remain over 50  We discussed risk and benefits of bone marrow biopsy but the patient currently agrees to defer due to lack of symptoms which I think is reasonable I will see him in 1 year  No orders of the defined types were placed in this encounter.    [name], MD  INTERVAL HISTORY: he returns for surveillance follow-up for pancytopenia and history of recurrent pulmonary emboli The patient denies any recent signs or symptoms of bleeding such as spontaneous epistaxis, hematuria or hematochezia.   PHYSICAL EXAMINATION: ECOG PERFORMANCE STATUS: 0 - Asymptomatic  Vitals:   03/30/24 1038  BP: 132/89  Pulse: 63  Resp: 18  Temp: 98.4 F (36.9 C)  SpO2: 94%   Filed Weights   03/30/24 1038  Weight: 235 lb 3.2 oz (106.7 kg)    Relevant data reviewed during this visit included CBC

## 2024-03-30 NOTE — Telephone Encounter (Signed)
 Good afternoon Dr. Wilhelmenia,   I received a call form this patient stating that he received a message on his MyChart stating that it was time to scheduled an appointment. Looking into the patient chart he did received a message to his portal stating that he is due for a hospital colonoscopy. Patient is wanting to confirm the location of where his procedure needs to take place. Would you please advise.    Thank you.

## 2024-03-30 NOTE — Telephone Encounter (Signed)
 Copied from CRM #[id number]. Topic: Clinical - Medication Question >> Mar 30, 2024  3:22 PM Sasha M wrote: Reason for CRM: Pt initially called to see if he can get assistance getting the injector needles for his testosterone  because he is having trouble with the pharmacy having a low supply. He is interested in getting the 1 inch 23 gauge syringe because the kind he is using is a little difficult but also expressed interest in an auto injector if one is available. I informed him that he may have better luck with a mail order pharmacy (if its covered under his medicare). Also he wanted to note that he seen Dr Lonn today and she/he made note that pts hemoglobin is a little high and it may be because of the testosterone  so to keep an eye on it. I offered to schedule an appt because the pt was supposed to have been seen for a follow up after starting testosterone  and he will be in tomorrow to discuss things further

## 2024-03-31 ENCOUNTER — Encounter: Payer: Self-pay | Admitting: Family Medicine

## 2024-03-31 ENCOUNTER — Ambulatory Visit: Admitting: Family Medicine

## 2024-03-31 VITALS — BP 124/80 | HR 58 | Temp 98.7°F | Wt 235.0 lb

## 2024-03-31 DIAGNOSIS — E291 Testicular hypofunction: Secondary | ICD-10-CM | POA: Diagnosis not present

## 2024-03-31 DIAGNOSIS — D61818 Other pancytopenia: Secondary | ICD-10-CM | POA: Diagnosis not present

## 2024-03-31 MED ORDER — "BD DISP NEEDLES 18G X 1-1/2"" MISC": 1.0000 | 3 refills | Status: AC

## 2024-03-31 MED ORDER — "SYRINGE LUER LOCK 23G X 1"" 3 ML MISC": 1.0000 | 3 refills | Status: AC

## 2024-03-31 NOTE — Progress Notes (Signed)
" ° °  Subjective:    Patient ID: Willie Dominguez, male    DOB: 1950/06/02, 74 y.o.   MRN: [id number]  HPI Here to ask about switching to a different needle when he injects his testosterone . He wants a larger needle to draw it up from the bottle and a smaller one to inject himself with. He also wants to go over lab results from Dr. Lonn yesterday. She got a CBC which showed the Hgb has gone up to 15.1 (from 13.6 in November). The WBC and platelet counts have been basically stable. She thinks the bump in Hgb is from the testosterone  he is taking.    Review of Systems  Constitutional: Negative.   Respiratory: Negative.    Cardiovascular: Negative.        Objective:   Physical Exam Constitutional:      Appearance: Normal appearance.  Cardiovascular:     Rate and Rhythm: Normal rate and regular rhythm.     Pulses: Normal pulses.     Heart sounds: Normal heart sounds.  Pulmonary:     Effort: Pulmonary effort is normal.     Breath sounds: Normal breath sounds.  Neurological:     Mental Status: He is alert.           Assessment & Plan:  For the hypogonadism, we will keep the testosterone  dosing the same, but he will now draw it up with an 18 gauge needle and he will administer it with a 23 gauge needle. For the pancytopenia, the anemia has improved as an effect of the testosterone . We will check this again in 6 months to make sure the HGB does not go too high. Garnette Olmsted, MD   "

## 2024-04-01 NOTE — Telephone Encounter (Signed)
 Colonoscopy recall EMR 75 minutes when able in the next few months.  Thanks. GM

## 2024-04-05 ENCOUNTER — Other Ambulatory Visit: Payer: Self-pay

## 2024-04-05 ENCOUNTER — Telehealth: Payer: Self-pay

## 2024-04-05 DIAGNOSIS — K635 Polyp of colon: Secondary | ICD-10-CM

## 2024-04-05 MED ORDER — NA SULFATE-K SULFATE-MG SULF 17.5-3.13-1.6 GM/177ML PO SOLN: 1.0000 | Freq: Once | ORAL | 0 refills | Status: AC

## 2024-04-05 NOTE — Telephone Encounter (Signed)
 Colon EMR set up for 05/09/24 at [facility] at 730 am with GM   Colon EMR scheduled, pt instructed and medications reviewed.  Patient instructions mailed to home.  Patient to call with any questions or concerns.   Pt aware to pick up prep within 1 week,

## 2024-04-05 NOTE — Telephone Encounter (Signed)
 Xarelto  hold request for 05/09/24 colon EMR  Request sent to Dr Kara

## 2024-04-06 ENCOUNTER — Telehealth: Payer: Self-pay | Admitting: Pulmonary Disease

## 2024-04-06 NOTE — Telephone Encounter (Signed)
 Fax received from [facility] to perform a endoscopic procedure on patient.  Procedure is 05/09/24. Patient was seen on 01/03/24. Office protocol is a risk assessment can be sent to surgeon if patient has been seen in 60 days or less.   Tammy- they are asking if ok to hold xarelto  prior to and if so how long?

## 2024-05-08 ENCOUNTER — Ambulatory Visit: Admitting: Cardiology

## 2024-05-09 ENCOUNTER — Ambulatory Visit (HOSPITAL_COMMUNITY): Admit: 2024-05-09 | Admitting: Gastroenterology

## 2024-05-09 ENCOUNTER — Encounter (HOSPITAL_COMMUNITY): Payer: Self-pay

## 2024-05-15 ENCOUNTER — Ambulatory Visit: Admitting: Pulmonary Disease

## 2025-03-30 ENCOUNTER — Inpatient Hospital Stay: Admitting: Hematology and Oncology

## 2025-03-30 ENCOUNTER — Inpatient Hospital Stay
